# Patient Record
Sex: Female | Born: 1975
Health system: Southern US, Community
[De-identification: ages and names within clinical notes are randomized; demographics above are authoritative.]

## PROBLEM LIST (undated history)

## (undated) ENCOUNTER — Inpatient Hospital Stay (HOSPITAL_COMMUNITY): Payer: Self-pay

## (undated) DIAGNOSIS — B977 Papillomavirus as the cause of diseases classified elsewhere: Secondary | ICD-10-CM

## (undated) DIAGNOSIS — I499 Cardiac arrhythmia, unspecified: Secondary | ICD-10-CM

## (undated) DIAGNOSIS — E785 Hyperlipidemia, unspecified: Secondary | ICD-10-CM

## (undated) DIAGNOSIS — I10 Essential (primary) hypertension: Secondary | ICD-10-CM

## (undated) DIAGNOSIS — Z993 Dependence on wheelchair: Secondary | ICD-10-CM

## (undated) DIAGNOSIS — R2681 Unsteadiness on feet: Secondary | ICD-10-CM

## (undated) DIAGNOSIS — O039 Complete or unspecified spontaneous abortion without complication: Secondary | ICD-10-CM

## (undated) DIAGNOSIS — K219 Gastro-esophageal reflux disease without esophagitis: Secondary | ICD-10-CM

## (undated) DIAGNOSIS — I471 Supraventricular tachycardia, unspecified: Secondary | ICD-10-CM

## (undated) HISTORY — DX: Hyperlipidemia, unspecified: E78.5

## (undated) HISTORY — DX: Essential (primary) hypertension: I10

## (undated) HISTORY — PX: NM MYOCAR PERF WALL MOTION: HXRAD629

## (undated) HISTORY — DX: Gastro-esophageal reflux disease without esophagitis: K21.9

## (undated) HISTORY — PX: WISDOM TOOTH EXTRACTION: SHX21

## (undated) HISTORY — PX: SPINE SURGERY: SHX786

## (undated) HISTORY — DX: Papillomavirus as the cause of diseases classified elsewhere: B97.7

## (undated) HISTORY — DX: Supraventricular tachycardia, unspecified: I47.10

## (undated) HISTORY — DX: Unsteadiness on feet: R26.81

## (undated) HISTORY — DX: Supraventricular tachycardia: I47.1

---

## 1997-01-05 HISTORY — PX: BREAST SURGERY: SHX581

## 2000-03-07 ENCOUNTER — Emergency Department (HOSPITAL_COMMUNITY): Admission: EM | Admit: 2000-03-07 | Discharge: 2000-03-07 | Payer: Self-pay | Admitting: Emergency Medicine

## 2000-03-30 ENCOUNTER — Other Ambulatory Visit: Admission: RE | Admit: 2000-03-30 | Discharge: 2000-03-30 | Payer: Self-pay | Admitting: *Deleted

## 2002-10-01 ENCOUNTER — Emergency Department (HOSPITAL_COMMUNITY): Admission: EM | Admit: 2002-10-01 | Discharge: 2002-10-01 | Payer: Self-pay

## 2003-09-27 ENCOUNTER — Encounter: Admission: RE | Admit: 2003-09-27 | Discharge: 2003-12-26 | Payer: Self-pay | Admitting: Family Medicine

## 2004-02-12 ENCOUNTER — Emergency Department (HOSPITAL_COMMUNITY): Admission: EM | Admit: 2004-02-12 | Discharge: 2004-02-12 | Payer: Self-pay | Admitting: Family Medicine

## 2004-03-02 ENCOUNTER — Emergency Department (HOSPITAL_COMMUNITY): Admission: EM | Admit: 2004-03-02 | Discharge: 2004-03-02 | Payer: Self-pay | Admitting: Family Medicine

## 2004-06-18 ENCOUNTER — Other Ambulatory Visit: Admission: RE | Admit: 2004-06-18 | Discharge: 2004-06-18 | Payer: Self-pay | Admitting: Obstetrics and Gynecology

## 2004-09-15 ENCOUNTER — Emergency Department (HOSPITAL_COMMUNITY): Admission: EM | Admit: 2004-09-15 | Discharge: 2004-09-15 | Payer: Self-pay | Admitting: Family Medicine

## 2004-09-22 ENCOUNTER — Emergency Department (HOSPITAL_COMMUNITY): Admission: EM | Admit: 2004-09-22 | Discharge: 2004-09-22 | Payer: Self-pay | Admitting: Family Medicine

## 2004-12-22 ENCOUNTER — Emergency Department (HOSPITAL_COMMUNITY): Admission: EM | Admit: 2004-12-22 | Discharge: 2004-12-22 | Payer: Self-pay | Admitting: Emergency Medicine

## 2005-07-07 ENCOUNTER — Other Ambulatory Visit: Admission: RE | Admit: 2005-07-07 | Discharge: 2005-07-07 | Payer: Self-pay | Admitting: Obstetrics and Gynecology

## 2005-11-28 ENCOUNTER — Emergency Department (HOSPITAL_COMMUNITY): Admission: EM | Admit: 2005-11-28 | Discharge: 2005-11-28 | Payer: Self-pay | Admitting: Family Medicine

## 2007-01-07 ENCOUNTER — Emergency Department (HOSPITAL_COMMUNITY): Admission: EM | Admit: 2007-01-07 | Discharge: 2007-01-07 | Payer: Self-pay | Admitting: Family Medicine

## 2007-08-15 ENCOUNTER — Emergency Department (HOSPITAL_COMMUNITY): Admission: EM | Admit: 2007-08-15 | Discharge: 2007-08-16 | Payer: Self-pay | Admitting: Emergency Medicine

## 2007-12-03 ENCOUNTER — Emergency Department (HOSPITAL_COMMUNITY): Admission: EM | Admit: 2007-12-03 | Discharge: 2007-12-04 | Payer: Self-pay | Admitting: Emergency Medicine

## 2008-05-13 ENCOUNTER — Emergency Department (HOSPITAL_COMMUNITY): Admission: EM | Admit: 2008-05-13 | Discharge: 2008-05-13 | Payer: Self-pay | Admitting: Emergency Medicine

## 2008-05-23 ENCOUNTER — Emergency Department (HOSPITAL_COMMUNITY): Admission: EM | Admit: 2008-05-23 | Discharge: 2008-05-23 | Payer: Self-pay | Admitting: Family Medicine

## 2008-06-04 ENCOUNTER — Emergency Department (HOSPITAL_COMMUNITY): Admission: EM | Admit: 2008-06-04 | Discharge: 2008-06-04 | Payer: Self-pay | Admitting: Emergency Medicine

## 2008-06-12 ENCOUNTER — Emergency Department (HOSPITAL_COMMUNITY): Admission: EM | Admit: 2008-06-12 | Discharge: 2008-06-12 | Payer: Self-pay | Admitting: Emergency Medicine

## 2008-07-05 HISTORY — PX: DOPPLER ECHOCARDIOGRAPHY: SHX263

## 2008-07-10 ENCOUNTER — Emergency Department (HOSPITAL_COMMUNITY): Admission: EM | Admit: 2008-07-10 | Discharge: 2008-07-10 | Payer: Self-pay | Admitting: Family Medicine

## 2008-07-22 ENCOUNTER — Emergency Department (HOSPITAL_COMMUNITY): Admission: EM | Admit: 2008-07-22 | Discharge: 2008-07-22 | Payer: Self-pay | Admitting: Emergency Medicine

## 2008-08-19 ENCOUNTER — Emergency Department (HOSPITAL_COMMUNITY): Admission: EM | Admit: 2008-08-19 | Discharge: 2008-08-19 | Payer: Self-pay | Admitting: Emergency Medicine

## 2008-11-11 ENCOUNTER — Emergency Department (HOSPITAL_COMMUNITY): Admission: EM | Admit: 2008-11-11 | Discharge: 2008-11-11 | Payer: Self-pay | Admitting: Emergency Medicine

## 2009-01-05 ENCOUNTER — Emergency Department (HOSPITAL_COMMUNITY): Admission: EM | Admit: 2009-01-05 | Discharge: 2009-01-06 | Payer: Self-pay | Admitting: Emergency Medicine

## 2009-05-30 ENCOUNTER — Emergency Department (HOSPITAL_COMMUNITY)
Admission: EM | Admit: 2009-05-30 | Discharge: 2009-05-30 | Payer: Self-pay | Source: Home / Self Care | Admitting: Emergency Medicine

## 2009-06-04 ENCOUNTER — Encounter: Admission: RE | Admit: 2009-06-04 | Discharge: 2009-09-02 | Payer: Self-pay | Admitting: Internal Medicine

## 2009-07-12 DIAGNOSIS — E785 Hyperlipidemia, unspecified: Secondary | ICD-10-CM | POA: Insufficient documentation

## 2009-07-12 DIAGNOSIS — B977 Papillomavirus as the cause of diseases classified elsewhere: Secondary | ICD-10-CM | POA: Insufficient documentation

## 2009-07-22 ENCOUNTER — Encounter (INDEPENDENT_AMBULATORY_CARE_PROVIDER_SITE_OTHER): Payer: Self-pay | Admitting: *Deleted

## 2009-09-03 ENCOUNTER — Encounter: Admission: RE | Admit: 2009-09-03 | Discharge: 2009-10-04 | Payer: Self-pay | Admitting: Internal Medicine

## 2009-10-15 ENCOUNTER — Ambulatory Visit: Payer: Self-pay | Admitting: Internal Medicine

## 2009-10-15 DIAGNOSIS — G43909 Migraine, unspecified, not intractable, without status migrainosus: Secondary | ICD-10-CM | POA: Insufficient documentation

## 2009-10-15 DIAGNOSIS — E669 Obesity, unspecified: Secondary | ICD-10-CM | POA: Insufficient documentation

## 2009-10-15 DIAGNOSIS — K219 Gastro-esophageal reflux disease without esophagitis: Secondary | ICD-10-CM | POA: Insufficient documentation

## 2009-10-28 ENCOUNTER — Ambulatory Visit: Payer: Self-pay | Admitting: Internal Medicine

## 2009-10-28 LAB — CONVERTED CEMR LAB
BUN: 10 mg/dL (ref 6–23)
Basophils Relative: 0.4 % (ref 0.0–3.0)
Creatinine, Ser: 0.7 mg/dL (ref 0.4–1.2)
Eosinophils Relative: 1 % (ref 0.0–5.0)
Glucose, Bld: 83 mg/dL (ref 70–99)
HCT: 35.7 % — ABNORMAL LOW (ref 36.0–46.0)
HDL: 33.3 mg/dL — ABNORMAL LOW (ref >39.00)
Lymphocytes Relative: 27 % (ref 12.0–46.0)
MCHC: 34.4 g/dL (ref 30.0–36.0)
MCV: 97 fL (ref 78.0–100.0)
Monocytes Absolute: 0.4 10*3/uL (ref 0.1–1.0)
Platelets: 179 10*3/uL (ref 150.0–400.0)
RBC: 3.68 M/uL — ABNORMAL LOW (ref 3.87–5.11)
Sodium: 137 meq/L (ref 135–145)
Total CHOL/HDL Ratio: 4
Triglycerides: 43 mg/dL (ref 0.0–149.0)
VLDL: 8.6 mg/dL (ref 0.0–40.0)
WBC: 5.9 10*3/uL (ref 4.5–10.5)

## 2009-10-30 ENCOUNTER — Ambulatory Visit: Payer: Self-pay | Admitting: Internal Medicine

## 2009-10-30 LAB — CONVERTED CEMR LAB: Vit D, 25-Hydroxy: 22 ng/mL — ABNORMAL LOW (ref 30–89)

## 2009-11-04 ENCOUNTER — Encounter: Payer: Self-pay | Admitting: Internal Medicine

## 2009-11-26 IMAGING — CT CT HEAD W/O CM
1 series · 16 of 30 positions shown, 20 images · non-contrast
Comparison: None available.

CLINICAL DATA: Right facial numbness and tingling.

CT HEAD WITHOUT CONTRAST
TECHNIQUE: Contiguous axial images were obtained from the base of
the skull through the vertex without contrast.

[Series 2: head_seq 4.5 h37s st · axial · 0.43mm/px · z∈[+1134,+1278]mm · 16 of 36 slices shown, 20 images]
[im 2/36  brain]
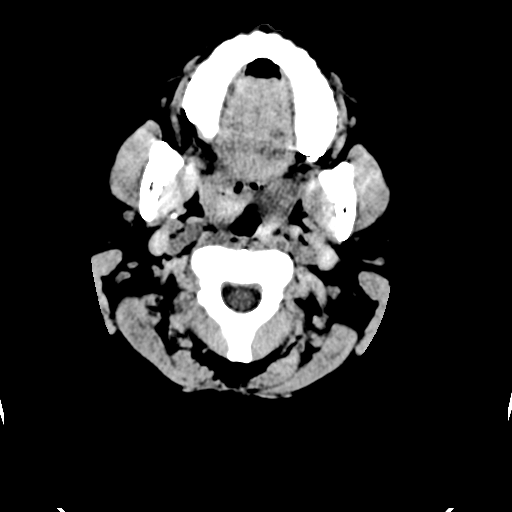
[im 2/36  bone]
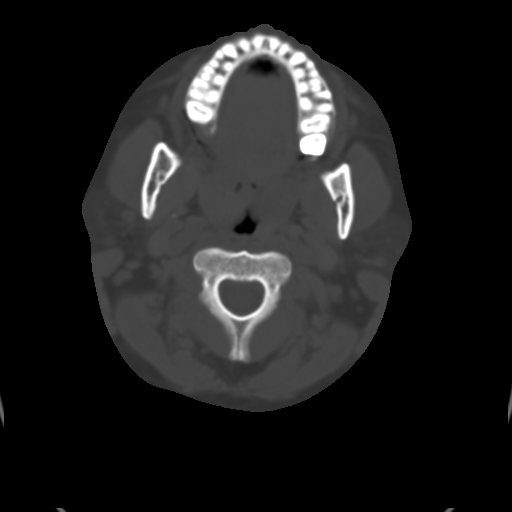
[im 4/36  brain]
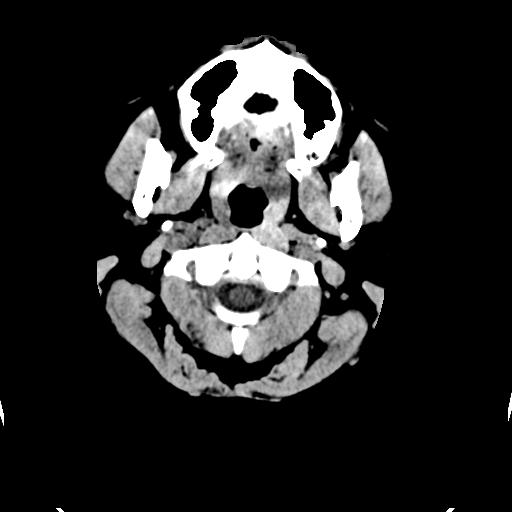
[im 7/36  brain]
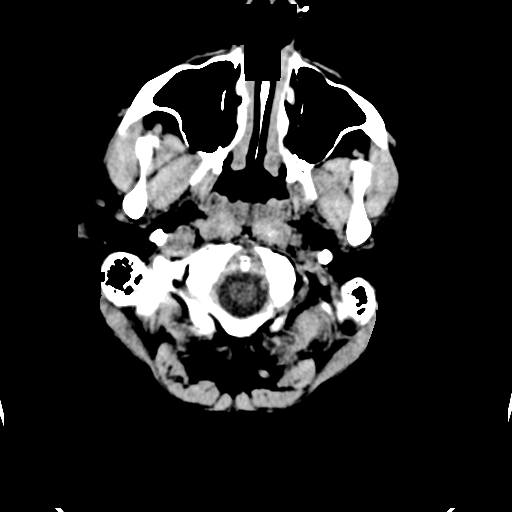
[im 9/36  brain]
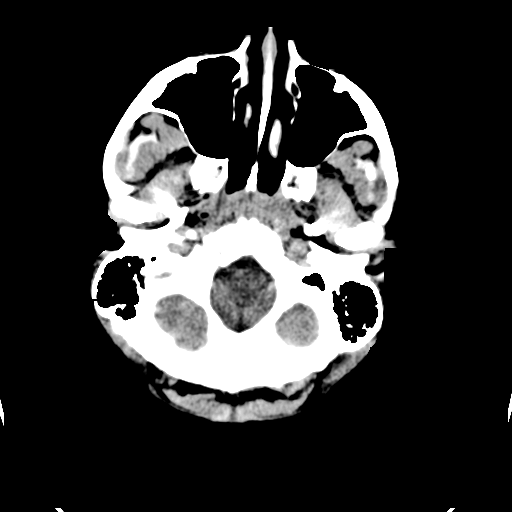
[im 10/36  brain]
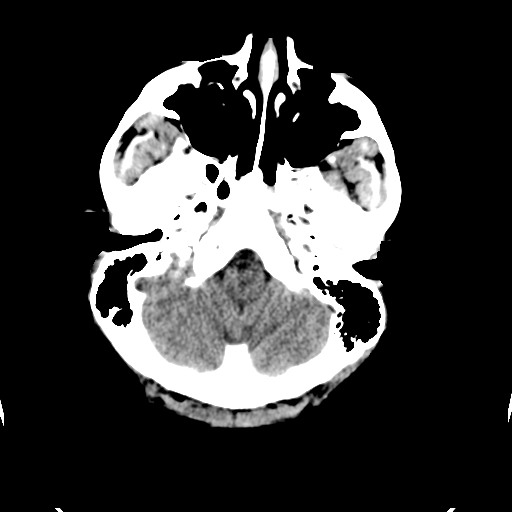
[im 10/36  bone]
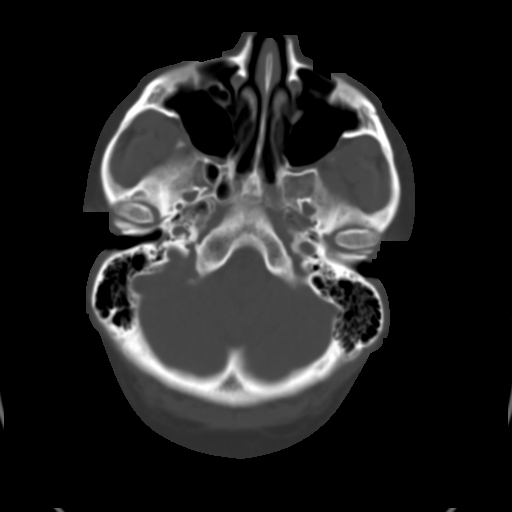
[im 13/36  brain]
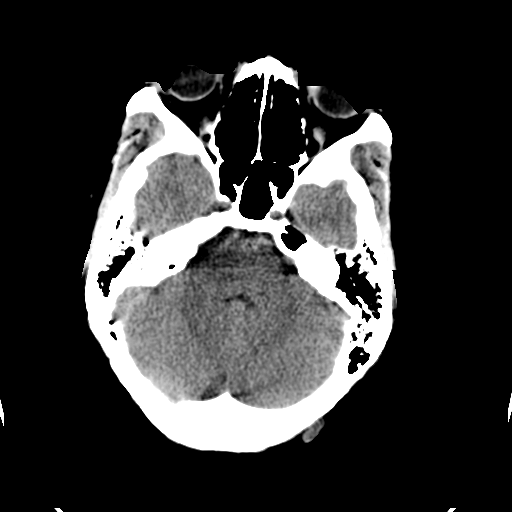
[im 15/36  brain]
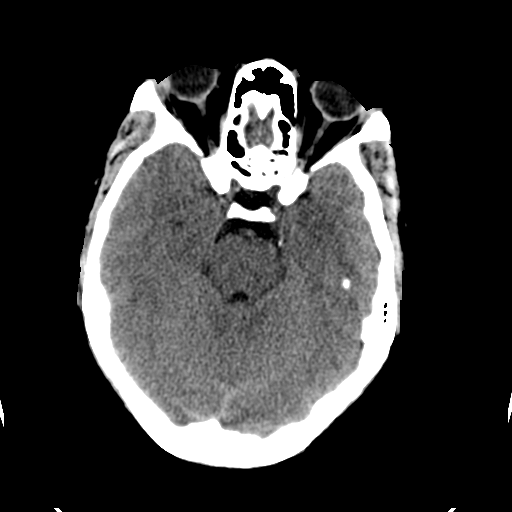
[im 17/36  brain]
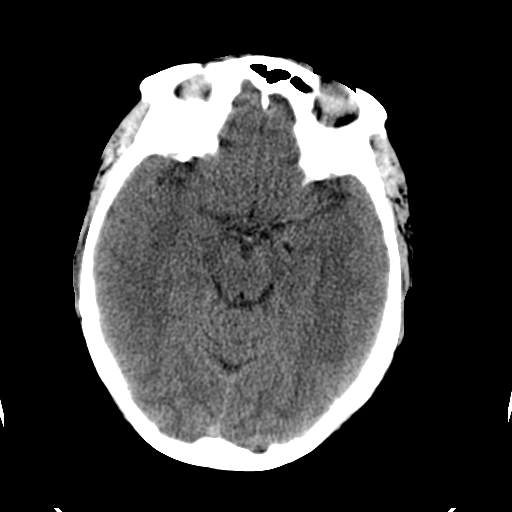
[im 19/36  brain]
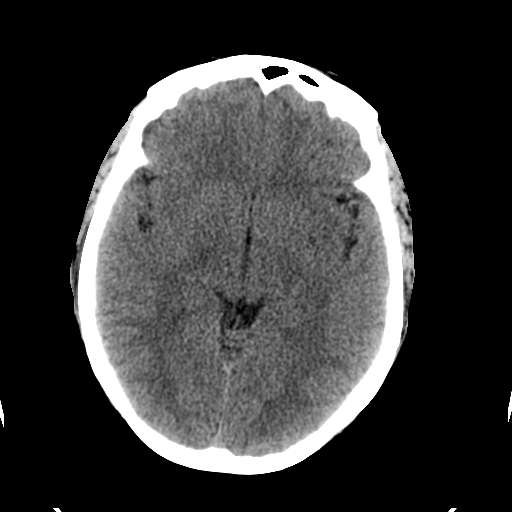
[im 19/36  bone]
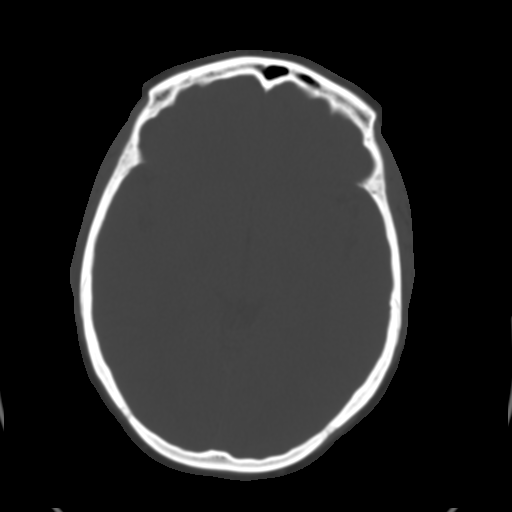
[im 21/36  brain]
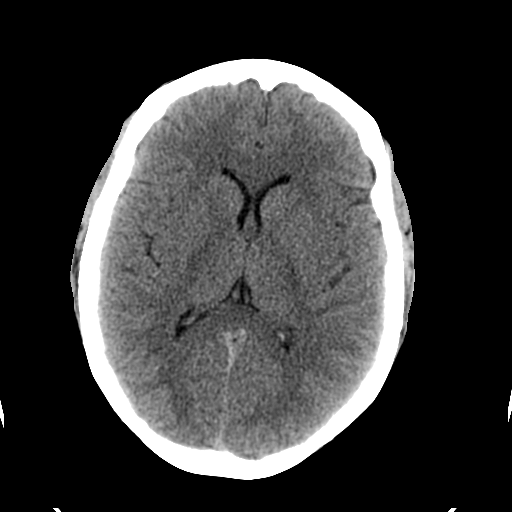
[im 23/36  brain]
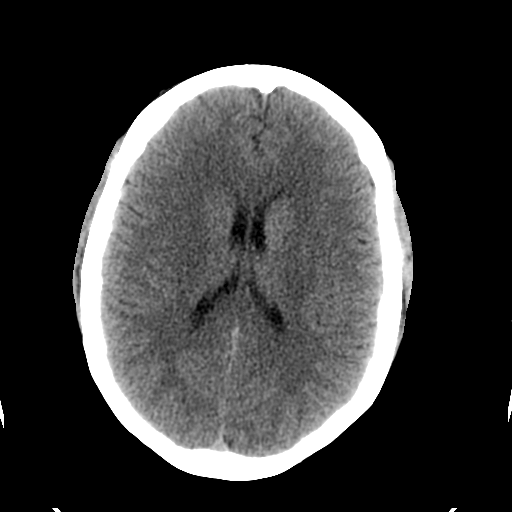
[im 26/36  brain]
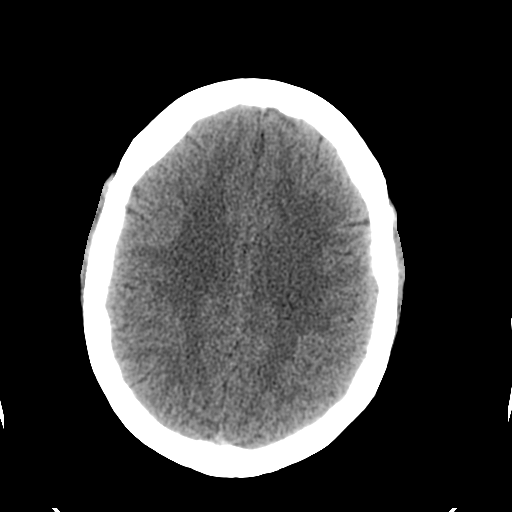
[im 27/36  brain]
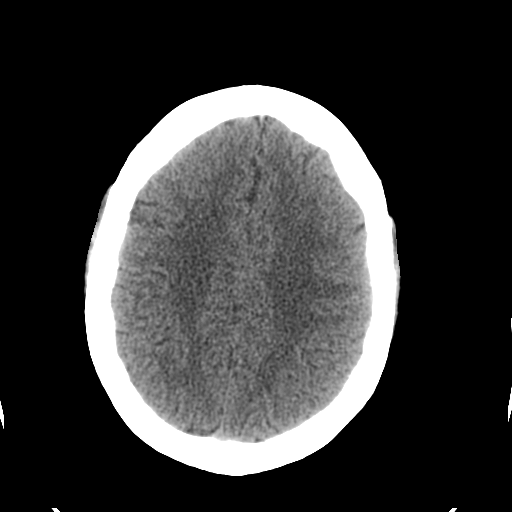
[im 27/36  bone]
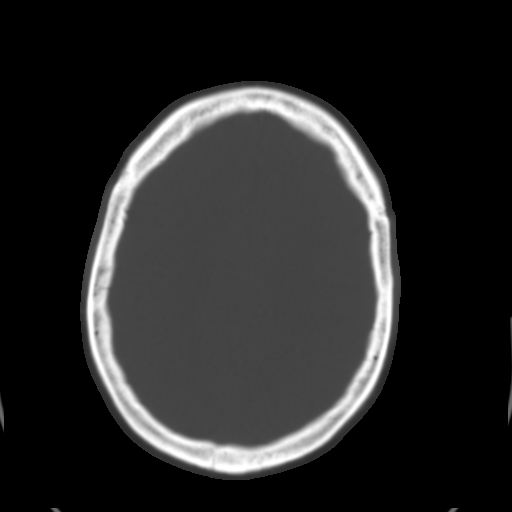
[im 29/36  brain]
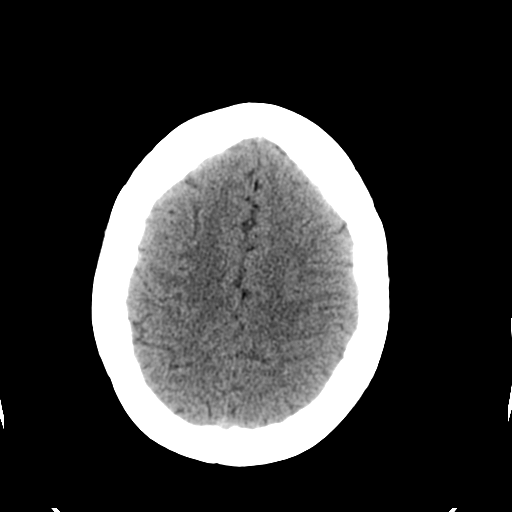
[im 32/36  brain]
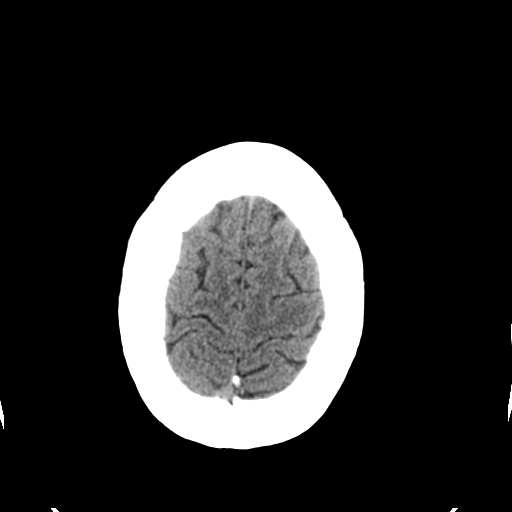
[im 34/36  brain]
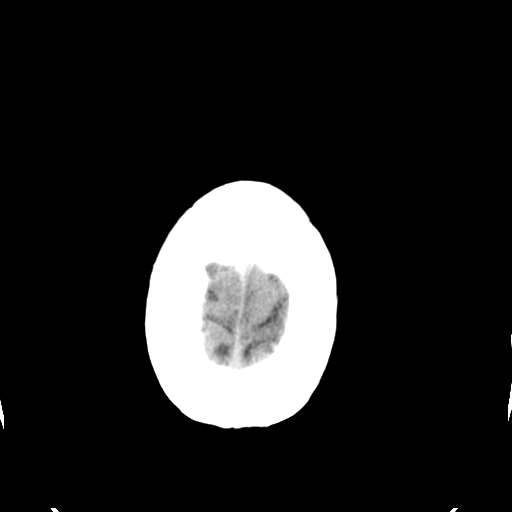

[16 of 30 positions shown; findings below may reference images not displayed]

FINDINGS: The brain appears normal without evidence of hemorrhage,
infarct, mass, mass effect, midline shift or abnormal extra-axial
fluid collection.  There is no hydrocephalus.  Imaged paranasal
sinuses and mastoid air cells are clear.  Calvarium is intact.
IMPRESSION: Negative head CT.

## 2010-01-13 ENCOUNTER — Telehealth: Payer: Self-pay | Admitting: Internal Medicine

## 2010-01-15 ENCOUNTER — Encounter: Payer: Self-pay | Admitting: Internal Medicine

## 2010-01-31 ENCOUNTER — Ambulatory Visit
Admission: RE | Admit: 2010-01-31 | Discharge: 2010-01-31 | Payer: Self-pay | Source: Home / Self Care | Attending: Internal Medicine | Admitting: Internal Medicine

## 2010-01-31 LAB — CONVERTED CEMR LAB
Bilirubin Urine: NEGATIVE
Protein, U semiquant: 30
Urobilinogen, UA: NEGATIVE

## 2010-02-06 NOTE — Progress Notes (Signed)
Summary: Note---SCAT  Phone Note Call from Patient   Caller: Patient Summary of Call: Pt is requesting a note stating she is in a wheelchair for SCAT --Pt ph#  [phone] Initial call taken by: Ivar Bury,  January 13, 2010 3:56 PM  Follow-up for Phone Call        done. letter a triage Follow-up by: Jacques Navy MD,  January 15, 2010 1:03 PM  Additional Follow-up for Phone Call Additional follow up Details #1::        Pt informed to pick up Additional Follow-up by: Lamar Sprinkles, CMA,  January 15, 2010 2:37 PM

## 2010-02-06 NOTE — Assessment & Plan Note (Signed)
Summary: urine leakage/?bladder inf/cd   Vital Signs:  Patient profile:   35 year old female Temp:     97.6 degrees F Pulse rate:   69 / minute BP sitting:   122 / 80  (left arm) Cuff size:   regular  Vitals Entered By: Lamar Sprinkles, CMA (January 31, 2010 3:06 PM) CC: Urinary frequency & leakage - has had eval by GYN   Primary Care Provider:  Illene Regulus  CC:  Urinary frequency & leakage - has had eval by GYN.  History of Present Illness: Patient presents due to increased urinary frequency  and some pressure discomfort at the flanks.She recently saw her Gyn Jan 2nd - negative u/a. Had pelvic U/S Jan 19th positive for small fibroid. She reports that after her pelvic exam she did have an increase in her symptoms. Denies F/S/C, hematuria, dysuria. Admits to frequency and nocturia.   Current Medications (verified): 1)  Crestor 10 Mg Tabs (Rosuvastatin Calcium) .Marland Kitchen.. 1 By Mouth Once Daily 2)  Metoprolol Tartrate 50 Mg Tabs (Metoprolol Tartrate) .Marland Kitchen.. 1 1/2 Tablets (75mg ) By Mouth Once Daily 3)  Vitamin D (Ergocalciferol) 50000 Unit Caps (Ergocalciferol) .Marland Kitchen.. 1 By Mouth Weekly X 8 Weeks  Allergies (verified): No Known Drug Allergies PMH-FH-SH reviewed-no changes except otherwise noted  Review of Systems       The patient complains of incontinence.  The patient denies anorexia, fever, abdominal pain, severe indigestion/heartburn, hematuria, muscle weakness, and enlarged lymph nodes.         slight urinary leakage  Physical Exam  General:  overweight AA female w/c bound Head:  normocephalic and atraumatic.   Eyes:  C&S clear Lungs:  normal respiratory effort.   Heart:  normal rate and regular rhythm.   Neurologic:  alert & oriented X3.   Skin:  turgor normal and color normal.   Psych:  Oriented X3 and memory intact for recent and remote.     Impression & Recommendations:  Problem # 1:  URGENCY OF URINATION (ZOX-096.04) U/A today is negative and she has no signs to  suggest UTI. suspect irritible bladder.  Plan - trail of Toviaz. If no help, trial of vesicare. If no help refer to GU  Complete Medication List: 1)  Crestor 10 Mg Tabs (Rosuvastatin calcium) .Marland Kitchen.. 1 by mouth once daily 2)  Metoprolol Tartrate 50 Mg Tabs (Metoprolol tartrate) .Marland Kitchen.. 1 1/2 tablets (75mg ) by mouth once daily 3)  Vitamin D (ergocalciferol) 50000 Unit Caps (Ergocalciferol) .Marland Kitchen.. 1 by mouth weekly x 8 weeks   Orders Added: 1)  Est. Patient Level III [99213]    Laboratory Results   Urine Tests   Date/Time Reported: Lamar Sprinkles, CMA  January 31, 2010 3:12 PM   Routine Urinalysis   Appearance: Clear Glucose: negative   (Normal Range: Negative) Bilirubin: negative   (Normal Range: Negative) Ketone: negative   (Normal Range: Negative) Spec. Gravity: <1.005   (Normal Range: 1.003-1.035) Blood: trace-lysed   (Normal Range: Negative) pH: 8.0   (Normal Range: 5.0-8.0) Protein: 30   (Normal Range: Negative) Urobilinogen: negative   (Normal Range: 0-1) Nitrite: negative   (Normal Range: Negative) Leukocyte Esterace: negative   (Normal Range: Negative)

## 2010-02-06 NOTE — Assessment & Plan Note (Signed)
Summary: PER MD 2 WK FU--STC   Vital Signs:  Patient profile:   35 year old female Height:      68 inches Weight:      225 pounds BMI:     34.33 O2 Sat:      98 % on Room air Temp:     98.0 degrees F oral Pulse rate:   66 / minute BP sitting:   106 / 70  (left arm) Cuff size:   large  Vitals Entered By: Bill Salinas CMA (October 30, 2009 11:05 AM)  O2 Flow:  Room air CC: follow-up visit/ab Comments Pt states that she takes Metoprolol Succ ER 50mg  1 1/2 tabs once a day   Primary Care Provider:  Illene Regulus  CC:  follow-up visit/ab.  History of Present Illness: Patient presents for follow-up. She did review her initial history with no additions or corrections. She is also her to review labs.   Current Medications (verified): 1)  Crestor 10 Mg Tabs (Rosuvastatin Calcium) .Marland Kitchen.. 1 By Mouth Once Daily 2)  Metoprolol Tartrate 50 Mg Tabs (Metoprolol Tartrate) .Marland Kitchen.. 1 1/2 Tablets (75mg ) By Mouth Once Daily  Allergies (verified): No Known Drug Allergies PMH-FH-SH reviewed-no changes except otherwise noted  Physical Exam  General:  Well-developed,well-nourished,in no acute distress; alert,appropriate and cooperative throughout examination Neck:  supple.   Lungs:  normal respiratory effort and normal breath sounds.   Heart:  normal rate and regular rhythm.   Abdomen:  soft and normal bowel sounds.     Impression & Recommendations:  Problem # 1:  OVERWEIGHT (ICD-278.02) strongly encouraged to sign up for water based exercise  Problem # 2:  HYPERLIPIDEMIA (ICD-272.4) Great control on crestor with LDL 101. HDL is low and this can be improved with low carb diet and exercise.   Plan - continue crestor.  Her updated medication list for this problem includes:    Crestor 10 Mg Tabs (Rosuvastatin calcium) .Marland Kitchen... 1 by mouth once daily  Problem # 3:  PSVT (ICD-427.0) Stable  Her updated medication list for this problem includes:    Metoprolol Tartrate 50 Mg Tabs (Metoprolol  tartrate) .Marland Kitchen... 1 1/2 tablets (75mg ) by mouth once daily  Complete Medication List: 1)  Crestor 10 Mg Tabs (Rosuvastatin calcium) .Marland Kitchen.. 1 by mouth once daily 2)  Metoprolol Tartrate 50 Mg Tabs (Metoprolol tartrate) .Marland Kitchen.. 1 1/2 tablets (75mg ) by mouth once daily  Patient: Sara Hunt Note: All result statuses are Final unless otherwise noted.  Tests: (1) Lipid Panel (LIPID)   Cholesterol               143 mg/dL                   1-610     ATP III Classification            Desirable:  < 200 mg/dL                    Borderline High:  200 - 239 mg/dL               High:  > = 240 mg/dL   Triglycerides             43.0 mg/dL                  9.6-045.4     Normal:  <150 mg/dL     Borderline High:  098 - 199 mg/dL   HDL                  [  L]  33.30 mg/dL                 >16.10   VLDL Cholesterol          8.6 mg/dL                   9.6-04.5   LDL Cholesterol      [H]  409 mg/dL                   8-11  CHO/HDL Ratio:  CHD Risk                             4                    Men          Women     1/2 Average Risk     3.4          3.3     Average Risk          5.0          4.4     2X Average Risk          9.6          7.1     3X Average Risk          15.0          11.0                           Tests: (2) TSH (TSH)   FastTSH                   0.99 uIU/mL                 0.35-5.50  Tests: (3) BMP (METABOL)   Sodium                    137 mEq/L                   135-145   Potassium                 3.9 mEq/L                   3.5-5.1   Chloride                  104 mEq/L                   96-112   Carbon Dioxide            30 mEq/L                    19-32   Glucose                   83 mg/dL                    91-47   BUN                       10 mg/dL                    8-29   Creatinine                0.7 mg/dL  0.4-1.2   Calcium                   8.9 mg/dL                   4.0-98.1   GFR                       129.89 mL/min               >60  Tests: (4) CBC Platelet  w/Diff (CBCD)   White Cell Count          5.9 K/uL                    4.5-10.5   Red Cell Count       [L]  3.68 Mil/uL                 3.87-5.11   Hemoglobin                12.3 g/dL                   19.1-47.8   Hematocrit           [L]  35.7 %                      36.0-46.0   MCV                       97.0 fl                     78.0-100.0   MCHC                      34.4 g/dL                   29.5-62.1   RDW                       11.9 %                      11.5-14.6   Platelet Count            179.0 K/uL                  150.0-400.0   Neutrophil %              64.7 %                      43.0-77.0   Lymphocyte %              27.0 %                      12.0-46.0   Monocyte %                6.9 %                       3.0-12.0   Eosinophils%              1.0 %                       0.0-5.0   Basophils %  0.4 %                       0.0-3.0   Neutrophill Absolute      3.8 K/uL                    1.4-7.7   Lymphocyte Absolute       1.6 K/uL                    0.7-4.0   Monocyte Absolute         0.4 K/uL                    0.1-1.0  Eosinophils, Absolute                             0.1 K/uL                    0.0-0.7   Basophils Absolute        0.0 K/uL                    0.0-0.1  Orders Added: 1)  Est. Patient Level III [16109]

## 2010-02-06 NOTE — Letter (Signed)
   [facility] [address] [place], [place]  [address] Phone: [phone]      November 04, 2009   [facility] [address] [place], [place] [address]  RE:  LAB RESULTS  Dear  Ms. Overbaugh,  The following is an interpretation of your most recent lab tests.  Please take note of any instructions provided or changes to medications that have resulted from your lab work.   Vitamin D level is 22 = D insufficient.   Plan is to tkae D3 50,000 units once a week for 8 weeks then 1000 international units daily for maintenance.  Please come see me if you have any questions about these lab results.   Medications Prescribed or Changed VITAMIN D (ERGOCALCIFEROL) 50000 UNIT CAPS (ERGOCALCIFEROL) 1 by mouth weekly x 8 weeks   Sincerely Yours,    Jacques Navy MD

## 2010-02-06 NOTE — Letter (Signed)
Summary: Generic Letter  [facility]  [address] [place], [place] [address]   Phone: [phone]  Fax: [phone]              01/15/2010 In reference to: [facility] [address] [place], [place]  [address]  To Whom It may concern,  Ms Kaman is wheelchair bound and qualifies for SCAT.  Sincerely,   Illene Regulus MD

## 2010-02-06 NOTE — Letter (Signed)
Summary: Appointment - Missed  [facility], Main Office  [address]   [place], [place] [address]   Phone: [phone]  Fax: [phone]     July 22, 2009 MRN: [id number]   [facility] [address] [place], [place]  [address]   Dear Ms. Travieso,  Our records indicate you missed your appointment on 07/15/09 with Dr Johney Frame. It is very important that we reach you to reschedule this appointment. We look forward to participating in your health care needs. Please contact us at the number listed above at your earliest convenience to reschedule this appointment.     Sincerely,   Ruel Favors Scheduling Team
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
## 2010-03-22 LAB — DIFFERENTIAL
Eosinophils Absolute: 0.1 10*3/uL (ref 0.0–0.7)
Monocytes Relative: 7 % (ref 3–12)
Neutro Abs: 4.3 10*3/uL (ref 1.7–7.7)
Neutrophils Relative %: 58 % (ref 43–77)

## 2010-03-22 LAB — COMPREHENSIVE METABOLIC PANEL
AST: 17 U/L (ref 0–37)
Albumin: 3.2 g/dL — ABNORMAL LOW (ref 3.5–5.2)
Calcium: 9.5 mg/dL (ref 8.4–10.5)
Creatinine, Ser: 0.73 mg/dL (ref 0.4–1.2)
GFR calc Af Amer: >60 mL/min (ref >60)
Potassium: 3.7 mEq/L (ref 3.5–5.1)

## 2010-03-22 LAB — POCT I-STAT, CHEM 8
BUN: 12 mg/dL (ref 6–23)
HCT: 37 % (ref 36.0–46.0)
Hemoglobin: 12.6 g/dL (ref 12.0–15.0)
Potassium: 3.6 mEq/L (ref 3.5–5.1)

## 2010-03-22 LAB — POCT CARDIAC MARKERS
CKMB, poc: 1.8 ng/mL (ref 1.0–8.0)
Myoglobin, poc: 99.7 ng/mL (ref 12–200)

## 2010-03-22 LAB — CBC
Hemoglobin: 12.6 g/dL (ref 12.0–15.0)
RDW: 11.8 % (ref 11.5–15.5)

## 2010-03-24 LAB — BASIC METABOLIC PANEL
CO2: 27 mEq/L (ref 19–32)
Glucose, Bld: 97 mg/dL (ref 70–99)
Potassium: 3.7 mEq/L (ref 3.5–5.1)

## 2010-03-24 LAB — DIFFERENTIAL
Basophils Relative: 0 % (ref 0–1)
Eosinophils Absolute: 0.1 10*3/uL (ref 0.0–0.7)
Eosinophils Relative: 1 % (ref 0–5)
Lymphocytes Relative: 22 % (ref 12–46)
Monocytes Absolute: 0.5 10*3/uL (ref 0.1–1.0)
Monocytes Relative: 6 % (ref 3–12)

## 2010-03-24 LAB — CBC
HCT: 39.8 % (ref 36.0–46.0)
Hemoglobin: 13.6 g/dL (ref 12.0–15.0)
MCHC: 34.1 g/dL (ref 30.0–36.0)
RDW: 11.9 % (ref 11.5–15.5)

## 2010-03-24 LAB — APTT: aPTT: 28 seconds (ref 24–37)

## 2010-03-24 LAB — TROPONIN I: Troponin I: 0.01 ng/mL (ref 0.00–0.06)

## 2010-03-24 LAB — PROTIME-INR
INR: 0.97 (ref 0.00–1.49)
Prothrombin Time: 12.8 seconds (ref 11.6–15.2)

## 2010-03-24 LAB — CK TOTAL AND CKMB (NOT AT ARMC): Relative Index: 0.9 (ref 0.0–2.5)

## 2010-04-09 LAB — URINALYSIS, ROUTINE W REFLEX MICROSCOPIC
Bilirubin Urine: NEGATIVE
Glucose, UA: NEGATIVE mg/dL
Ketones, ur: NEGATIVE mg/dL
Leukocytes, UA: NEGATIVE
Nitrite: NEGATIVE
Protein, ur: NEGATIVE mg/dL
Specific Gravity, Urine: 1.011 (ref 1.005–1.030)
Urobilinogen, UA: 0.2 mg/dL (ref 0.0–1.0)
pH: 5 (ref 5.0–8.0)

## 2010-04-09 LAB — RPR: RPR Ser Ql: NONREACTIVE

## 2010-04-09 LAB — URINE MICROSCOPIC-ADD ON

## 2010-04-09 LAB — WET PREP, GENITAL
Clue Cells Wet Prep HPF POC: NONE SEEN
Trich, Wet Prep: NONE SEEN
WBC, Wet Prep HPF POC: NONE SEEN
Yeast Wet Prep HPF POC: NONE SEEN

## 2010-04-09 LAB — GC/CHLAMYDIA PROBE AMP, GENITAL
Chlamydia, DNA Probe: NEGATIVE
GC Probe Amp, Genital: NEGATIVE

## 2010-04-09 LAB — PREGNANCY, URINE: Preg Test, Ur: NEGATIVE

## 2010-04-12 LAB — WET PREP, GENITAL
Trich, Wet Prep: NONE SEEN
WBC, Wet Prep HPF POC: NONE SEEN
Yeast Wet Prep HPF POC: NONE SEEN

## 2010-04-12 LAB — POCT URINALYSIS DIP (DEVICE)
Glucose, UA: NEGATIVE mg/dL
Ketones, ur: NEGATIVE mg/dL
Protein, ur: NEGATIVE mg/dL
Urobilinogen, UA: 1 mg/dL (ref 0.0–1.0)

## 2010-04-12 LAB — POCT PREGNANCY, URINE: Preg Test, Ur: NEGATIVE

## 2010-04-12 LAB — HERPES SIMPLEX VIRUS CULTURE

## 2010-04-13 LAB — POCT I-STAT, CHEM 8
BUN: 7 mg/dL (ref 6–23)
Calcium, Ion: 1.23 mmol/L (ref 1.12–1.32)
Creatinine, Ser: 0.8 mg/dL (ref 0.4–1.2)
TCO2: 27 mmol/L (ref 0–100)

## 2010-04-13 LAB — COMPREHENSIVE METABOLIC PANEL
ALT: 16 U/L (ref 0–35)
AST: 20 U/L (ref 0–37)
Albumin: 3.2 g/dL — ABNORMAL LOW (ref 3.5–5.2)
Alkaline Phosphatase: 52 U/L (ref 39–117)
Chloride: 106 mEq/L (ref 96–112)
Creatinine, Ser: 0.83 mg/dL (ref 0.4–1.2)
GFR calc Af Amer: >60 mL/min (ref >60)
Potassium: 3.5 mEq/L (ref 3.5–5.1)
Sodium: 137 mEq/L (ref 135–145)
Total Bilirubin: 0.2 mg/dL — ABNORMAL LOW (ref 0.3–1.2)

## 2010-04-13 LAB — DIFFERENTIAL
Basophils Absolute: 0 10*3/uL (ref 0.0–0.1)
Eosinophils Relative: 1 % (ref 0–5)
Lymphocytes Relative: 25 % (ref 12–46)
Monocytes Absolute: 0.4 10*3/uL (ref 0.1–1.0)

## 2010-04-13 LAB — POCT URINALYSIS DIP (DEVICE)
Glucose, UA: NEGATIVE mg/dL
Nitrite: NEGATIVE
Specific Gravity, Urine: 1.01 (ref 1.005–1.030)
Urobilinogen, UA: 0.2 mg/dL (ref 0.0–1.0)

## 2010-04-13 LAB — CBC
Platelets: 187 10*3/uL (ref 150–400)
WBC: 7.2 10*3/uL (ref 4.0–10.5)

## 2010-04-13 LAB — POCT CARDIAC MARKERS: Myoglobin, poc: 119 ng/mL (ref 12–200)

## 2010-04-13 LAB — URINE CULTURE

## 2010-04-13 LAB — POCT PREGNANCY, URINE: Preg Test, Ur: NEGATIVE

## 2010-04-14 LAB — POCT I-STAT, CHEM 8
Hemoglobin: 14.6 g/dL (ref 12.0–15.0)
Sodium: 139 mEq/L (ref 135–145)
TCO2: 24 mmol/L (ref 0–100)

## 2010-04-14 LAB — CBC
MCHC: 34.1 g/dL (ref 30.0–36.0)
MCV: 96.5 fL (ref 78.0–100.0)
Platelets: 185 10*3/uL (ref 150–400)
RDW: 12.1 % (ref 11.5–15.5)

## 2010-04-14 LAB — DIFFERENTIAL
Basophils Relative: 1 % (ref 0–1)
Eosinophils Absolute: 0.1 10*3/uL (ref 0.0–0.7)
Neutrophils Relative %: 70 % (ref 43–77)

## 2010-04-14 LAB — D-DIMER, QUANTITATIVE: D-Dimer, Quant: 0.43 ug/mL-FEU (ref 0.00–0.48)

## 2010-04-15 LAB — WET PREP, GENITAL
Trich, Wet Prep: NONE SEEN
Yeast Wet Prep HPF POC: NONE SEEN
Yeast Wet Prep HPF POC: NONE SEEN

## 2010-04-15 LAB — URINE MICROSCOPIC-ADD ON

## 2010-04-15 LAB — URINALYSIS, ROUTINE W REFLEX MICROSCOPIC
Bilirubin Urine: NEGATIVE
Glucose, UA: NEGATIVE mg/dL
Ketones, ur: NEGATIVE mg/dL
Leukocytes, UA: NEGATIVE
pH: 5.5 (ref 5.0–8.0)

## 2010-04-15 LAB — GC/CHLAMYDIA PROBE AMP, GENITAL
Chlamydia, DNA Probe: NEGATIVE
Chlamydia, DNA Probe: NEGATIVE

## 2010-04-29 ENCOUNTER — Telehealth: Payer: Self-pay | Admitting: *Deleted

## 2010-04-29 NOTE — Telephone Encounter (Signed)
Patient requesting RX for wheelchair cushion.

## 2010-04-30 NOTE — Telephone Encounter (Signed)
Ready,Patient informed

## 2010-04-30 NOTE — Telephone Encounter (Signed)
done

## 2010-05-16 ENCOUNTER — Telehealth: Payer: Self-pay | Admitting: *Deleted

## 2010-05-16 MED ORDER — ROSUVASTATIN CALCIUM 10 MG PO TABS: 10.0000 mg | ORAL_TABLET | Freq: Every day | ORAL | Status: DC

## 2010-05-16 NOTE — Telephone Encounter (Signed)
Rx Done . 

## 2010-06-09 ENCOUNTER — Other Ambulatory Visit: Payer: Self-pay | Admitting: Internal Medicine

## 2010-06-16 ENCOUNTER — Encounter: Payer: Self-pay | Admitting: Internal Medicine

## 2010-06-17 ENCOUNTER — Ambulatory Visit (INDEPENDENT_AMBULATORY_CARE_PROVIDER_SITE_OTHER): Payer: Medicare Other | Admitting: Internal Medicine

## 2010-06-17 VITALS — BP 102/72 | HR 63 | Temp 97.9°F

## 2010-06-17 DIAGNOSIS — M79606 Pain in leg, unspecified: Secondary | ICD-10-CM

## 2010-06-17 DIAGNOSIS — M79609 Pain in unspecified limb: Secondary | ICD-10-CM

## 2010-06-17 NOTE — Progress Notes (Signed)
  Subjective:    Patient ID: Sara Hunt, female    DOB: 09-22-1975, 35 y.o.   MRN: [id number]  HPI Ms. Riehle presents due to intermittent tingling in the distal left LE that will last for seconds. She is worried about possible blood clot. She also c/o DOE, a little more than usual but no chest pain. She has had no pleuritic pain, no sense of impending doom, no leg pain.  PMH, FamHx and SocHx reviewed for any changes and relevance.    Review of Systems Review of Systems  Constitutional:  Negative for fever, chills, activity change and unexpected weight change.  HEENT:  Negative for hearing loss, ear pain, congestion, neck stiffness and postnasal drip. Negative for sore throat or swallowing problems. Negative for dental complaints.   Eyes: Negative for vision loss or change in visual acuity.  Respiratory: Negative for chest tightness and wheezing.   Cardiovascular: Negative for chest pain and palpitationNo decreased exercise tolerance Gastrointestinal: No change in bowel habit. No bloating or gas. No reflux or indigestion Genitourinary: Negative for urgency, frequency, flank pain and difficulty urinating.  Musculoskeletal: Negative for myalgias, back pain, arthralgias and gait problem.  Neurological: Negative for dizziness, tremors, weakness and headaches.  Hematological: Negative for adenopathy.  Psychiatric/Behavioral: Negative for behavioral problems and dysphoric mood.       Objective:   Physical Exam Vitals noted including O2 sat of 97% Chest - normal breath sounds, no wheeze. Cor - RRR Ext - legs are equal in size, no left calve tenderness to palpation, negative Homan's sign.        Assessment & Plan:  1. Leg pain - minor intermittent paresthesia. Nothing in history or on exam to suggest DVT. Pt reassured.  2. DOE - normal exam, normal oxygen saturation.   No further w/u at this time.

## 2010-09-04 ENCOUNTER — Other Ambulatory Visit (INDEPENDENT_AMBULATORY_CARE_PROVIDER_SITE_OTHER): Payer: Medicare Other

## 2010-09-04 ENCOUNTER — Ambulatory Visit (INDEPENDENT_AMBULATORY_CARE_PROVIDER_SITE_OTHER): Payer: Medicare Other | Admitting: Internal Medicine

## 2010-09-04 VITALS — BP 118/80 | HR 64 | Temp 97.5°F

## 2010-09-04 DIAGNOSIS — R5383 Other fatigue: Secondary | ICD-10-CM

## 2010-09-04 DIAGNOSIS — E559 Vitamin D deficiency, unspecified: Secondary | ICD-10-CM

## 2010-09-04 DIAGNOSIS — R5381 Other malaise: Secondary | ICD-10-CM

## 2010-09-04 DIAGNOSIS — Z79899 Other long term (current) drug therapy: Secondary | ICD-10-CM

## 2010-09-04 LAB — HEMOGLOBIN A1C: Hgb A1c MFr Bld: 5 % (ref 4.6–6.5)

## 2010-09-07 DIAGNOSIS — E559 Vitamin D deficiency, unspecified: Secondary | ICD-10-CM | POA: Insufficient documentation

## 2010-09-07 LAB — VITAMIN D 1,25 DIHYDROXY: Vitamin D2 1, 25 (OH)2: 9 pg/mL

## 2010-09-07 NOTE — Assessment & Plan Note (Signed)
Patient with recurrent symptoms of Vit D deficiency.   Plan - lab: Vit D level; ionized serum calcium and iPTH level.  If low will need further endocrine w/u and possibly referral.

## 2010-09-07 NOTE — Progress Notes (Signed)
  Subjective:    Patient ID: Sara Hunt, female    DOB: Sep 17, 1975, 35 y.o.   MRN: [id number]  HPI Sara Hunt presents with recurrent malaise and atigue. She reports that these are symptoms c/w previous episodes of low Vit D. She had been on long term high dose replacement. It seems taht when replacement is stopped she will develop symptoms. She has not had further endocrine evaluation.  I have reviewed the patient's medical history in detail and updated the computerized patient record.    Review of Systems System review is negative for any constitutional, cardiac, pulmonary, GI or neuro symptoms or complaints     Objective:   Physical Exam Vitals - stable Gen'l - very pleasant w/c bound AA woman in no distress HEENT - C&S clear Resp - normal Cor - RRR       Assessment & Plan:

## 2010-09-08 ENCOUNTER — Encounter: Payer: Self-pay | Admitting: Internal Medicine

## 2010-09-24 LAB — URINE CULTURE

## 2010-09-24 LAB — POCT URINALYSIS DIP (DEVICE)
Ketones, ur: NEGATIVE
Nitrite: NEGATIVE
Operator id: 200941
Protein, ur: NEGATIVE
Urobilinogen, UA: 1

## 2010-09-24 LAB — POCT PREGNANCY, URINE: Preg Test, Ur: NEGATIVE

## 2010-10-15 ENCOUNTER — Other Ambulatory Visit: Payer: Self-pay | Admitting: Internal Medicine

## 2010-10-15 ENCOUNTER — Telehealth: Payer: Self-pay | Admitting: *Deleted

## 2010-10-15 ENCOUNTER — Other Ambulatory Visit: Payer: Self-pay | Admitting: *Deleted

## 2010-10-15 MED ORDER — METOPROLOL SUCCINATE ER 50 MG PO TB24: 50.0000 mg | ORAL_TABLET | Freq: Every day | ORAL | Status: DC

## 2010-10-15 MED ORDER — METOPROLOL SUCCINATE ER 50 MG PO TB24: 75.0000 mg | ORAL_TABLET | Freq: Every day | ORAL | Status: DC

## 2010-10-15 NOTE — Telephone Encounter (Signed)
Yep. 45 would have been consistent with 1 .5 tabs daily

## 2010-10-15 NOTE — Telephone Encounter (Signed)
Pharm called, we sent in RX for Metoprolol 50 mg 1 once daily #45. Pt has been getting RX for 1 and 1/2 tablets once daily. OK for updated sig?

## 2010-10-15 NOTE — Telephone Encounter (Signed)
Sent in new rx 

## 2010-10-16 ENCOUNTER — Other Ambulatory Visit: Payer: Self-pay | Admitting: *Deleted

## 2010-10-16 MED ORDER — METOPROLOL SUCCINATE ER 50 MG PO TB24: 50.0000 mg | ORAL_TABLET | Freq: Every day | ORAL | Status: DC

## 2010-11-17 ENCOUNTER — Telehealth: Payer: Self-pay | Admitting: *Deleted

## 2010-11-17 NOTE — Telephone Encounter (Signed)
Pt called and wanted lab work done to check for STDs. Pt states she has not had unprotected sex since April or may '12. She just wants "to double check" to make sure she doesn't have any std. Pt had gyn MD in the past but provider no longer takes her insurance. I informed pt that she would need an office visit to discuss this with Dr Debby Bud so he could do appropriate testing. Pt agree and I sent to Harri[facility] Sine to set up appt.

## 2010-11-26 ENCOUNTER — Ambulatory Visit: Payer: Medicare Other | Admitting: Internal Medicine

## 2011-01-12 ENCOUNTER — Other Ambulatory Visit: Payer: Self-pay | Admitting: *Deleted

## 2011-01-12 MED ORDER — ROSUVASTATIN CALCIUM 10 MG PO TABS: 10.0000 mg | ORAL_TABLET | Freq: Every day | ORAL | Status: DC

## 2011-01-12 NOTE — Telephone Encounter (Signed)
Done

## 2011-02-05 ENCOUNTER — Telehealth: Payer: Self-pay | Admitting: *Deleted

## 2011-02-05 DIAGNOSIS — Z0000 Encounter for general adult medical examination without abnormal findings: Secondary | ICD-10-CM

## 2011-02-05 DIAGNOSIS — N926 Irregular menstruation, unspecified: Secondary | ICD-10-CM

## 2011-02-05 NOTE — Telephone Encounter (Signed)
Ok for [facility] labs, ok for serum, quantitative pregnancy test - diagnosis is missed period. Need an indication (reason) for Vit D assay in a young person.

## 2011-02-05 NOTE — Telephone Encounter (Signed)
Vitamin D level in August was 44 - robust. If she has continued to take vit d daily there is no need to repeat a vitamin D level.

## 2011-02-05 NOTE — Telephone Encounter (Signed)
Pt states that she is two weeks late with her menstrual cycle and has been experiencing fatigue and some bloating . Pt wants lab work done to check  Vitamin D levels because of fatigueand also a blood pregnancy test as well as CPX labs since she has an upcoming CPX appointment in February.

## 2011-02-05 NOTE — Telephone Encounter (Signed)
Orders placed for CPX labs and serum pregnancy test. Pt had Vitamin D labs done at last OV (08/2010) and has dx of Vitamin D defiency in problem list-okay to add lab for Vitamin D

## 2011-02-11 ENCOUNTER — Other Ambulatory Visit (INDEPENDENT_AMBULATORY_CARE_PROVIDER_SITE_OTHER): Payer: Medicare Other

## 2011-02-11 DIAGNOSIS — N926 Irregular menstruation, unspecified: Secondary | ICD-10-CM

## 2011-02-11 DIAGNOSIS — E785 Hyperlipidemia, unspecified: Secondary | ICD-10-CM

## 2011-02-11 DIAGNOSIS — Z0000 Encounter for general adult medical examination without abnormal findings: Secondary | ICD-10-CM

## 2011-02-11 LAB — URINALYSIS, ROUTINE W REFLEX MICROSCOPIC
Bilirubin Urine: NEGATIVE
Leukocytes, UA: NEGATIVE
Nitrite: NEGATIVE
Specific Gravity, Urine: 1.02 (ref 1.000–1.030)
Total Protein, Urine: NEGATIVE
pH: 6 (ref 5.0–8.0)

## 2011-02-11 LAB — BASIC METABOLIC PANEL
CO2: 28 mEq/L (ref 19–32)
Calcium: 9.1 mg/dL (ref 8.4–10.5)
Chloride: 105 mEq/L (ref 96–112)
Creatinine, Ser: 0.7 mg/dL (ref 0.4–1.2)
Glucose, Bld: 87 mg/dL (ref 70–99)

## 2011-02-11 LAB — HEPATIC FUNCTION PANEL
ALT: 11 U/L (ref 0–35)
Albumin: 3.3 g/dL — ABNORMAL LOW (ref 3.5–5.2)
Alkaline Phosphatase: 50 U/L (ref 39–117)
Total Protein: 7.5 g/dL (ref 6.0–8.3)

## 2011-02-11 LAB — TSH: TSH: 1.2 u[IU]/mL (ref 0.35–5.50)

## 2011-02-11 LAB — CBC WITH DIFFERENTIAL/PLATELET
Eosinophils Relative: 1.1 % (ref 0.0–5.0)
HCT: 36.2 % (ref 36.0–46.0)
Hemoglobin: 12.4 g/dL (ref 12.0–15.0)
Lymphs Abs: 2.2 10*3/uL (ref 0.7–4.0)
Monocytes Relative: 5.1 % (ref 3.0–12.0)
Neutro Abs: 3.4 10*3/uL (ref 1.4–7.7)
RBC: 3.79 Mil/uL — ABNORMAL LOW (ref 3.87–5.11)
WBC: 6.1 10*3/uL (ref 4.5–10.5)

## 2011-02-11 LAB — LIPID PANEL
HDL: 37.1 mg/dL — ABNORMAL LOW (ref >39.00)
Triglycerides: 45 mg/dL (ref 0.0–149.0)

## 2011-02-11 LAB — HCG, QUANTITATIVE, PREGNANCY: hCG, Beta Chain, Quant, S: 0.47 m[IU]/mL

## 2011-02-12 ENCOUNTER — Telehealth: Payer: Self-pay | Admitting: *Deleted

## 2011-02-12 NOTE — Telephone Encounter (Signed)
Left msg on vm had labs done yesterday & pregnancy test pt is requesting results... 02/12/11@3 :57pm/LMB

## 2011-02-12 NOTE — Telephone Encounter (Signed)
Called patient - negative pregnancy test, all labs normal

## 2011-02-15 ENCOUNTER — Encounter: Payer: Self-pay | Admitting: Internal Medicine

## 2011-02-25 ENCOUNTER — Ambulatory Visit (INDEPENDENT_AMBULATORY_CARE_PROVIDER_SITE_OTHER): Payer: Medicare Other | Admitting: Internal Medicine

## 2011-02-25 ENCOUNTER — Encounter: Payer: Self-pay | Admitting: Internal Medicine

## 2011-02-25 DIAGNOSIS — K219 Gastro-esophageal reflux disease without esophagitis: Secondary | ICD-10-CM

## 2011-02-25 DIAGNOSIS — E785 Hyperlipidemia, unspecified: Secondary | ICD-10-CM

## 2011-02-25 DIAGNOSIS — E663 Overweight: Secondary | ICD-10-CM

## 2011-02-25 DIAGNOSIS — Z0000 Encounter for general adult medical examination without abnormal findings: Secondary | ICD-10-CM

## 2011-02-25 DIAGNOSIS — I471 Supraventricular tachycardia: Secondary | ICD-10-CM

## 2011-02-25 MED ORDER — METOPROLOL SUCCINATE ER 50 MG PO TB24: 50.0000 mg | ORAL_TABLET | Freq: Every day | ORAL | Status: DC
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
## 2011-02-27 DIAGNOSIS — Z0000 Encounter for general adult medical examination without abnormal findings: Secondary | ICD-10-CM | POA: Insufficient documentation

## 2011-02-27 NOTE — Assessment & Plan Note (Signed)
Weight is a major health issue for her and she is aware of this. She does wish to loose weight. Discussed weight management for long term results: smart food choices, PORTION SIZE CONTROL with the meal of 1,000 chews, regular aerobic exercise.   Plan Target weight - 180 lbs         Goal - to loose 1 -2 lbs per month ( a 3-5 year project!!!)

## 2011-02-27 NOTE — Assessment & Plan Note (Signed)
Lab reveals excellent control with LDL better than goal of 130 or less. She is tolerating Crestor w/o adverse effects.  Plan - continue present medication.

## 2011-02-27 NOTE — Assessment & Plan Note (Signed)
Generally stable on present medical regimen. No changes proposed.

## 2011-02-27 NOTE — Assessment & Plan Note (Signed)
Stable at this time 

## 2011-02-27 NOTE — Assessment & Plan Note (Signed)
Interval medical history is stable and unremarkable. Limited physical exam is normal. Lab results are excellent with good control of lipids and normal liver function. She is current with gyn care. She is current with tetanus immunization.  In summary - a very nice woman who is continuing to better herself. She is encouraged in her exercise program and in her dedication to weight management. She will return in 1 year or sooner as needed.

## 2011-04-07 ENCOUNTER — Encounter: Payer: Self-pay | Admitting: *Deleted

## 2011-04-07 ENCOUNTER — Encounter: Payer: Medicare Other | Attending: Internal Medicine | Admitting: *Deleted

## 2011-04-07 VITALS — Ht 68.0 in | Wt 241.0 lb

## 2011-04-07 DIAGNOSIS — E785 Hyperlipidemia, unspecified: Secondary | ICD-10-CM

## 2011-04-07 DIAGNOSIS — Z713 Dietary counseling and surveillance: Secondary | ICD-10-CM | POA: Insufficient documentation

## 2011-04-07 DIAGNOSIS — E663 Overweight: Secondary | ICD-10-CM | POA: Insufficient documentation
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
## 2011-04-07 NOTE — Patient Instructions (Addendum)
Plan: Aim for 3 Carb Choices (45 grams) per meal, +/- 1 either way Aim for 0-2 Carb Choices per snack if hungry Limit Fat to 10 grams or less per meal Read Food Labels for Total Carb and Fat grams of foods Resume membership at [facility] and increase activity 30 minutes per day as tolerated Consider APPs for phone; Calorie Brooke Dare and / or Lose It as additional resources to increase awareness of nutrient intake

## 2011-04-16 ENCOUNTER — Encounter: Payer: Self-pay | Admitting: Internal Medicine

## 2011-04-16 ENCOUNTER — Ambulatory Visit (INDEPENDENT_AMBULATORY_CARE_PROVIDER_SITE_OTHER): Payer: Medicare Other | Admitting: Internal Medicine

## 2011-04-16 VITALS — BP 118/80 | HR 65 | Temp 97.1°F

## 2011-04-16 DIAGNOSIS — M25511 Pain in right shoulder: Secondary | ICD-10-CM

## 2011-04-16 DIAGNOSIS — M25519 Pain in unspecified shoulder: Secondary | ICD-10-CM

## 2011-04-16 MED ORDER — HYDROCODONE-ACETAMINOPHEN 2.5-325 MG PO TABS: 1.0000 | ORAL_TABLET | Freq: Four times a day (QID) | ORAL | Status: DC | PRN

## 2011-04-16 MED ORDER — HYDROCODONE-ACETAMINOPHEN 5-325 MG PO TABS: 1.0000 | ORAL_TABLET | Freq: Four times a day (QID) | ORAL | Status: AC | PRN

## 2011-04-16 NOTE — Patient Instructions (Signed)
Take all new medications as prescribed Continue all other medications as before You will be contacted regarding the referral for: [facility] orthopedic
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
## 2011-04-17 ENCOUNTER — Telehealth: Payer: Self-pay

## 2011-04-17 NOTE — Telephone Encounter (Signed)
Sorry, I decline to repeat the prescription  If the sister has taken her actual medication, this is against the law, and pt should report this to police  We can refill the medication only if provided with police report that medication has been stolen

## 2011-04-17 NOTE — Telephone Encounter (Signed)
Called the patient to pickup letter at front desk. The medication that was given at the OV (04/16/11) was hydrocodone. The patient states her sister now has the prescription. Informed the patient to have her sister fill for her. She stated she could not and the patient would like a new prescription faxed to her pharmacy. Please advise

## 2011-04-17 NOTE — Telephone Encounter (Signed)
Patient would like a work note for yesterday's office visit. Also she was unable to take prescription given at OV to pharmacy and can the prescription be sent in. Please advise

## 2011-04-17 NOTE — Telephone Encounter (Signed)
Ok for note, ok for med per robin to pharmacy

## 2011-04-18 ENCOUNTER — Encounter: Payer: Self-pay | Admitting: Internal Medicine

## 2011-04-18 NOTE — Assessment & Plan Note (Signed)
C/w mod to severe bursitis, possible biecp tendonitis, cant r/o rot cuff dz - for ortho referral, pain control,  to f/u any worsening symptoms or concerns

## 2011-04-20 NOTE — Telephone Encounter (Signed)
Called informed the patient of MD's instructions. The patient stated the medication has been filled.

## 2011-06-17 ENCOUNTER — Encounter: Payer: Self-pay | Admitting: Endocrinology

## 2011-06-17 ENCOUNTER — Ambulatory Visit (INDEPENDENT_AMBULATORY_CARE_PROVIDER_SITE_OTHER): Payer: Medicare Other | Admitting: Endocrinology

## 2011-06-17 VITALS — BP 124/70 | HR 64 | Temp 98.0°F

## 2011-06-17 DIAGNOSIS — M25519 Pain in unspecified shoulder: Secondary | ICD-10-CM

## 2011-06-17 DIAGNOSIS — M25512 Pain in left shoulder: Secondary | ICD-10-CM

## 2011-06-17 NOTE — Patient Instructions (Addendum)
Refer to an orthopedic specialist.  you will receive a phone call, about a day and time for an appointment 
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
## 2011-06-22 ENCOUNTER — Telehealth: Payer: Self-pay | Admitting: *Deleted

## 2011-06-22 NOTE — Telephone Encounter (Signed)
Left message on home # and work # that called and will call back at 2pm when comes to work #

## 2011-06-22 NOTE — Telephone Encounter (Signed)
CALLED PATIENT STATES SHE IS CURRENTLY ON HER WAY TO ORHOPEDIC DOCTOR. DR. Loralee Pacas.

## 2011-06-22 NOTE — Telephone Encounter (Signed)
Message copied by Elnora Morrison on Mon Jun 22, 2011 10:39 AM ------      Message from: Jacques Navy      Created: Sun Jun 21, 2011 10:25 PM       Saw Dr. Everardo All Friday. If arm/shoulder is still painful we can add to schedule - reexamine and consider steroid injection

## 2011-06-22 NOTE — Telephone Encounter (Signed)
Message copied by Elnora Morrison on Mon Jun 22, 2011  8:18 AM ------      Message from: Jacques Navy      Created: Sun Jun 21, 2011 10:25 PM       Saw Dr. Everardo All Friday. If arm/shoulder is still painful we can add to schedule - reexamine and consider steroid injection

## 2011-10-08 ENCOUNTER — Other Ambulatory Visit: Payer: Self-pay | Admitting: Internal Medicine

## 2011-10-21 ENCOUNTER — Telehealth: Payer: Self-pay | Admitting: Internal Medicine

## 2011-10-21 ENCOUNTER — Ambulatory Visit: Payer: Medicare Other | Admitting: Internal Medicine

## 2011-10-21 NOTE — Telephone Encounter (Signed)
Caller: [name]/Patient; Patient Name: Sara Hunt; PCP: Illene Regulus (Adults only); Best Callback Phone Number: [phone].  Pt calling today 10/21/11 regarding has been having headaches which started around 10/18/11, went to her eye doctor today and they told her that her BP was 159/81. Pt does not have a diagnosis of HTN at this time.  Has been having some blurred vision at night.  Emergent symptoms r/o by Hypertension Diagnosed or Suspected guidelines with exception of single elevated blood pressure reading and has questions.  (See Provider Within 24 Hours, nursing judgment used).  Appt scheduled for today at 4:30 PM with Dr. Debby Bud.

## 2011-10-22 ENCOUNTER — Ambulatory Visit (INDEPENDENT_AMBULATORY_CARE_PROVIDER_SITE_OTHER): Payer: Medicare Other | Admitting: Internal Medicine

## 2011-10-22 ENCOUNTER — Encounter: Payer: Self-pay | Admitting: Internal Medicine

## 2011-10-22 VITALS — BP 128/76 | HR 77 | Temp 98.2°F | Resp 16 | Wt 239.0 lb

## 2011-10-22 DIAGNOSIS — E663 Overweight: Secondary | ICD-10-CM

## 2011-10-22 DIAGNOSIS — I10 Essential (primary) hypertension: Secondary | ICD-10-CM

## 2011-10-22 NOTE — Patient Instructions (Addendum)
Blood pressure - not a problem. BP good today and has been good. BP Readings from Last 3 Encounters:  10/22/11 128/76  06/17/11 124/70  04/16/11 118/80   Sinus congestion - no signs of infection. Suspect allergy as the cause. Plan - behind the counter pseudoephedrine 30 mg twice a day  Nasal saline wash/spray  Call for fever, nasty snot.
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
## 2011-10-25 DIAGNOSIS — I10 Essential (primary) hypertension: Secondary | ICD-10-CM | POA: Insufficient documentation

## 2011-10-25 NOTE — Assessment & Plan Note (Signed)
Wt Readings from Last 3 Encounters:  10/22/11 239 lb (108.41 kg)  04/07/11 241 lb (109.317 kg)  02/25/11 241 lb (109.317 kg)   She has lost a few pounds which was the plan - slow steady weight loss  Plan  Continue weight management.

## 2011-10-25 NOTE — Assessment & Plan Note (Signed)
Patient had excursion of BP at dentist office. She has not previously been diagnosed with hypertension but she does take a Beta Blocker, presumably to help regulate PSVT. Thus, it is difficult to know if there is underlying hypertension.He BP in the office has been well controlled.  Plan She is reassured about her BP  Continue BB

## 2011-10-26 ENCOUNTER — Encounter: Payer: Self-pay | Admitting: Internal Medicine

## 2011-10-29 ENCOUNTER — Emergency Department (HOSPITAL_COMMUNITY)
Admission: EM | Admit: 2011-10-29 | Discharge: 2011-10-30 | Disposition: A | Discharge status: Home / Self Care | Payer: Medicare Other | Attending: Emergency Medicine | Admitting: Emergency Medicine

## 2011-10-29 ENCOUNTER — Telehealth: Payer: Self-pay | Admitting: Internal Medicine

## 2011-10-29 ENCOUNTER — Encounter (HOSPITAL_COMMUNITY): Payer: Self-pay | Admitting: *Deleted

## 2011-10-29 DIAGNOSIS — E785 Hyperlipidemia, unspecified: Secondary | ICD-10-CM | POA: Insufficient documentation

## 2011-10-29 DIAGNOSIS — I471 Supraventricular tachycardia, unspecified: Secondary | ICD-10-CM | POA: Insufficient documentation

## 2011-10-29 DIAGNOSIS — Z87891 Personal history of nicotine dependence: Secondary | ICD-10-CM | POA: Insufficient documentation

## 2011-10-29 DIAGNOSIS — G43909 Migraine, unspecified, not intractable, without status migrainosus: Secondary | ICD-10-CM | POA: Insufficient documentation

## 2011-10-29 DIAGNOSIS — H538 Other visual disturbances: Secondary | ICD-10-CM | POA: Insufficient documentation

## 2011-10-29 DIAGNOSIS — B977 Papillomavirus as the cause of diseases classified elsewhere: Secondary | ICD-10-CM | POA: Insufficient documentation

## 2011-10-29 DIAGNOSIS — K219 Gastro-esophageal reflux disease without esophagitis: Secondary | ICD-10-CM | POA: Insufficient documentation

## 2011-10-29 DIAGNOSIS — R51 Headache: Secondary | ICD-10-CM | POA: Insufficient documentation

## 2011-10-29 NOTE — Telephone Encounter (Signed)
Caller: [name]/Patient; Patient Name: Sara Hunt; PCP: Illene Regulus (Adults only); Best Callback Phone Number: [phone].  Pt. is calling to report vision changes and headache.  Pt. has been awakening with blurred vision, which resolves after 1-2 minutes of awakeness. Onset 03/2011.   Pt. now reports new onset of headaches upon awakening, that resolves within 20 minutes of being awake. Onset 3 weeks ago (10/06/11).   She will have occasional intermittent headaches during the daytime.  She is concerned about the increased frequency of her headaches.  Triaged with Both Eye:  Vision changes, and Headache and the Disposition for both Guidelines are:  See Provider within 2 weeks.  Appointment scheduled for Monday 11/02/11 with Dr. Debby Bud for evalutaion.  Home care instructions given.  CAN/db.

## 2011-10-29 NOTE — ED Notes (Signed)
Pt reports developing a migraine headache x 7 days. States headache has gotten progressively worse and came to the ER for further evaluation. Pt reports not taking any meds for the headache. Pt reports blurry vision with the headache. Pt reports headache pain 10/10. Pt also reports high blood with no BP meds. Pt reports seeing spots in both eyes when she wakes up. Went to opthamologist who did not find anything wrong with her eyes. Concerned about blurry vision and severe occipital head pain.

## 2011-10-30 ENCOUNTER — Emergency Department (HOSPITAL_COMMUNITY): Payer: Medicare Other

## 2011-10-30 LAB — GLUCOSE, CAPILLARY: Glucose-Capillary: 88 mg/dL (ref 70–99)

## 2011-10-30 LAB — URINALYSIS, ROUTINE W REFLEX MICROSCOPIC
Glucose, UA: NEGATIVE mg/dL
Leukocytes, UA: NEGATIVE
Nitrite: NEGATIVE
Specific Gravity, Urine: 1.023 (ref 1.005–1.030)
pH: 5.5 (ref 5.0–8.0)

## 2011-10-30 LAB — URINE MICROSCOPIC-ADD ON

## 2011-10-30 MED ORDER — ACETAMINOPHEN 325 MG PO TABS
975.0000 mg | ORAL_TABLET | Freq: Once | ORAL | Status: AC
  Administered 2011-10-30: 975 mg via ORAL
  Filled 2011-10-30: qty 3
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
## 2011-10-30 NOTE — ED Notes (Signed)
[id number]<AV> Expected date:<BR> Expected time:<BR> Means of arrival:<BR> Comments:<BR> Hold for bed 7

## 2011-10-30 NOTE — ED Notes (Signed)
Urine sample sent to lab for holding.  ?

## 2011-10-30 NOTE — ED Notes (Signed)
Pt states "she started having floaters in Feb and f/u with her PCP and eye doctor. BP was slightly elevated and they stated she may have been having migraines. Symptoms resolved with Lopressor. Now over past 2 weeks they have returned along with headaches and some neck discomfort at times." States she is scared that something maybe going on since the symptoms are not gradual but sudden. Denies dizziness, and blurred vision at this time.

## 2011-11-02 ENCOUNTER — Ambulatory Visit (INDEPENDENT_AMBULATORY_CARE_PROVIDER_SITE_OTHER): Payer: Medicare Other | Admitting: Internal Medicine

## 2011-11-02 DIAGNOSIS — H538 Other visual disturbances: Secondary | ICD-10-CM

## 2011-11-02 DIAGNOSIS — R51 Headache: Secondary | ICD-10-CM
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
## 2011-11-02 NOTE — Patient Instructions (Addendum)
Blurred vision with Headache on awakening: normal CT exam. Physical exam is normal and the Blood pressure today is great. The eye exam was normal. Although bothersome this does not seem dangerous or severe.  Plan  Take 1,000 mg tylenol at bedtime  Check you BP first thing in the AM  For continued symptoms and normal BP in the AM will need to reassess.

## 2011-11-03 ENCOUNTER — Encounter: Payer: Self-pay | Admitting: Internal Medicine

## 2011-12-15 ENCOUNTER — Telehealth: Payer: Self-pay | Admitting: Internal Medicine

## 2011-12-15 NOTE — Telephone Encounter (Signed)
Patient Information:  Caller Name: Mayra  Phone: [phone]  Patient: Sara Hunt, Sara Hunt  Gender: Female  DOB: 01/17/1975  Age: 36 Years  PCP: Illene Regulus (Adults only)  Pregnant: No   Symptoms  Reason For Call & Symptoms: blood pressures running high.  BP 140/93 for past 10 days.  Reviewed Health History In EMR: Yes  Reviewed Medications In EMR: Yes  Reviewed Allergies In EMR: Yes  Reviewed Surgeries / Procedures: Yes  Date of Onset of Symptoms: 12/05/2011 OB:  LMP: Unknown  Guideline(s) Used:  High Blood Pressure  Disposition Per Guideline:   See Within 2 Weeks in Office  Reason For Disposition Reached:   BP > 140/90 and is not taking BP medications  Advice Given:  N/A  Office Follow Up:  Does the office need to follow up with this patient?: No  Instructions For The Office: N/A  Appointment Scheduled:  12/17/2011 00:01:00 Appointment Scheduled Provider:  Illene Regulus (Adults only)  RN Note:  Patient c/o headache on arising AM for past 10 days.  Has been checking her BP on a regular basis and it is running 140/93 or so, consistently.  Was told by Dr. Debby Bud to call back if continued to run elevated BP.  States occasionally she wakes up and sees a black spot for a couple of minutes, but this resolves quickly.  Per protocol, emergent symptoms denied; advised appt.  Patient agrees to appt; caller transferred to office to schedule appt.  krs/can

## 2011-12-16 ENCOUNTER — Ambulatory Visit (INDEPENDENT_AMBULATORY_CARE_PROVIDER_SITE_OTHER): Payer: Medicare Other | Admitting: Internal Medicine

## 2011-12-16 ENCOUNTER — Encounter: Payer: Self-pay | Admitting: Internal Medicine

## 2011-12-16 VITALS — BP 126/84 | HR 61 | Temp 97.0°F

## 2011-12-16 DIAGNOSIS — G479 Sleep disorder, unspecified: Secondary | ICD-10-CM

## 2011-12-16 DIAGNOSIS — I10 Essential (primary) hypertension: Secondary | ICD-10-CM

## 2011-12-16 MED ORDER — METOPROLOL-HYDROCHLOROTHIAZIDE 50-25 MG PO TABS: 1.0000 | ORAL_TABLET | Freq: Every day | ORAL | Status: DC

## 2011-12-16 NOTE — Patient Instructions (Signed)

## 2011-12-16 NOTE — Progress Notes (Signed)
Subjective:    Patient ID: Sara Hunt, female    DOB: 02/23/1975, 36 y.o.   MRN: [id number]  HPI  Pt presents to the clinic today with c/o high blood pressure. She says that her blood pressure is ranging from 130-140/90. In the morning, she is waking up with headaches and blurred vision. She has had this evaluated by Dr. Debby Bud and her opthomologist and both exams were normal.  She is on Metoprolol. She has been taking it daily. Additionally, she does c/o of trouble sleeping. She says that she wakes up frequently during the night feeling like she is choking and with a fast HR. She is usually sleepy all day long. She is requesting a referral for a sleep study.  Review of Systems      Past Medical History  Diagnosis Date  . Migraine   . GERD (gastroesophageal reflux disease)   . Hyperlipidemia   . HPV in female   . PSVT (paroxysmal supraventricular tachycardia)     Current Outpatient Prescriptions  Medication Sig Dispense Refill  . metoprolol succinate (TOPROL-XL) 50 MG 24 hr tablet Take 1 tablet (50 mg total) by mouth daily.  90 tablet  3  . rosuvastatin (CRESTOR) 10 MG tablet Take 10 mg by mouth daily.        No Known Allergies  Family History  Problem Relation Age of Onset  . Breast cancer Mother   . Hypertension Mother   . Diabetes Mother   . Hyperlipidemia Mother   . Hypertension Father   . Colon cancer Neg Hx   . Stroke Neg Hx   . Cancer Neg Hx     colon  . Hypertension Other   . Hyperlipidemia Other     History   Social History  . Marital Status: Single    Spouse Name: N/A    Number of Children: 0  . Years of Education: 16   Occupational History  . 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 Smoking status: Former Smoker    Quit date: 02/24/1994  . Smokeless tobacco: Never Used  . Alcohol Use: No  . Drug Use: No  . Sexually Active: Not Currently   Other Topics Concern  . 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 Lives alone.     Constitutional: Pt reports headaches in the morning. Denies fever, malaise, fatigue or abrupt weight changes.  HEENT: Pt reports blurred vision in the morning that last 1-2 minutes. Denies eye pain, eye redness, ear pain, ringing in the ears. Respiratory: Denies difficulty breathing, shortness of breath, cough or sputum production.   Cardiovascular: Pt reports swelling in the legs bilaterally. Denies chest pain, chest tightness, palpitations.  Neurological: Denies dizziness, difficulty with memory, difficulty with speech or problems with balance and coordination.   No other specific complaints in a complete review of systems (except as listed in HPI above).  Objective:   Physical Exam   BP 126/84  Pulse 61  Temp 97 F (36.1 C) (Oral)  SpO2 99%  LMP 12/02/2011 Wt Readings from Last 3 Encounters:  10/29/11 237 lb (107.502 kg)  10/22/11 239 lb (108.41 kg)  04/07/11 241 lb (109.317 kg)    General: Appears her stated age, obese but well developed, well nourished in NAD. Cardiovascular: Normal rate and rhythm.  S1,S2 noted.  No murmur, rubs or gallops noted. No JVD or BLE edema. No carotid bruits noted. 2+ pretibial edema noted. Pulmonary/Chest: Normal effort and positive vesicular breath sounds. No respiratory distress. No wheezes, rales or ronchi noted.  Neurological: Alert and oriented. Cranial nerves II-XII intact. Coordination normal. +DTRs bilaterally.   BMET    Component Value Date/Time   NA 138 02/11/2011 1042   K 3.9 02/11/2011 1042   CL 105 02/11/2011 1042   CO2 28 02/11/2011 1042   GLUCOSE 87 02/11/2011 1042   BUN 13 02/11/2011 1042   CREATININE 0.7 02/11/2011 1042   CALCIUM 9.1 02/11/2011 1042   CALCIUM 9.1 09/04/2010 1533   GFRNONAA 129.89 10/28/2009 1148   GFRAA  Value: >60        The eGFR has been calculated using the MDRD equation.  This calculation has not been validated in all clinical situations. eGFR's persistently <60 mL/min signify possible Chronic Kidney Disease. 05/30/2009 1105    Lipid Panel     Component Value Date/Time   CHOL 148 02/11/2011 1042   TRIG 45.0 02/11/2011 1042   HDL 37.10* 02/11/2011 1042   CHOLHDL 4 02/11/2011 1042   VLDL 9.0 02/11/2011 1042   LDLCALC 102* 02/11/2011 1042    CBC    Component Value Date/Time   WBC 6.1 02/11/2011 1042   RBC 3.79* 02/11/2011 1042   HGB 12.4 02/11/2011 1042   HCT 36.2 02/11/2011 1042   PLT 214.0 02/11/2011 1042   MCV 95.5 02/11/2011 1042   MCHC 34.1 02/11/2011 1042   RDW 12.2 02/11/2011 1042   LYMPHSABS 2.2 02/11/2011 1042   MONOABS 0.3 02/11/2011 1042   EOSABS 0.1 02/11/2011 1042   BASOSABS 0.0 02/11/2011 1042    Hgb A1C Lab Results  Component Value Date   HGBA1C 5.0 09/04/2010        Assessment & Plan:   Hypertension:  Reassurance given. Stop Toprol XL and start Lopressor HCT daily Pt instructed to monitor blood pressures daily and bring in to next visit.  Sleep disturbances, new onset with additional workup required:  Will order sleep study  RTC in 3 weeks for bp check

## 2011-12-31 ENCOUNTER — Telehealth: Payer: Self-pay | Admitting: Internal Medicine

## 2011-12-31 NOTE — Telephone Encounter (Signed)
Patient is calling because the Lopressor is causing her heart to flutter at night and she is wondering if she can go back to taking her normal metroplol and then taking a water pill at night to help with the heart flutters

## 2012-01-01 ENCOUNTER — Other Ambulatory Visit: Payer: Self-pay | Admitting: Internal Medicine

## 2012-01-01 DIAGNOSIS — I10 Essential (primary) hypertension: Secondary | ICD-10-CM

## 2012-01-01 MED ORDER — METOPROLOL SUCCINATE ER 50 MG PO TB24: 50.0000 mg | ORAL_TABLET | Freq: Every day | ORAL | Status: DC

## 2012-01-01 MED ORDER — HYDROCHLOROTHIAZIDE 25 MG PO TABS: 25.0000 mg | ORAL_TABLET | Freq: Every day | ORAL | Status: DC

## 2012-01-01 NOTE — Telephone Encounter (Signed)
Patient informed, voiced understanding.  °

## 2012-01-01 NOTE — Telephone Encounter (Signed)
[place], I have discontinued her Lopressr HCL and put her back on her Toprol XL daily. I have prescribed a separate HCTZ 25 mg to be taken daily but I would not advise her to take it at night because it will cause her to get up multiple times during the night to urinate. Rene Kocher

## 2012-01-14 ENCOUNTER — Ambulatory Visit (HOSPITAL_BASED_OUTPATIENT_CLINIC_OR_DEPARTMENT_OTHER): Payer: Medicare Other | Attending: Internal Medicine | Admitting: Radiology

## 2012-01-14 VITALS — Ht 68.0 in | Wt 238.0 lb

## 2012-01-14 DIAGNOSIS — G4733 Obstructive sleep apnea (adult) (pediatric): Secondary | ICD-10-CM | POA: Insufficient documentation

## 2012-01-14 DIAGNOSIS — I10 Essential (primary) hypertension: Secondary | ICD-10-CM

## 2012-01-18 ENCOUNTER — Telehealth: Payer: Self-pay | Admitting: Internal Medicine

## 2012-01-18 NOTE — Telephone Encounter (Signed)
Pt is calling regarding the fluid pill she has been taking (HCTZ).  Pt states on 12/11 she was changed to Lopressor HCL for blood pressure/regulate fluid.  Pt states the medication made her heart feel like it was fluttering so she called the office back and on 12/26 she was changed back to original blood pressure medication (Toprol XL) and then was to take the HCTZ separate.  Pt states since she has been on the medication she continues to feel as if her heart is fluttering and she has noticed a decrease in her urine output. Pt feels it is related to the HCTZ; she actually did not take the medication on 01/17/12 and felt fine (no fluttering and urine output was normal for her).  Pt also states the change in medication was supposed to be until her sleep study which she had done on 01/14/12.  Pt wants to know if it is ok that she stops the medication or if a new medication needs to be prescribed. OFFICE PLEASE FOLLOW UP WITH PATIENT

## 2012-01-19 NOTE — Telephone Encounter (Signed)
1. Sleep study results not yet available 2. HCTZ very, very unusual to cause palpitations or urinary retention (usually the opposite)  For persistent symptoms and for BP follow up - OV

## 2012-01-20 ENCOUNTER — Telehealth: Payer: Self-pay | Admitting: *Deleted

## 2012-01-20 NOTE — Telephone Encounter (Signed)
Called pt and left a detailed voice message that her sleep study results not yet available and HCTZ very, very unusual to cause palpitations or urinary retention it  (usually the opposite). For persistent symptoms and for BP follow up she needs an OV.

## 2012-01-27 DIAGNOSIS — G4710 Hypersomnia, unspecified: Secondary | ICD-10-CM

## 2012-01-27 DIAGNOSIS — G4730 Sleep apnea, unspecified: Secondary | ICD-10-CM

## 2012-01-27 NOTE — Procedures (Cosign Needed)
Sara Hunt, Sara Hunt NO.:  [id number]  MEDICAL RECORD NO.:  [id number]          PATIENT TYPE:  OUT  LOCATION:  SLEEP CENTER                 FACILITY:  [facility]  PHYSICIAN:  Barbaraann Share, MD,FCCPDATE OF BIRTH:  1976-03-26  DATE OF STUDY:  01/14/2012                           NOCTURNAL POLYSOMNOGRAM  REFERRING PHYSICIAN:  REGINA BAITY  INDICATION FOR STUDY:  Hypersomnia with sleep apnea.  EPWORTH SLEEPINESS SCORE:  3.  MEDICATIONS:  SLEEP ARCHITECTURE:  The patient had a total sleep time of 256 minutes with no slow wave sleep at 127 minutes of REM.  Sleep onset latency was prolonged at 48 minutes and REM onset was prolonged at 120 minutes. Sleep efficiency was poor at 70%.  RESPIRATORY DATA:  The patient was found to have 2 apneas and 5 obstructive hypopneas, giving her an apnea-hypopnea index of only 2 events per hour.  The events occurred in all body positions, and there was no significant snoring noted.  OXYGEN DATA:  There was O2 desaturation transiently as low as 91% with her rare obstructive events.  CARDIAC DATA:  Rare PVC noted, but no clinically significant arrhythmias were seen.  MOVEMENT-PARASOMNIA:  The patient had no significant leg jerks or other abnormal behaviors noted.  IMPRESSIONS-RECOMMENDATIONS: 1. Small numbers of obstructive events, which do not meet the AHI     criteria for the obstructive sleep apnea syndrome.  The patient     should be encouraged to work aggressively on weight loss. 2. Rare PVC, but no clinically significant arrhythmias were noted.     Barbaraann Share, MD,FCCP Diplomate, American Board of Sleep Medicine    [facility]/MEDQ  D:  01/27/2012 08:19:53  T:  01/27/2012 08:31:08  Job:  [id number]

## 2012-02-22 ENCOUNTER — Telehealth: Payer: Self-pay

## 2012-02-22 NOTE — Telephone Encounter (Signed)
Patient called [name] requesting sleep study results

## 2012-02-23 ENCOUNTER — Telehealth: Payer: Self-pay | Admitting: *Deleted

## 2012-02-23 NOTE — Telephone Encounter (Signed)
Called pt and apnea-hypopnea index (AHI) 1.6 - no desaturation of oxygen: a negative study. No evidence of sleep apnea. Pt understood.

## 2012-02-23 NOTE — Telephone Encounter (Signed)
Apnea-hypopnea index (AHI) 1.6 - no desaturation of oxygen: a negative study. No evidence of sleep apnea.

## 2012-02-23 NOTE — Telephone Encounter (Signed)
Pt called and lvm requesting sleep study results. Please advise.

## 2012-03-22 ENCOUNTER — Telehealth: Payer: Self-pay | Admitting: *Deleted

## 2012-03-22 NOTE — Telephone Encounter (Signed)
Pt requesting refill of Hydrocodone that was prescribed last year for her tendonitis. It is bothering her again.

## 2012-03-22 NOTE — Telephone Encounter (Signed)
Called patient: recurrent bursitis shoulder. She has been taking ibuprofen 400 mg bid.  Plan Ibuprofen 600 mg three times a day. GI precautions  For lack of improvement: OV for steroid injection.  She voiced understanding and will give this a try.

## 2012-05-05 ENCOUNTER — Other Ambulatory Visit: Payer: Self-pay | Admitting: Internal Medicine

## 2012-05-14 ENCOUNTER — Emergency Department (HOSPITAL_COMMUNITY)
Admission: EM | Admit: 2012-05-14 | Discharge: 2012-05-14 | Disposition: A | Discharge status: Home / Self Care | Payer: Medicare Other | Attending: Emergency Medicine | Admitting: Emergency Medicine

## 2012-05-14 ENCOUNTER — Encounter (HOSPITAL_COMMUNITY): Payer: Self-pay | Admitting: Family Medicine

## 2012-05-14 DIAGNOSIS — Z8719 Personal history of other diseases of the digestive system: Secondary | ICD-10-CM | POA: Insufficient documentation

## 2012-05-14 DIAGNOSIS — E785 Hyperlipidemia, unspecified: Secondary | ICD-10-CM | POA: Insufficient documentation

## 2012-05-14 DIAGNOSIS — I471 Supraventricular tachycardia, unspecified: Secondary | ICD-10-CM | POA: Insufficient documentation

## 2012-05-14 DIAGNOSIS — R002 Palpitations: Secondary | ICD-10-CM | POA: Insufficient documentation

## 2012-05-14 DIAGNOSIS — R0789 Other chest pain: Secondary | ICD-10-CM | POA: Insufficient documentation

## 2012-05-14 DIAGNOSIS — Z79899 Other long term (current) drug therapy: Secondary | ICD-10-CM | POA: Insufficient documentation

## 2012-05-14 DIAGNOSIS — Z91040 Latex allergy status: Secondary | ICD-10-CM | POA: Insufficient documentation

## 2012-05-14 DIAGNOSIS — Z8619 Personal history of other infectious and parasitic diseases: Secondary | ICD-10-CM | POA: Insufficient documentation

## 2012-05-14 DIAGNOSIS — Z87891 Personal history of nicotine dependence: Secondary | ICD-10-CM | POA: Insufficient documentation

## 2012-05-14 DIAGNOSIS — Z8679 Personal history of other diseases of the circulatory system: Secondary | ICD-10-CM | POA: Insufficient documentation

## 2012-05-14 MED ORDER — ADENOSINE 6 MG/2ML IV SOLN
6.0000 mg | Freq: Once | INTRAVENOUS | Status: AC
  Administered 2012-05-14: 6 mg via INTRAVENOUS
  Filled 2012-05-14: qty 2

## 2012-05-14 MED ORDER — ADENOSINE 6 MG/2ML IV SOLN
12.0000 mg | Freq: Once | INTRAVENOUS | Status: AC
  Administered 2012-05-14: 12 mg via INTRAVENOUS
  Filled 2012-05-14: qty 4

## 2012-05-14 NOTE — ED Notes (Signed)
Pt tolerated 6mg  Adenosine without issue, HR remains in SVT. Dr. Ignacia Palma remains at bedside, pt alert and interactive

## 2012-05-14 NOTE — ED Notes (Signed)
Pt tolerated Adenosine 12mg  well, HR slowed to 103bpm. Pt alert and oriented x4.

## 2012-05-14 NOTE — ED Notes (Signed)
Per EMS: Pt from home reports that when bending over she had sudden onset of SVT. Hx of the same. HR 160 on EMS arrival. Pt reporting mild chest discomfort. On arrival 130 ST, 143/94. 100% RA. PT AO x 4.

## 2012-05-14 NOTE — ED Notes (Signed)
Dr. Davidson at bedside.
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
## 2012-05-14 NOTE — ED Notes (Signed)
Dr. Ignacia Palma aware of BP 126/51, states ok for d/c. Pt comfortable with d/c and f/u instructions.

## 2012-05-14 NOTE — ED Notes (Signed)
Dr Ignacia Palma, PA student, Merril Abbe, RN and this RN at bedside. Pt alert and oriented, HR SVT

## 2012-06-03 ENCOUNTER — Ambulatory Visit: Payer: Medicare Other | Admitting: Cardiology

## 2012-06-06 ENCOUNTER — Encounter: Payer: Self-pay | Admitting: *Deleted

## 2012-06-06 ENCOUNTER — Ambulatory Visit: Payer: Medicare Other | Admitting: Cardiology

## 2012-06-06 ENCOUNTER — Telehealth: Payer: Self-pay

## 2012-06-06 NOTE — Telephone Encounter (Signed)
Phone call from patient stating she needs a letter from you regarding her being disabled so she can take it to the dmv to get her disabled license plate. Also she states she needs a letter for a new wheelchair.

## 2012-06-09 ENCOUNTER — Telehealth: Payer: Self-pay

## 2012-06-09 NOTE — Telephone Encounter (Signed)
Phone call from patient stating she is needing a letter so she can get a new wheelchair and also she is needing a letter so she can obtain her handicapped license plates.

## 2012-06-10 ENCOUNTER — Ambulatory Visit (INDEPENDENT_AMBULATORY_CARE_PROVIDER_SITE_OTHER): Payer: Medicare Other | Admitting: Cardiology

## 2012-06-10 ENCOUNTER — Encounter: Payer: Self-pay | Admitting: Cardiology

## 2012-06-10 VITALS — BP 130/80 | HR 57 | Ht 68.0 in | Wt 233.1 lb

## 2012-06-10 DIAGNOSIS — E663 Overweight: Secondary | ICD-10-CM

## 2012-06-10 DIAGNOSIS — I471 Supraventricular tachycardia: Secondary | ICD-10-CM | POA: Insufficient documentation

## 2012-06-10 DIAGNOSIS — I10 Essential (primary) hypertension: Secondary | ICD-10-CM

## 2012-06-10 DIAGNOSIS — E785 Hyperlipidemia, unspecified: Secondary | ICD-10-CM

## 2012-06-10 NOTE — Assessment & Plan Note (Signed)
Well-controlled today on beta blocker dose. She presented on HCTZ which she may need to go back on it for edema. But when she takes it it tends to go off her rhythms and increased or palpitations.
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
## 2012-06-10 NOTE — Assessment & Plan Note (Signed)
She had not had an episode of SVT since 2011. At this Mother's Day was lower more dramatic than her last time because her heart rates have been in the 190s and did not break with the first dose of adenosine. She's been a little anxious and nervous since then. She was questioning the possibility of ablation is and I offered to refer to Dr. Ladona Ridgel or Dr. Johney Frame from [facility]. I'll go ahead and refer her to them, but will insure that give enough time for her paper chart to be abstracted into the computer record.  My inflation is that they would probably want to hold back from doing any ablation procedures, she has frequent recurrences. She does want to know what it is all about, how much it would entail as far as risks and benefits.  We'll continue the metoprolol dose as is. We went back over maneuvers to try to break these episodes.

## 2012-06-10 NOTE — Telephone Encounter (Signed)
2 letters done

## 2012-06-10 NOTE — Assessment & Plan Note (Signed)
She is doing really well now with weight loss. She complains of lost about 10 pounds since starting using her recumbent bike.

## 2012-06-10 NOTE — Patient Instructions (Addendum)
Referral with Dr Ladona Ridgel   Your physician recommends that you schedule a follow-up appointment in 12 months  Your physician recommends that you continue on your current medications as directed. Please refer to the Current Medication list given to you today.

## 2012-06-10 NOTE — Assessment & Plan Note (Signed)
On Crestor. Due to have labs checked by her primary physician in a month.

## 2012-06-10 NOTE — Telephone Encounter (Signed)
Pt notified letter are done and are at the front desk. She says she will pick them up on Monday.

## 2012-06-29 ENCOUNTER — Encounter: Payer: Self-pay | Admitting: Cardiology

## 2012-07-18 ENCOUNTER — Encounter: Payer: Self-pay | Admitting: Internal Medicine

## 2012-07-18 ENCOUNTER — Ambulatory Visit (INDEPENDENT_AMBULATORY_CARE_PROVIDER_SITE_OTHER): Payer: Medicare Other | Admitting: Internal Medicine

## 2012-07-18 VITALS — BP 110/80 | HR 68 | Temp 97.0°F | Resp 20 | Ht 68.0 in | Wt 233.0 lb

## 2012-07-18 DIAGNOSIS — Z0000 Encounter for general adult medical examination without abnormal findings: Secondary | ICD-10-CM

## 2012-07-18 DIAGNOSIS — E785 Hyperlipidemia, unspecified: Secondary | ICD-10-CM

## 2012-07-18 DIAGNOSIS — E663 Overweight: Secondary | ICD-10-CM

## 2012-07-18 DIAGNOSIS — E559 Vitamin D deficiency, unspecified: Secondary | ICD-10-CM

## 2012-07-18 DIAGNOSIS — K219 Gastro-esophageal reflux disease without esophagitis: Secondary | ICD-10-CM

## 2012-07-18 DIAGNOSIS — I10 Essential (primary) hypertension: Secondary | ICD-10-CM

## 2012-07-18 DIAGNOSIS — I471 Supraventricular tachycardia: Secondary | ICD-10-CM

## 2012-07-18 DIAGNOSIS — G43909 Migraine, unspecified, not intractable, without status migrainosus: Secondary | ICD-10-CM

## 2012-07-18 NOTE — Assessment & Plan Note (Signed)
BP normal range. Will continue BB for rate and BP control.
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
## 2012-07-18 NOTE — Assessment & Plan Note (Signed)
No having migraines for some time.

## 2012-07-18 NOTE — Assessment & Plan Note (Signed)
Body mass index is 35.44 kg/(m^2). Diet management: smart food choices, PORTION SIZE CONTROL, regular exercise. Goal - to loose 1-2 lbs.month. Target weight - 180 ( 3-5 year project)

## 2012-07-18 NOTE — Patient Instructions (Addendum)
Thanks for coming in.  You seem to be doing well. We will stop the crestor for 3-4 weeks and then get lab and see where we are at. All labs will be deferred to then.  For PSVT - in addition to the valsalva you can try carotid massage or "diver's maneuver."   You are up to date for for tetanus. Talk with Gyn about when to start mammogram and screening for breast cancer. Screening for colon cancer starts at age 36 unless you have a 1st or 2nd degree kin with colon cancer.  Diet management: smart food choices, PORTION SIZE CONTROL, regular exercise. Goal - to loose 1-2 lbs.month. Target weight - less than 200 lbs. A slow and steady process.

## 2012-07-18 NOTE — Assessment & Plan Note (Signed)
Interval history is normal. Limited physical exam, while in w/c, unremarkable sans breast and pelvic deferred to gyn. Immunization is current. Discussed weight. Discussed drug holiday - statins.  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 She will return for lab in 3-4 weeks.

## 2012-07-18 NOTE — Assessment & Plan Note (Addendum)
Still has bouts of rapid heart-rate. She has this infrequently associated with eating and bending over. HR up to 190. She has tried valsalva which will slow the rate. She is instructed in carotid massage  And "divers maneuver."   Dr. Herbie Baltimore, cardiologist, has set her up to see Dr. Ladona Ridgel for evaluation as an ablation candidate.

## 2012-07-18 NOTE — Assessment & Plan Note (Signed)
Controlled.  

## 2012-07-18 NOTE — Assessment & Plan Note (Signed)
Patient reports change in diet and loss of weight and would like a drug holiday from "statins."  Plan  Hold crestor  F/u lab 3-4 weeks with recommendations to follow.

## 2012-08-17 ENCOUNTER — Institutional Professional Consult (permissible substitution): Payer: Medicare Other | Admitting: Internal Medicine

## 2012-08-27 ENCOUNTER — Other Ambulatory Visit: Payer: Self-pay | Admitting: Cardiology

## 2012-08-31 ENCOUNTER — Ambulatory Visit: Payer: Medicare Other | Admitting: Internal Medicine

## 2012-08-31 ENCOUNTER — Encounter: Payer: Self-pay | Admitting: Internal Medicine

## 2012-08-31 ENCOUNTER — Ambulatory Visit (INDEPENDENT_AMBULATORY_CARE_PROVIDER_SITE_OTHER): Payer: Medicare Other | Admitting: Internal Medicine

## 2012-08-31 ENCOUNTER — Ambulatory Visit (INDEPENDENT_AMBULATORY_CARE_PROVIDER_SITE_OTHER): Payer: Medicare Other

## 2012-08-31 VITALS — BP 126/84 | HR 70 | Ht 68.0 in | Wt 230.0 lb

## 2012-08-31 DIAGNOSIS — K219 Gastro-esophageal reflux disease without esophagitis: Secondary | ICD-10-CM

## 2012-08-31 DIAGNOSIS — E785 Hyperlipidemia, unspecified: Secondary | ICD-10-CM

## 2012-08-31 DIAGNOSIS — E559 Vitamin D deficiency, unspecified: Secondary | ICD-10-CM

## 2012-08-31 DIAGNOSIS — I10 Essential (primary) hypertension: Secondary | ICD-10-CM

## 2012-08-31 DIAGNOSIS — I471 Supraventricular tachycardia: Secondary | ICD-10-CM

## 2012-08-31 LAB — HEMOGLOBIN AND HEMATOCRIT, BLOOD
HCT: 37.3 % (ref 36.0–46.0)
Hemoglobin: 12.6 g/dL (ref 12.0–15.0)

## 2012-08-31 NOTE — Assessment & Plan Note (Signed)
I have discussed the risks/benefits/goals/expectations of EP study and catheter ablation of SVT. She will call us to schedule catheter ablation.

## 2012-08-31 NOTE — Assessment & Plan Note (Signed)
Her blood pressure is well controlled. I have asked her to reduce her sodium intake.
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
## 2012-08-31 NOTE — Patient Instructions (Addendum)
  Your physician has recommended that you have an ablation. Catheter ablation is a medical procedure used to treat some cardiac arrhythmias (irregular heartbeats). During catheter ablation, a long, thin, flexible tube is put into a blood vessel in your groin (upper thigh), or neck. This tube is called an ablation catheter. It is then guided to your heart through the blood vessel. Radio frequency waves destroy small areas of heart tissue where abnormal heartbeats may cause an arrhythmia to start. Please see the instruction sheet given to you today.  Dates 9/11,9/15,9/17,9/19,10/01,10/3,10/13,10/15,10/23,10/24,10/27,10/30

## 2012-09-01 LAB — COMPREHENSIVE METABOLIC PANEL
ALT: 16 U/L (ref 0–35)
AST: 20 U/L (ref 0–37)
Albumin: 3.4 g/dL — ABNORMAL LOW (ref 3.5–5.2)
Alkaline Phosphatase: 48 U/L (ref 39–117)
Calcium: 9 mg/dL (ref 8.4–10.5)
Chloride: 104 mEq/L (ref 96–112)
Potassium: 4.3 mEq/L (ref 3.5–5.1)

## 2012-09-01 LAB — LIPID PANEL
HDL: 36.3 mg/dL — ABNORMAL LOW (ref >39.00)
Total CHOL/HDL Ratio: 6
Triglycerides: 60 mg/dL (ref 0.0–149.0)

## 2012-09-01 LAB — HEPATIC FUNCTION PANEL
ALT: 16 U/L (ref 0–35)
Albumin: 3.4 g/dL — ABNORMAL LOW (ref 3.5–5.2)
Total Protein: 7.8 g/dL (ref 6.0–8.3)

## 2012-09-03 LAB — VITAMIN D 1,25 DIHYDROXY
Vitamin D2 1, 25 (OH)2: <8 pg/mL
Vitamin D3 1, 25 (OH)2: 48 pg/mL

## 2012-09-06 ENCOUNTER — Encounter: Payer: Self-pay | Admitting: Internal Medicine

## 2012-09-12 ENCOUNTER — Telehealth: Payer: Self-pay | Admitting: *Deleted

## 2012-09-12 DIAGNOSIS — E785 Hyperlipidemia, unspecified: Secondary | ICD-10-CM

## 2012-09-12 NOTE — Telephone Encounter (Signed)
Pt called states she received the lab report this weekend.  Requests a Rx be set in to the pharmacy for Crestor based on lab report vs. Returning to clinic for OV.  Please advise.

## 2012-09-13 MED ORDER — ROSUVASTATIN CALCIUM 10 MG PO TABS: ORAL_TABLET | ORAL | Status: DC

## 2012-09-13 NOTE — Telephone Encounter (Signed)
Left message on VM for pt to return call.

## 2012-09-13 NOTE — Telephone Encounter (Signed)
Ok, I have done well enough that I can forego an office visit. Rx sent to pharmacy. Lab in 4 weeks - order entered

## 2012-09-21 ENCOUNTER — Telehealth: Payer: Self-pay | Admitting: *Deleted

## 2012-09-21 NOTE — Telephone Encounter (Signed)
Pt called states Sara Hunt from [facility] is requesting medical notes stating pt is wheelchair bound.  Pt is in need of a new wheelchair due to broken wheel on current wheelchair. Sara Hunt contact number is [phone] ext. [phone] for any questions or concerns.  Please advise

## 2012-09-22 NOTE — Telephone Encounter (Signed)
Ok to provide office notes - last 3

## 2012-09-23 NOTE — Telephone Encounter (Signed)
Spoke with Shantell at [facility], faxed last 3 OV notes to  [phone].   She is also requesting an order for a new wheel chair for the patient, this can also be faxed to above number

## 2012-09-27 ENCOUNTER — Telehealth: Payer: Self-pay

## 2012-09-27 DIAGNOSIS — R2689 Other abnormalities of gait and mobility: Secondary | ICD-10-CM

## 2012-09-27 NOTE — Telephone Encounter (Signed)
Phone call from [facility] with [facility] [phone]. She requests you put an order into epic for patients wheelchair. The order needs to have the diagnosis and NPI #. Thanks

## 2012-09-27 NOTE — Telephone Encounter (Signed)
Will patient need to come in? We have faxed office notes. Please advise. Thanks Dr Debby Bud!

## 2012-09-27 NOTE — Telephone Encounter (Signed)
Patient called and left vc msg on triage phone stating that she has been trying to get a manuel wheel chair from [facility] since May. She states they have requested that notes be faxed to them that indicate she is a patient here and she has a medical need for a wheel chair and cannot use a walker or cane. Apparently notes were sent over before but they did not provide the information needed. Please fax the necessary notes to [facility] as soon as you can. Thanks.

## 2012-09-28 DIAGNOSIS — R2689 Other abnormalities of gait and mobility: Secondary | ICD-10-CM | POA: Insufficient documentation

## 2012-09-28 NOTE — Telephone Encounter (Signed)
Order done and on side A counter

## 2012-09-28 NOTE — Telephone Encounter (Signed)
Order has been faxed to [facility] [phone]. Melissa is aware.

## 2012-10-04 ENCOUNTER — Ambulatory Visit (INDEPENDENT_AMBULATORY_CARE_PROVIDER_SITE_OTHER): Payer: Medicare Other | Admitting: Family Medicine

## 2012-10-04 ENCOUNTER — Encounter: Payer: Self-pay | Admitting: Family Medicine

## 2012-10-04 VITALS — BP 132/84 | HR 63

## 2012-10-04 DIAGNOSIS — S46909A Unspecified injury of unspecified muscle, fascia and tendon at shoulder and upper arm level, unspecified arm, initial encounter: Secondary | ICD-10-CM

## 2012-10-04 DIAGNOSIS — S4980XA Other specified injuries of shoulder and upper arm, unspecified arm, initial encounter: Secondary | ICD-10-CM

## 2012-10-04 DIAGNOSIS — S46001A Unspecified injury of muscle(s) and tendon(s) of the rotator cuff of right shoulder, initial encounter: Secondary | ICD-10-CM | POA: Insufficient documentation

## 2012-10-04 MED ORDER — MELOXICAM 15 MG PO TABS: 15.0000 mg | ORAL_TABLET | Freq: Every day | ORAL | Status: DC

## 2012-10-04 NOTE — Assessment & Plan Note (Signed)
Discussed with patient at great length. Patient may have a partial tear of the rotator cuff was in significant amount of pain he wanted to try a steroid injection. Patient did have significant improvement immediately. Patient was given home exercises to start in 48 hours. Meloxicam daily for the next 10 days been as needed. Icing protocol Come back again in 2 weeks for further evaluation. Hopefully she'll not be in as much pain we can do a better scan of the rotator cuff to make sure that there is no true tear.
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
## 2012-10-04 NOTE — Patient Instructions (Signed)
Very nice to meet you You have a rotator cuff injury and subacromial bursitis.  Meloxicam daily x 10 days then as needed.  Ice 20 minutes 2 times a day Rest for next 2 days then start exercises daily and come back and see me in 2 weeks if not better.

## 2012-11-10 ENCOUNTER — Telehealth: Payer: Self-pay | Admitting: Cardiology

## 2012-11-10 NOTE — Telephone Encounter (Signed)
That should be fine.  Sara Lex, MD

## 2012-11-10 NOTE — Telephone Encounter (Signed)
Message forwarded to Dr. Harding.  

## 2012-11-10 NOTE — Telephone Encounter (Signed)
Returned call and pt verified x 2.  Pt informed message received and was sent to MD.  Informed pt Dr. Herbie Baltimore stated it should be fine.  Pt verbalized understanding and agreed w/ plan.

## 2012-11-10 NOTE — Telephone Encounter (Signed)
Having to take some ibuprofen per obgyn dr that she wants to know if ok to take with her current meds.  Please call.

## 2013-01-09 ENCOUNTER — Telehealth: Payer: Self-pay | Admitting: Cardiology

## 2013-01-09 NOTE — Telephone Encounter (Signed)
Please call-question about some medicine she is taking-did not know the name of the medicine-her bottle was at home.

## 2013-01-10 NOTE — Telephone Encounter (Signed)
Returned call.  Left message to call back before 4pm.  

## 2013-01-23 ENCOUNTER — Encounter: Payer: Self-pay | Admitting: Internal Medicine

## 2013-01-23 ENCOUNTER — Ambulatory Visit (INDEPENDENT_AMBULATORY_CARE_PROVIDER_SITE_OTHER): Payer: Medicare Other | Admitting: Internal Medicine

## 2013-01-23 VITALS — BP 130/86 | HR 70 | Temp 97.5°F | Wt 239.0 lb

## 2013-01-23 DIAGNOSIS — N3090 Cystitis, unspecified without hematuria: Secondary | ICD-10-CM

## 2013-01-23 MED ORDER — SULFAMETHOXAZOLE-TMP DS 800-160 MG PO TABS
1.0000 | ORAL_TABLET | Freq: Two times a day (BID) | ORAL | Status: DC
Start: 2013-01-23 — End: 2013-02-01

## 2013-01-23 NOTE — Patient Instructions (Signed)
You do not have any history of elevated blood sugar going back to 2010: see graph.  You do have symptoms that suggest a mild bladder infection. Plan Septra DS twice a day for five days.     Urinary Tract Infection Urinary tract infections (UTIs) can develop anywhere along your urinary tract. Your urinary tract is your body's drainage system for removing wastes and extra water. Your urinary tract includes two kidneys, two ureters, a bladder, and a urethra. Your kidneys are a pair of bean-shaped organs. Each kidney is about the size of your fist. They are located below your ribs, one on each side of your spine. CAUSES Infections are caused by microbes, which are microscopic organisms, including fungi, viruses, and bacteria. These organisms are so small that they can only be seen through a microscope. Bacteria are the microbes that most commonly cause UTIs. SYMPTOMS  Symptoms of UTIs may vary by age and gender of the patient and by the location of the infection. Symptoms in young women typically include a frequent and intense urge to urinate and a painful, burning feeling in the bladder or urethra during urination. Older women and men are more likely to be tired, shaky, and weak and have muscle aches and abdominal pain. A fever may mean the infection is in your kidneys. Other symptoms of a kidney infection include pain in your back or sides below the ribs, nausea, and vomiting. DIAGNOSIS To diagnose a UTI, your caregiver will ask you about your symptoms. Your caregiver also will ask to provide a urine sample. The urine sample will be tested for bacteria and white blood cells. White blood cells are made by your body to help fight infection. TREATMENT  Typically, UTIs can be treated with medication. Because most UTIs are caused by a bacterial infection, they usually can be treated with the use of antibiotics. The choice of antibiotic and length of treatment depend on your symptoms and the type of bacteria  causing your infection. HOME CARE INSTRUCTIONS  If you were prescribed antibiotics, take them exactly as your caregiver instructs you. Finish the medication even if you feel better after you have only taken some of the medication.  Drink enough water and fluids to keep your urine clear or pale yellow.  Avoid caffeine, tea, and carbonated beverages. They tend to irritate your bladder.  Empty your bladder often. Avoid holding urine for long periods of time.  Empty your bladder before and after sexual intercourse.  After a bowel movement, women should cleanse from front to back. Use each tissue only once. SEEK MEDICAL CARE IF:   You have back pain.  You develop a fever.  Your symptoms do not begin to resolve within 3 days. SEEK IMMEDIATE MEDICAL CARE IF:   You have severe back pain or lower abdominal pain.  You develop chills.  You have nausea or vomiting.  You have continued burning or discomfort with urination. MAKE SURE YOU:   Understand these instructions.  Will watch your condition.  Will get help right away if you are not doing well or get worse. Document Released: 10/01/2004 Document Revised: 06/23/2011 Document Reviewed: 01/30/2011 [facility] Patient Information 2014 [place].

## 2013-01-23 NOTE — Progress Notes (Signed)
   Subjective:    Patient ID: MARSHIA TROPEA, female    DOB: [birth year], 38 y.o.   MRN: [id number]  HPI Ms. Yore went to her gyn- she is having some leakage. There is a concern of a problem with diabetes. She is also having some bilateral groin pain and more urgency. Reviewed labs to 2010 - no serum glucose greater than 100.   PMH, FamHx and SocHx reviewed for any changes and relevance. Current Outpatient Prescriptions on File Prior to Visit  Medication Sig Dispense Refill  . metoprolol succinate (TOPROL-XL) 50 MG 24 hr tablet Take 25 mg by mouth at bedtime. Take with or immediately following a meal.      . Multiple Vitamin (MULTIVITAMIN WITH MINERALS) TABS Take 1 tablet by mouth daily.      . rosuvastatin (CRESTOR) 10 MG tablet TAKE ONE TABLET BY MOUTH EVERY DAY  30 tablet  10   No current facility-administered medications on file prior to visit.      Review of Systems System review is negative for any constitutional, cardiac, pulmonary, GI or neuro symptoms or complaints other than as described in the HPI.     Objective:   Physical Exam Filed Vitals:   01/23/13 1113  BP: 130/86  Pulse: 70  Temp: 97.5 F (36.4 C)   gen'l - obese woman in no distress Cor- RRR Pulm - Normal       Assessment & Plan:  1. Urinary frequency - blood glucose levels never greater than 100 going back to 2010. Now with frequency, bilateral groin pain and pain with "holding" it very suggestive of cystitis.  Plan - Septra DS bid x 5

## 2013-01-23 NOTE — Progress Notes (Signed)
Pre visit review using our clinic review tool, if applicable. No additional management support is needed unless otherwise documented below in the visit note. 

## 2013-02-01 ENCOUNTER — Emergency Department (HOSPITAL_COMMUNITY)
Admission: EM | Admit: 2013-02-01 | Discharge: 2013-02-01 | Disposition: A | Discharge status: Home / Self Care | Payer: Medicare Other | Attending: Emergency Medicine | Admitting: Emergency Medicine

## 2013-02-01 ENCOUNTER — Emergency Department (HOSPITAL_COMMUNITY): Payer: Medicare Other

## 2013-02-01 ENCOUNTER — Encounter (HOSPITAL_COMMUNITY): Payer: Self-pay | Admitting: Emergency Medicine

## 2013-02-01 DIAGNOSIS — J45909 Unspecified asthma, uncomplicated: Secondary | ICD-10-CM | POA: Insufficient documentation

## 2013-02-01 DIAGNOSIS — D259 Leiomyoma of uterus, unspecified: Secondary | ICD-10-CM

## 2013-02-01 DIAGNOSIS — Z3202 Encounter for pregnancy test, result negative: Secondary | ICD-10-CM | POA: Insufficient documentation

## 2013-02-01 DIAGNOSIS — G43909 Migraine, unspecified, not intractable, without status migrainosus: Secondary | ICD-10-CM | POA: Insufficient documentation

## 2013-02-01 DIAGNOSIS — R300 Dysuria: Secondary | ICD-10-CM | POA: Insufficient documentation

## 2013-02-01 DIAGNOSIS — Z8619 Personal history of other infectious and parasitic diseases: Secondary | ICD-10-CM | POA: Insufficient documentation

## 2013-02-01 DIAGNOSIS — Z8719 Personal history of other diseases of the digestive system: Secondary | ICD-10-CM | POA: Insufficient documentation

## 2013-02-01 DIAGNOSIS — R350 Frequency of micturition: Secondary | ICD-10-CM | POA: Insufficient documentation

## 2013-02-01 DIAGNOSIS — Z91040 Latex allergy status: Secondary | ICD-10-CM | POA: Insufficient documentation

## 2013-02-01 DIAGNOSIS — E785 Hyperlipidemia, unspecified: Secondary | ICD-10-CM | POA: Insufficient documentation

## 2013-02-01 DIAGNOSIS — N83209 Unspecified ovarian cyst, unspecified side: Secondary | ICD-10-CM | POA: Insufficient documentation

## 2013-02-01 DIAGNOSIS — Z79899 Other long term (current) drug therapy: Secondary | ICD-10-CM | POA: Insufficient documentation

## 2013-02-01 DIAGNOSIS — Z87891 Personal history of nicotine dependence: Secondary | ICD-10-CM | POA: Insufficient documentation

## 2013-02-01 DIAGNOSIS — R3915 Urgency of urination: Secondary | ICD-10-CM | POA: Insufficient documentation

## 2013-02-01 DIAGNOSIS — R109 Unspecified abdominal pain: Secondary | ICD-10-CM

## 2013-02-01 LAB — URINE MICROSCOPIC-ADD ON

## 2013-02-01 LAB — URINALYSIS, ROUTINE W REFLEX MICROSCOPIC
Bilirubin Urine: NEGATIVE
Glucose, UA: NEGATIVE mg/dL
KETONES UR: NEGATIVE mg/dL
NITRITE: NEGATIVE
PROTEIN: NEGATIVE mg/dL
Specific Gravity, Urine: 1.023 (ref 1.005–1.030)
Urobilinogen, UA: 1 mg/dL (ref 0.0–1.0)
pH: 6 (ref 5.0–8.0)

## 2013-02-01 LAB — CBC WITH DIFFERENTIAL/PLATELET
BASOS ABS: 0 10*3/uL (ref 0.0–0.1)
BASOS PCT: 0 % (ref 0–1)
EOS ABS: 0 10*3/uL (ref 0.0–0.7)
EOS PCT: 0 % (ref 0–5)
HEMATOCRIT: 39 % (ref 36.0–46.0)
HEMOGLOBIN: 12.9 g/dL (ref 12.0–15.0)
Lymphocytes Relative: 26 % (ref 12–46)
Lymphs Abs: 2.1 10*3/uL (ref 0.7–4.0)
MCH: 31.6 pg (ref 26.0–34.0)
MCHC: 33.1 g/dL (ref 30.0–36.0)
MCV: 95.6 fL (ref 78.0–100.0)
MONO ABS: 0.4 10*3/uL (ref 0.1–1.0)
MONOS PCT: 5 % (ref 3–12)
Neutro Abs: 5.6 10*3/uL (ref 1.7–7.7)
Neutrophils Relative %: 68 % (ref 43–77)
Platelets: 241 10*3/uL (ref 150–400)
RBC: 4.08 MIL/uL (ref 3.87–5.11)
RDW: 11.9 % (ref 11.5–15.5)
WBC: 8.1 10*3/uL (ref 4.0–10.5)

## 2013-02-01 LAB — POCT PREGNANCY, URINE: Preg Test, Ur: NEGATIVE

## 2013-02-01 LAB — WET PREP, GENITAL
Clue Cells Wet Prep HPF POC: NONE SEEN
TRICH WET PREP: NONE SEEN
Yeast Wet Prep HPF POC: NONE SEEN

## 2013-02-01 LAB — BASIC METABOLIC PANEL
BUN: 8 mg/dL (ref 6–23)
CALCIUM: 9.2 mg/dL (ref 8.4–10.5)
CO2: 25 mEq/L (ref 19–32)
CREATININE: 0.77 mg/dL (ref 0.50–1.10)
Chloride: 103 mEq/L (ref 96–112)
Glucose, Bld: 98 mg/dL (ref 70–99)
Potassium: 3.8 mEq/L (ref 3.7–5.3)
Sodium: 139 mEq/L (ref 137–147)

## 2013-02-01 MED ORDER — MELOXICAM 15 MG PO TABS: 15.0000 mg | ORAL_TABLET | Freq: Every day | ORAL | Status: DC

## 2013-02-01 MED ORDER — IBUPROFEN 800 MG PO TABS
800.0000 mg | ORAL_TABLET | Freq: Once | ORAL | Status: AC
  Administered 2013-02-01: 800 mg via ORAL
  Filled 2013-02-01: qty 1

## 2013-02-01 NOTE — ED Notes (Signed)
US at bedside

## 2013-02-01 NOTE — ED Provider Notes (Signed)
Sara Hunt S 8:00 PM patient discussed in sign out. Patient having some flank pains and discomfort. Her lab testing and CAT scan did not show any kidney stones or other concerning causes of pain. There was some fullness in the right adnexa on CAT scan and this is being evaluated further with pelvic ultrasound. Patient had a very benign pelvic examination without mass or tenderness. No signs of infection.   9:00 PM ultrasound results demonstrate a right ovarian cyst without any other complications and the adnexa area. There also several uterine fibroids primarily to the left side. No other abnormalities on ultrasound. Findings discussed with patient. She will be discharged and instructed to followup with PCP and OB/GYN for further evaluation and treatment.   Martie Lee, PA-C 02/01/13 2120

## 2013-02-01 NOTE — Discharge Instructions (Signed)
Your lab testing, CAT scan of your abdomen and pelvis as well as your ultrasounds have not shown any signs for an emergent condition. There were no signs of kidney stones. No signs of a significant urinary tract infection today. Your ultrasound study did show that you have a right ovarian cyst as well as several uterine fibroids. You may followup with these findings for further evaluation and treatment with your OB/GYN specialist or primary care provider. Return any time for changing or worsening symptoms.     Flank Pain Flank pain is pain in your side. The flank is the area of your side between your upper belly (abdomen) and your back. Pain in this area can be caused by many different things. Ashley care and treatment will depend on the cause of your pain.  Rest as told by your doctor.  Drink enough fluids to keep your pee (urine) clear or pale yellow.  Only take medicine as told by your doctor.  Tell your doctor about any changes in your pain.  Follow up with your doctor. GET HELP RIGHT AWAY IF:   Your pain does not get better with medicine.   You have new symptoms or your symptoms get worse.  Your pain gets worse.   You have belly (abdominal) pain.   You are short of breath.   You always feel sick to your stomach (nauseous).   You keep throwing up (vomiting).   You have puffiness (swelling) in your belly.   You feel lightheaded or you pass out (faint).   You have blood in your pee.  You have a fever or lasting symptoms for more than 2 3 days.  You have a fever and your symptoms suddenly get worse. MAKE SURE YOU:   Understand these instructions.  Will watch your condition.  Will get help right away if you are not doing well or get worse. Document Released: 10/01/2007 Document Revised: 09/16/2011 Document Reviewed: 08/06/2011 [facility] Patient Information 2014 [place].    Fibroids Fibroids are lumps (tumors) that can occur any place  in a woman's body. These lumps are not cancerous. Fibroids vary in size, weight, and where they grow. HOME CARE  Do not take aspirin.  Write down the number of pads or tampons you use during your period. Tell your doctor. This can help determine the best treatment for you. GET HELP RIGHT AWAY IF:  You have pain in your lower belly (abdomen) that is not helped with medicine.  You have cramps that are not helped with medicine.  You have more bleeding between or during your period.  You feel lightheaded or pass out (faint).  Your lower belly pain gets worse. MAKE SURE YOU:  Understand these instructions.  Will watch your condition.  Will get help right away if you are not doing well or get worse. Document Released: 01/24/2010 Document Revised: 03/16/2011 Document Reviewed: 01/24/2010 [facility] Patient Information 2014 [place], [place].    Ovarian Cyst An ovarian cyst is a sac filled with fluid or blood. This sac is attached to the ovary. Some cysts go away on their own. Other cysts need treatment.  HOME CARE   Only take medicine as told by your doctor.  Follow up with your doctor as told.  Get regular pelvic exams and Pap tests. GET HELP IF:  Your periods are late, not regular, or painful.  You stop having periods.  Your belly (abdominal) or pelvic pain does not go away.  Your belly becomes large or  puffy (swollen).  You have a hard time peeing (totally emptying your bladder).  You have pressure on your bladder.  You have pain during sex.  You feel fullness, pressure, or discomfort in your belly.  You lose weight for no reason.  You feel sick most of the time.  You have a hard time pooping (constipation).  You do not feel like eating.  You develop pimples (acne).  You have an increase in hair on your body and face.  You are gaining weight for no reason.  You think you are pregnant. GET HELP RIGHT AWAY IF:   Your belly pain gets worse.  You feel  sick to your stomach (nauseous), and you throw up (vomit).  You have a fever that comes on fast.  You have belly pain while pooping (bowel movement).  Your periods are heavier than usual. MAKE SURE YOU:   Understand these instructions.  Will watch your condition.  Will get help right away if you are not doing well or get worse. Document Released: 06/10/2007 Document Revised: 10/12/2012 Document Reviewed: 08/29/2012 [facility] Patient Information 2014 [place].

## 2013-02-01 NOTE — ED Notes (Signed)
PA at bedside.

## 2013-02-01 NOTE — ED Notes (Signed)
Patient transported to CT 
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
## 2013-02-01 NOTE — ED Notes (Signed)
Pelvic cart at bedside. 

## 2013-02-01 NOTE — ED Notes (Addendum)
Back pain flank pain both side and lower abd pain  Pain moves around saw ucc yesterday, had pap smear 2-3 weeks ago thinks she leaks urine worse ,  She finished meds for uti on Sunday that her dr gave her denies dysuria except when she holds it

## 2013-02-02 LAB — GC/CHLAMYDIA PROBE AMP
CT PROBE, AMP APTIMA: NEGATIVE
GC PROBE AMP APTIMA: NEGATIVE

## 2013-02-02 NOTE — ED Provider Notes (Signed)
Medical screening examination/treatment/procedure(s) were performed by non-physician practitioner and as supervising physician I was immediately available for consultation/collaboration.  EKG Interpretation   None        Orpah Greek, MD 02/02/13 1815

## 2013-02-03 NOTE — ED Provider Notes (Signed)
Medical screening examination/treatment/procedure(s) were performed by non-physician practitioner and as supervising physician I was immediately available for consultation/collaboration.  Leota Jacobsen, MD 02/03/13 [phone]

## 2013-02-08 ENCOUNTER — Ambulatory Visit: Payer: Medicare Other | Admitting: Internal Medicine

## 2013-02-14 ENCOUNTER — Ambulatory Visit: Payer: Medicare Other | Admitting: Internal Medicine

## 2013-02-17 ENCOUNTER — Ambulatory Visit: Payer: Medicare Other | Admitting: Cardiology

## 2013-02-28 ENCOUNTER — Ambulatory Visit: Payer: Medicare Other | Admitting: Internal Medicine

## 2013-03-09 ENCOUNTER — Encounter: Payer: Self-pay | Admitting: Internal Medicine

## 2013-03-09 ENCOUNTER — Ambulatory Visit (INDEPENDENT_AMBULATORY_CARE_PROVIDER_SITE_OTHER): Payer: Medicare Other | Admitting: Internal Medicine

## 2013-03-09 VITALS — BP 126/88 | HR 68 | Ht 68.0 in | Wt 239.0 lb

## 2013-03-09 DIAGNOSIS — I471 Supraventricular tachycardia: Secondary | ICD-10-CM

## 2013-03-09 NOTE — Assessment & Plan Note (Signed)
The patient remains symptomatic. She would like to proceed with ablation after her school term has ended. She will come back in early June and her procedure will tentatively be scheduled for late June. She will continue her beta blocker for now.
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
## 2013-03-09 NOTE — Patient Instructions (Signed)
Your physician recommends that you schedule a follow-up appointment in: mid June with Ileene Hutchinson, PA for Dr. Lovena Le.  We will schedule you to have an SVT ablation after that in late June.

## 2013-03-23 ENCOUNTER — Ambulatory Visit: Payer: Medicare Other | Admitting: Family Medicine

## 2013-06-20 ENCOUNTER — Ambulatory Visit: Payer: Medicare Other | Admitting: Cardiology

## 2013-06-29 ENCOUNTER — Telehealth: Payer: Self-pay

## 2013-06-29 MED ORDER — ROSUVASTATIN CALCIUM 10 MG PO TABS: 10.0000 mg | ORAL_TABLET | Freq: Every day | ORAL | Status: DC

## 2013-06-29 NOTE — Telephone Encounter (Signed)
refill 

## 2013-07-04 ENCOUNTER — Ambulatory Visit: Payer: Medicare Other | Admitting: Internal Medicine

## 2013-07-10 ENCOUNTER — Other Ambulatory Visit: Payer: Self-pay

## 2013-07-11 ENCOUNTER — Encounter: Payer: Self-pay | Admitting: Internal Medicine

## 2013-07-11 ENCOUNTER — Ambulatory Visit (INDEPENDENT_AMBULATORY_CARE_PROVIDER_SITE_OTHER): Payer: Medicare Other | Admitting: Internal Medicine

## 2013-07-11 VITALS — BP 142/88 | HR 73 | Ht 68.0 in

## 2013-07-11 DIAGNOSIS — I471 Supraventricular tachycardia, unspecified: Secondary | ICD-10-CM

## 2013-07-11 NOTE — Assessment & Plan Note (Signed)
Her SVT is now controlled. I have recommended a period of watchful waiting. Will hold off on ablation.

## 2013-07-11 NOTE — Patient Instructions (Signed)
Your physician wants you to follow-up in: 12 months.  You will receive a reminder letter in the mail two months in advance. If you don't receive a letter, please call our office to schedule the follow-up appointment.  Your physician recommends that you continue on your current medications as directed. Please refer to the Current Medication list given to you today.   
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
## 2013-07-12 ENCOUNTER — Ambulatory Visit: Payer: Medicare Other | Admitting: Internal Medicine

## 2013-08-01 ENCOUNTER — Telehealth: Payer: Self-pay | Admitting: Cardiology

## 2013-08-01 NOTE — Telephone Encounter (Signed)
Please call,question about taking a vitamin.

## 2013-08-02 NOTE — Telephone Encounter (Signed)
Left message for pt to call.

## 2013-08-03 NOTE — Telephone Encounter (Signed)
LEFT MESSAGE TO CALL ,IF WE CAN ASSIST HER ANY FURTHER

## 2013-09-09 ENCOUNTER — Emergency Department (HOSPITAL_COMMUNITY)
Admission: EM | Admit: 2013-09-09 | Discharge: 2013-09-09 | Disposition: A | Discharge status: Home / Self Care | Payer: Medicare Other | Attending: Emergency Medicine | Admitting: Emergency Medicine

## 2013-09-09 ENCOUNTER — Encounter (HOSPITAL_COMMUNITY): Payer: Self-pay | Admitting: Emergency Medicine

## 2013-09-09 DIAGNOSIS — R002 Palpitations: Secondary | ICD-10-CM | POA: Diagnosis present

## 2013-09-09 DIAGNOSIS — Z87891 Personal history of nicotine dependence: Secondary | ICD-10-CM | POA: Insufficient documentation

## 2013-09-09 DIAGNOSIS — Z8619 Personal history of other infectious and parasitic diseases: Secondary | ICD-10-CM | POA: Insufficient documentation

## 2013-09-09 DIAGNOSIS — Z8719 Personal history of other diseases of the digestive system: Secondary | ICD-10-CM | POA: Diagnosis not present

## 2013-09-09 DIAGNOSIS — Z79899 Other long term (current) drug therapy: Secondary | ICD-10-CM | POA: Insufficient documentation

## 2013-09-09 DIAGNOSIS — Z91040 Latex allergy status: Secondary | ICD-10-CM | POA: Diagnosis not present

## 2013-09-09 DIAGNOSIS — I471 Supraventricular tachycardia, unspecified: Secondary | ICD-10-CM | POA: Insufficient documentation

## 2013-09-09 DIAGNOSIS — J45909 Unspecified asthma, uncomplicated: Secondary | ICD-10-CM | POA: Diagnosis not present

## 2013-09-09 DIAGNOSIS — E785 Hyperlipidemia, unspecified: Secondary | ICD-10-CM | POA: Insufficient documentation

## 2013-09-09 DIAGNOSIS — I10 Essential (primary) hypertension: Secondary | ICD-10-CM | POA: Diagnosis not present

## 2013-09-09 DIAGNOSIS — R012 Other cardiac sounds: Secondary | ICD-10-CM | POA: Insufficient documentation

## 2013-09-09 DIAGNOSIS — G43909 Migraine, unspecified, not intractable, without status migrainosus: Secondary | ICD-10-CM | POA: Diagnosis not present

## 2013-09-09 LAB — CBC WITH DIFFERENTIAL/PLATELET
Basophils Absolute: 0 10*3/uL (ref 0.0–0.1)
Basophils Relative: 0 % (ref 0–1)
EOS ABS: 0 10*3/uL (ref 0.0–0.7)
Eosinophils Relative: 1 % (ref 0–5)
HCT: 38.3 % (ref 36.0–46.0)
HEMOGLOBIN: 12.9 g/dL (ref 12.0–15.0)
LYMPHS ABS: 1.9 10*3/uL (ref 0.7–4.0)
Lymphocytes Relative: 28 % (ref 12–46)
MCH: 31.5 pg (ref 26.0–34.0)
MCHC: 33.7 g/dL (ref 30.0–36.0)
MCV: 93.4 fL (ref 78.0–100.0)
MONO ABS: 0.4 10*3/uL (ref 0.1–1.0)
MONOS PCT: 6 % (ref 3–12)
Neutro Abs: 4.4 10*3/uL (ref 1.7–7.7)
Neutrophils Relative %: 65 % (ref 43–77)
Platelets: 226 10*3/uL (ref 150–400)
RBC: 4.1 MIL/uL (ref 3.87–5.11)
RDW: 12.1 % (ref 11.5–15.5)
WBC: 6.8 10*3/uL (ref 4.0–10.5)

## 2013-09-09 LAB — APTT: aPTT: 29 seconds (ref 24–37)

## 2013-09-09 LAB — BASIC METABOLIC PANEL
Anion gap: 12 (ref 5–15)
BUN: 11 mg/dL (ref 6–23)
CHLORIDE: 104 meq/L (ref 96–112)
CO2: 23 mEq/L (ref 19–32)
CREATININE: 0.81 mg/dL (ref 0.50–1.10)
Calcium: 9.1 mg/dL (ref 8.4–10.5)
GFR calc Af Amer: >90 mL/min (ref >90)
GFR calc non Af Amer: >90 mL/min (ref >90)
Glucose, Bld: 93 mg/dL (ref 70–99)
Potassium: 4.1 mEq/L (ref 3.7–5.3)
Sodium: 139 mEq/L (ref 137–147)

## 2013-09-09 LAB — I-STAT TROPONIN, ED: Troponin i, poc: 0.02 ng/mL (ref 0.00–0.08)

## 2013-09-09 LAB — PROTIME-INR
INR: 1 (ref 0.00–1.49)
Prothrombin Time: 13.2 seconds (ref 11.6–15.2)

## 2013-09-09 MED ORDER — ADENOSINE 6 MG/2ML IV SOLN
INTRAVENOUS | Status: AC
  Filled 2013-09-09: qty 2

## 2013-09-09 MED ORDER — ADENOSINE 6 MG/2ML IV SOLN
6.0000 mg | Freq: Once | INTRAVENOUS | Status: AC
  Administered 2013-09-09: 6 mg via INTRAVENOUS

## 2013-09-09 MED ORDER — SODIUM CHLORIDE 0.9 % IV SOLN
INTRAVENOUS | Status: DC
  Administered 2013-09-09: 15:00:00 via INTRAVENOUS

## 2013-09-09 MED ORDER — METOPROLOL TARTRATE 1 MG/ML IV SOLN
2.5000 mg | Freq: Once | INTRAVENOUS | Status: AC
  Administered 2013-09-09: 2.5 mg via INTRAVENOUS
  Filled 2013-09-09: qty 5

## 2013-09-09 MED ORDER — ADENOSINE 6 MG/2ML IV SOLN
12.0000 mg | Freq: Once | INTRAVENOUS | Status: AC
  Administered 2013-09-09: 12 mg via INTRAVENOUS
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
## 2013-09-09 NOTE — Discharge Instructions (Signed)
Supraventricular Tachycardia °Supraventricular tachycardia (SVT) is an abnormal heart rhythm (arrhythmia) that causes the heart to beat very fast (tachycardia). This kind of fast heartbeat originates in the upper chambers of the heart (atria). SVT can cause the heart to beat greater than 100 beats per minute. SVT can have a rapid burst of heartbeats. This can start and stop suddenly without warning and is called nonsustained. SVT can also be sustained, in which the heart beats at a continuous fast rate.  °CAUSES  °There can be different causes of SVT. Some of these include: °· Heart valve problems such as mitral valve prolapse. °· An enlarged heart (hypertrophic cardiomyopathy). °· Congenital heart problems. °· Heart inflammation (pericarditis). °· Hyperthyroidism. °· Low potassium or magnesium levels. °· Caffeine. °· Drug use such as cocaine, methamphetamines, or stimulants. °· Some over-the-counter medicines such as: °¨ Decongestants. °¨ Diet medicines. °¨ Herbal medicines. °SYMPTOMS  °Symptoms of SVT can vary. Symptoms depend on whether the SVT is sustained or nonsustained. You may experience: °· No symptoms (asymptomatic). °· An awareness of your heart beating rapidly (palpitations). °· Shortness of breath. °· Chest pain or pressure. °If your blood pressure drops because of the SVT, you may experience: °· Fainting or near fainting. °· Weakness. °· Dizziness. °DIAGNOSIS  °Different tests can be performed to diagnose SVT, such as: °· An electrocardiogram (EKG). This is a painless test that records the electrical activity of your heart. °· Holter monitor. This is a 24 hour recording of your heart rhythm. You will be given a diary. Write down all symptoms that you have and what you were doing at the time you experienced symptoms. °· Arrhythmia monitor. This is a small device that your wear for several weeks. It records the heart rhythm when you have symptoms. °· Echocardiogram. This is an imaging test to help detect  abnormal heart structure such as congenital abnormalities, heart valve problems, or heart enlargement. °· Stress test. This test can help determine if the SVT is related to exercise. °· Electrophysiology study (EPS). This is a procedure that evaluates your heart's electrical system and can help your caregiver find the cause of your SVT. °TREATMENT  °Treatment of SVT depends on the symptoms, how often it recurs, and whether there are any underlying heart problems.  °· If symptoms are rare and no other cardiac disease is present, no treatment may be needed. °· Blood work may be done to check potassium, magnesium, and thyroid hormone levels to see if they are abnormal. If these levels are abnormal, treatment to correct the problems will occur. °Medicines °Your caregiver may use oral medicines to treat SVT. These medicines are given for long-term control of SVT. Medicines may be used alone or in combination with other treatments. These medicines work to slow nerve impulses in the heart muscle. These medicines can also be used to treat high blood pressure. Some of these medicines may include: °· Calcium channel blockers. °· Beta blockers. °· Digoxin. °Nonsurgical procedures °Nonsurgical techniques may be used if oral medicines do not work. Some examples include: °· Cardioversion. This technique uses either drugs or an electrical shock to restore a normal heart rhythm. °¨ Cardioversion drugs may be given through an intravenous (IV) line to help "reset" the heart rhythm. °¨ In electrical cardioversion, the caregiver shocks your heart to stop its beat for a split second. This helps to reset the heart to a normal rhythm. °· Ablation. This procedure is done under mild sedation. High frequency radio wave energy is used to   destroy the area of heart tissue responsible for the SVT. °HOME CARE INSTRUCTIONS  °· Do not smoke. °· Only take medicines prescribed by your caregiver. Check with your caregiver before using over-the-counter  medicines. °· Check with your caregiver about how much alcohol and caffeine (coffee, tea, colas, or chocolate) you may have. °· It is very important to keep all follow-up referrals and appointments in order to properly manage this problem. °SEEK IMMEDIATE MEDICAL CARE IF: °· You have dizziness. °· You faint or nearly faint. °· You have shortness of breath. °· You have chest pain or pressure. °· You have sudden nausea or vomiting. °· You have profuse sweating. °· You are concerned about how long your symptoms last. °· You are concerned about the frequency of your SVT episodes. °If you have the above symptoms, call your local emergency services (911 in [place] immediately. Do not drive yourself to the hospital. °MAKE SURE YOU:  °· Understand these instructions. °· Will watch your condition. °· Will get help right away if you are not doing well or get worse. °Document Released: 12/22/2004 Document Revised: 03/16/2011 Document Reviewed: 04/05/2008 °Exit[employer]® Patient Information ©2015 [employer]. This information is not intended to replace advice given to you by your health care provider. Make sure you discuss any questions you have with your health care provider. ° °
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
## 2013-09-15 ENCOUNTER — Other Ambulatory Visit: Payer: Self-pay | Admitting: *Deleted

## 2013-09-15 MED ORDER — ROSUVASTATIN CALCIUM 10 MG PO TABS: 10.0000 mg | ORAL_TABLET | Freq: Every day | ORAL | Status: DC

## 2013-09-25 ENCOUNTER — Encounter: Payer: Self-pay | Admitting: Physician Assistant

## 2013-09-25 ENCOUNTER — Ambulatory Visit (INDEPENDENT_AMBULATORY_CARE_PROVIDER_SITE_OTHER): Payer: Medicare Other | Admitting: Physician Assistant

## 2013-09-25 VITALS — BP 120/70 | HR 62 | Ht 68.0 in | Wt 243.0 lb

## 2013-09-25 DIAGNOSIS — I471 Supraventricular tachycardia: Secondary | ICD-10-CM

## 2013-09-25 NOTE — Patient Instructions (Signed)
Your physician recommends that you continue on your current medications as directed. Please refer to the Current Medication list given to you today.   Your physician recommends that you schedule a follow-up appointment in: WITH DR Lovena Le AS SOON AS POSSIBLE FOR ABLATION

## 2013-09-25 NOTE — Assessment & Plan Note (Signed)
Recurrent SVT requiring emergency room visit at converting with adenosine 12 mg IV. I discussed this patient with Dr. Lovena Le who recommends seeing him in followup to discuss ablation. Patient does not drink any caffeine and is agreeable to the above. Continue metoprolol.
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
## 2013-09-26 ENCOUNTER — Ambulatory Visit: Payer: Medicare Other | Admitting: Internal Medicine

## 2013-10-03 ENCOUNTER — Encounter: Payer: Self-pay | Admitting: Family Medicine

## 2013-10-03 ENCOUNTER — Ambulatory Visit (INDEPENDENT_AMBULATORY_CARE_PROVIDER_SITE_OTHER): Payer: Medicare Other | Admitting: Family Medicine

## 2013-10-03 ENCOUNTER — Ambulatory Visit (INDEPENDENT_AMBULATORY_CARE_PROVIDER_SITE_OTHER)
Admission: RE | Admit: 2013-10-03 | Discharge: 2013-10-03 | Disposition: A | Discharge status: Home / Self Care | Payer: Medicare Other | Source: Ambulatory Visit | Attending: Family Medicine | Admitting: Family Medicine

## 2013-10-03 VITALS — BP 122/84 | HR 60 | Ht 68.0 in | Wt 243.0 lb

## 2013-10-03 DIAGNOSIS — M25529 Pain in unspecified elbow: Secondary | ICD-10-CM

## 2013-10-03 DIAGNOSIS — M25521 Pain in right elbow: Secondary | ICD-10-CM

## 2013-10-03 NOTE — Patient Instructions (Signed)
Good to see you Ice 20 minutes 2 times a week.  Try pennsaid twice daily.  Exercises starting in 1 week Come back again in 2-3 weeks and we will see how you are doing.

## 2013-10-03 NOTE — Progress Notes (Signed)
CC: Right double pain  HPI: Patient is a very pleasant 38 year old female who is in wheelchair coming in with right shoulder pain. Patient was seen greater than one year ago and did have a subacromial bursitis as well as hypoechoic changes of the bicep tendon itself. Patient did have a corticosteroid injection into the right shoulder. She states overall she is doing relatively well with the shoulder. It states though that she started to have more of a right elbow pain. States it hurts more when she does certain activities. Patient states that pushing a significant amount of time can get some discomfort. Patient is returned #1 episode when she hit it against a wall. Patient denies any swelling. Patient states now she extends all the way or flexes it back and give her a sharp pain. Patient denies any constant numbness from time to time can have an increasing numbness. Patient describes it as a throbbing sensation at times as well. Has been going on for approximately 2 weeks. Has not wake her up at night. Has not tried many home modalities at this time.    Past medical, surgical, family and social history reviewed. Medications reviewed all in the electronic medical record.   Review of Systems: No headache, visual changes, nausea, vomiting, diarrhea, constipation, dizziness, abdominal pain, skin rash, fevers, chills, night sweats, weight loss, swollen lymph nodes, body aches, joint swelling, muscle aches, chest pain, shortness of breath, mood changes.   Objective:    Blood pressure 122/84, pulse 60, height 5\' 8"  (1.727 m), weight 243 lb (110.224 kg), last menstrual period 09/11/2013, SpO2 98.00%.   General: No apparent distress alert and oriented x3 mood and affect normal, dressed appropriately. Patient is obese and in wheelchair. HEENT: Pupils equal, extraocular movements intact Respiratory: Patient's speak in full sentences and does not appear short of breath Cardiovascular: No lower extremity  edema, non tender, no erythema Skin: Warm dry intact with no signs of infection or rash on extremities or on axial skeleton. Abdomen: Soft nontender Neuro: Cranial nerves II through XII are intact, neurovascularly intact in all extremities with 2+ DTRs and 2+ pulses. Lymph: No lymphadenopathy of posterior or anterior cervical chain or axillae bilaterally.  Gait normal with good balance and coordination.  MSK: Non tender with full range of motion and good stability and symmetric strength and tone of shoulders,  wrist, hip, knee and ankles bilaterally.  Elbow: Right Unremarkable to inspection. Range of motion full pronation, supination, flexion, extension patient does have pain mostly over the radial head with any of these range of motion.  Strength is full to all of the above directions Stable to varus, valgus stress. Negative moving valgus stress test.  mild tenderness to palpation over the radial head  Ulnar nerve does not sublux. Negative cubital tunnel Tinel's. Contralateral elbow unremarkable   Musculoskeletal ultrasound was performed and interpreted by Charlann Boxer D.O.   Elbow:  Lateral epicondyle and common extensor tendon origin visualized.  No edema, effusions, or avulsions seen.  Radial head unremarkable and located in annular ligament, mild hypoechoic overlying changes.  Medial epicondyle and common flexor tendon origin visualized.  No edema, effusions, or avulsions seen. Ulnar nerve in cubital tunnel unremarkable. Olecranon and triceps insertion visualized and unremarkable without edema, effusion, or avulsion.  No signs olecranon bursitis. Power doppler signal normal. Patient's joint itself does have a mild effusion as well it states.  IMPRESSION:  Mild nondescript swelling of the right elbow joint. No fracture noted.  Impression and Recommendations:     This case required medical decision making of moderate complexity.

## 2013-10-03 NOTE — Assessment & Plan Note (Signed)
Patient's right elbow pain seems to be consistent with hopefully just a sprain. We are getting an x-ray to rule out any bony abnormalities such as possibly a radial head fracture. Patient does have signs and symptoms that is concerning for a possible ulnar neuropathy. Patient was given suggestions of compression, icing, and home exercises for range of motion. We discussed what activities to avoid. Patient and will come back and see me again in 2-3 weeks for further evaluation and treatment.  Spent greater than 25 minutes with patient face-to-face and had greater than 50% of counseling including as described above in assessment and plan.

## 2013-10-04 ENCOUNTER — Telehealth: Payer: Self-pay | Admitting: Family Medicine

## 2013-10-04 NOTE — Telephone Encounter (Signed)
Pt request result for x-ray that was done 10/03/13. Please call pt

## 2013-10-05 ENCOUNTER — Telehealth: Payer: Self-pay | Admitting: Internal Medicine

## 2013-10-05 NOTE — Telephone Encounter (Signed)
Pt made aware

## 2013-10-05 NOTE — Telephone Encounter (Signed)
Patient would like xray results.

## 2013-10-05 NOTE — Telephone Encounter (Signed)
xrays normal.

## 2013-10-09 ENCOUNTER — Encounter: Payer: Self-pay | Admitting: Internal Medicine

## 2013-10-09 ENCOUNTER — Other Ambulatory Visit (INDEPENDENT_AMBULATORY_CARE_PROVIDER_SITE_OTHER): Payer: Medicare Other

## 2013-10-09 ENCOUNTER — Ambulatory Visit (INDEPENDENT_AMBULATORY_CARE_PROVIDER_SITE_OTHER): Payer: Medicare Other | Admitting: Internal Medicine

## 2013-10-09 VITALS — BP 108/64 | HR 65 | Temp 98.2°F | Resp 18 | Ht 68.0 in

## 2013-10-09 DIAGNOSIS — I471 Supraventricular tachycardia: Secondary | ICD-10-CM

## 2013-10-09 DIAGNOSIS — I10 Essential (primary) hypertension: Secondary | ICD-10-CM

## 2013-10-09 DIAGNOSIS — K219 Gastro-esophageal reflux disease without esophagitis: Secondary | ICD-10-CM

## 2013-10-09 DIAGNOSIS — G43809 Other migraine, not intractable, without status migrainosus: Secondary | ICD-10-CM

## 2013-10-09 DIAGNOSIS — E669 Obesity, unspecified: Secondary | ICD-10-CM

## 2013-10-09 DIAGNOSIS — R0602 Shortness of breath: Secondary | ICD-10-CM

## 2013-10-09 DIAGNOSIS — E785 Hyperlipidemia, unspecified: Secondary | ICD-10-CM

## 2013-10-09 LAB — BASIC METABOLIC PANEL
BUN: 13 mg/dL (ref 6–23)
CO2: 28 mEq/L (ref 19–32)
CREATININE: 0.8 mg/dL (ref 0.4–1.2)
Calcium: 8.8 mg/dL (ref 8.4–10.5)
Chloride: 103 mEq/L (ref 96–112)
GFR: 109.8 mL/min (ref >60.00)
GLUCOSE: 81 mg/dL (ref 70–99)
Potassium: 3.6 mEq/L (ref 3.5–5.1)
SODIUM: 135 meq/L (ref 135–145)

## 2013-10-09 LAB — LIPID PANEL
CHOL/HDL RATIO: 5
CHOLESTEROL: 145 mg/dL (ref 0–200)
HDL: 28.9 mg/dL — ABNORMAL LOW (ref >39.00)
LDL CALC: 100 mg/dL — AB (ref 0–99)
NONHDL: 116.1
Triglycerides: 83 mg/dL (ref 0.0–149.0)
VLDL: 16.6 mg/dL (ref 0.0–40.0)

## 2013-10-09 LAB — HEMOGLOBIN A1C: Hgb A1c MFr Bld: 4.9 % (ref 4.6–6.5)

## 2013-10-09 NOTE — Assessment & Plan Note (Signed)
Will order PFTs, possibly exercise induced asthma. Lungs clear on exam today.
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
## 2013-10-09 NOTE — Assessment & Plan Note (Signed)
Does not have history of hypertension and is on beta blocker for PSVT and will remove this from problem list.

## 2013-10-09 NOTE — Patient Instructions (Signed)
We will arrange to have a lung test to check for asthma. You should hear about this in the last week.   We will also check some blood work on you and let you know about the results.   Work on [employer] again and increasing your level of activity to keep yourself healthy.

## 2013-10-09 NOTE — Assessment & Plan Note (Signed)
Doing well with metoprolol but she wants to pursue ablation as she is afraid to travel given her problems in the past.

## 2013-10-09 NOTE — Progress Notes (Signed)
Pre visit review using our clinic review tool, if applicable. No additional management support is needed unless otherwise documented below in the visit note. 

## 2013-10-09 NOTE — Assessment & Plan Note (Signed)
On crestor, will check lipid panel today.

## 2013-10-09 NOTE — Assessment & Plan Note (Signed)
She is doing well with OTC medications at this time.

## 2013-10-09 NOTE — Assessment & Plan Note (Signed)
Doing well off meds at this time with diet modification.

## 2013-10-10 ENCOUNTER — Encounter: Payer: Self-pay | Admitting: Internal Medicine

## 2013-10-10 ENCOUNTER — Ambulatory Visit (INDEPENDENT_AMBULATORY_CARE_PROVIDER_SITE_OTHER): Payer: Medicare Other | Admitting: Internal Medicine

## 2013-10-10 ENCOUNTER — Encounter: Payer: Self-pay | Admitting: *Deleted

## 2013-10-10 VITALS — BP 128/82 | HR 69 | Ht 68.0 in | Wt 243.0 lb

## 2013-10-10 DIAGNOSIS — E785 Hyperlipidemia, unspecified: Secondary | ICD-10-CM

## 2013-10-10 DIAGNOSIS — I471 Supraventricular tachycardia: Secondary | ICD-10-CM

## 2013-10-10 NOTE — Assessment & Plan Note (Signed)
I have discussed the treatment options with the patient and the risks/benefits goals and expectations of the procedure have been discussed and she wishes to proceed with catheter ablation. This will be scheduled in the next several weeks.

## 2013-10-10 NOTE — Patient Instructions (Signed)
Your physician has recommended that you have an ablation. Catheter ablation is a medical procedure used to treat some cardiac arrhythmias (irregular heartbeats). During catheter ablation, a long, thin, flexible tube is put into a blood vessel in your groin (upper thigh), or neck. This tube is called an ablation catheter. It is then guided to your heart through the blood vessel. Radio frequency waves destroy small areas of heart tissue where abnormal heartbeats may cause an arrhythmia to start. Please see the instruction sheet given to you today.  Your physician recommends that you return for lab work in: 10/23/13. (INR/PT, BMET, CBC) Cardiac Ablation Cardiac ablation is a procedure to disable a small amount of heart tissue in very specific places. The heart has many electrical connections. Sometimes these connections are abnormal and can cause the heart to beat very fast or irregularly. By disabling some of the problem areas, heart rhythm can be improved or made normal. Ablation is done for people who:   Have Wolff-Parkinson-White syndrome.   Have other fast heart rhythms (tachycardia).   Have taken medicines for an abnormal heart rhythm (arrhythmia) that resulted in:   No success.   Side effects.   May have a high-risk heartbeat that could result in death.  LET [facility] CARE PROVIDER KNOW ABOUT:   Any allergies you have or any previous reactions you have had to X-ray dye, food (such as seafood), medicine, or tape.   All medicines you are taking, including vitamins, herbs, eye drops, creams, and over-the-counter medicines.   Previous problems you or members of your family have had with the use of anesthetics.   Any blood disorders you have.   Previous surgeries or procedures (such as a kidney transplant) you have had.   Medical conditions you have (such as kidney failure).  RISKS AND COMPLICATIONS Generally, cardiac ablation is a safe procedure. However, problems can  occur and include:   Increased risk of cancer. Depending on how long it takes to do the ablation, the dose of radiation can be high.  Bruising and bleeding where a thin, flexible tube (catheter) was inserted during the procedure.   Bleeding into the chest, especially into the sac that surrounds the heart (serious).  Need for a permanent pacemaker if the normal electrical system is damaged.   The procedure may not be fully effective, and this may not be recognized for months. Repeat ablation procedures are sometimes required. BEFORE THE PROCEDURE   Follow any instructions from your health care provider regarding eating and drinking before the procedure.   Take your medicines as directed at regular times with water, unless instructed otherwise by your health care provider. If you are taking diabetes medicine, including insulin, ask how you are to take it and if there are any special instructions you should follow. It is common to adjust insulin dosing the day of the ablation.  PROCEDURE  An ablation is usually performed in a catheterization laboratory with the guidance of fluoroscopy. Fluoroscopy is a type of X-ray that helps your health care provider see images of your heart during the procedure.   An ablation is a minimally invasive procedure. This means a small cut (incision) is made in either your neck or groin. Your health care provider will decide where to make the incision based on your medical history and physical exam.  An IV tube will be started before the procedure begins. You will be given an anesthetic or medicine to help you relax (sedative).  The skin on your  neck or groin will be numbed. A needle will be inserted into a large vein in your neck or groin and catheters will be threaded to your heart.  A special dye that shows up on fluoroscopy pictures may be injected through the catheter. The dye helps your health care provider see the area of the heart that needs  treatment.  The catheter has electrodes on the tip. When the area of heart tissue that is causing the arrhythmia is found, the catheter tip will send an electrical current to the area and "scar" the tissue. Three types of energy can be used to ablate the heart tissue:   Heat (radiofrequency energy).   Laser energy.   Extreme cold (cryoablation).   When the area of the heart has been ablated, the catheter will be taken out. Pressure will be held on the insertion site. This will help the insertion site clot and keep it from bleeding. A bandage will be placed on the insertion site.  AFTER THE PROCEDURE   After the procedure, you will be taken to a recovery area where your vital signs (blood pressure, heart rate, and breathing) will be monitored. The insertion site will also be monitored for bleeding.   You will need to lie still for 4-6 hours. This is to ensure you do not bleed from the catheter insertion site.  Document Released: 05/10/2008 Document Revised: 05/08/2013 Document Reviewed: 05/16/2012 [facility] Patient Information 2015 [place], [place]. This information is not intended to replace advice given to you by your health care provider. Make sure you discuss any questions you have with your health care provider.
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
## 2013-10-11 ENCOUNTER — Telehealth: Payer: Self-pay | Admitting: Internal Medicine

## 2013-10-11 NOTE — Telephone Encounter (Signed)
New Message  Pt called to reschedule Cath// sr

## 2013-10-12 ENCOUNTER — Other Ambulatory Visit: Payer: Self-pay | Admitting: *Deleted

## 2013-10-12 NOTE — Addendum Note (Signed)
Addended by: Janan Halter F on: 10/12/2013 10:15 AM   Modules accepted: Orders

## 2013-10-12 NOTE — Telephone Encounter (Signed)
Rescheduled to 11/09/13 and patient aware

## 2013-10-14 ENCOUNTER — Other Ambulatory Visit: Payer: Self-pay | Admitting: Cardiovascular Disease

## 2013-10-16 ENCOUNTER — Other Ambulatory Visit: Payer: Self-pay

## 2013-10-18 ENCOUNTER — Ambulatory Visit: Payer: Medicare Other | Admitting: Family Medicine

## 2013-10-22 ENCOUNTER — Other Ambulatory Visit: Payer: Self-pay | Admitting: Internal Medicine

## 2013-10-23 ENCOUNTER — Other Ambulatory Visit: Payer: Medicare Other

## 2013-10-25 ENCOUNTER — Ambulatory Visit: Payer: Medicare Other | Admitting: Family Medicine

## 2013-10-27 ENCOUNTER — Encounter (HOSPITAL_COMMUNITY): Payer: Self-pay | Admitting: Pharmacy Technician

## 2013-10-27 ENCOUNTER — Ambulatory Visit (HOSPITAL_COMMUNITY): Admit: 2013-10-27 | Payer: Self-pay | Admitting: Internal Medicine

## 2013-10-27 ENCOUNTER — Encounter (HOSPITAL_COMMUNITY): Payer: Self-pay

## 2013-10-27 SURGERY — SUPRAVENTRICULAR TACHYCARDIA ABLATION
Anesthesia: LOCAL

## 2013-10-30 ENCOUNTER — Telehealth: Payer: Self-pay | Admitting: Internal Medicine

## 2013-10-30 NOTE — Telephone Encounter (Signed)
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

## 2013-11-02 ENCOUNTER — Other Ambulatory Visit (INDEPENDENT_AMBULATORY_CARE_PROVIDER_SITE_OTHER): Payer: Medicare Other | Admitting: *Deleted

## 2013-11-02 DIAGNOSIS — E785 Hyperlipidemia, unspecified: Secondary | ICD-10-CM

## 2013-11-02 DIAGNOSIS — I471 Supraventricular tachycardia: Secondary | ICD-10-CM

## 2013-11-06 ENCOUNTER — Other Ambulatory Visit: Payer: Medicare Other | Admitting: *Deleted

## 2013-11-06 LAB — CBC WITH DIFFERENTIAL/PLATELET
BASOS PCT: 0.2 % (ref 0.0–3.0)
Basophils Absolute: 0 10*3/uL (ref 0.0–0.1)
EOS ABS: 0.1 10*3/uL (ref 0.0–0.7)
EOS PCT: 0.8 % (ref 0.0–5.0)
HCT: 37.4 % (ref 36.0–46.0)
HEMOGLOBIN: 12.2 g/dL (ref 12.0–15.0)
LYMPHS PCT: 30.2 % (ref 12.0–46.0)
Lymphs Abs: 2.5 10*3/uL (ref 0.7–4.0)
MCHC: 32.7 g/dL (ref 30.0–36.0)
MCV: 96 fl (ref 78.0–100.0)
Monocytes Absolute: 0.5 10*3/uL (ref 0.1–1.0)
Monocytes Relative: 6.2 % (ref 3.0–12.0)
NEUTROS ABS: 5.2 10*3/uL (ref 1.4–7.7)
Neutrophils Relative %: 62.6 % (ref 43.0–77.0)
Platelets: 238 10*3/uL (ref 150.0–400.0)
RBC: 3.9 Mil/uL (ref 3.87–5.11)
RDW: 12.5 % (ref 11.5–15.5)
WBC: 8.3 10*3/uL (ref 4.0–10.5)

## 2013-11-06 LAB — BASIC METABOLIC PANEL
BUN: 9 mg/dL (ref 6–23)
CALCIUM: 8.9 mg/dL (ref 8.4–10.5)
CO2: 25 meq/L (ref 19–32)
Chloride: 101 mEq/L (ref 96–112)
Creatinine, Ser: 0.8 mg/dL (ref 0.4–1.2)
GFR: 101.98 mL/min (ref >60.00)
Glucose, Bld: 75 mg/dL (ref 70–99)
Potassium: 3.4 mEq/L — ABNORMAL LOW (ref 3.5–5.1)
Sodium: 134 mEq/L — ABNORMAL LOW (ref 135–145)

## 2013-11-07 ENCOUNTER — Encounter: Payer: Self-pay | Admitting: *Deleted

## 2013-11-07 NOTE — Telephone Encounter (Signed)
Should only be out one week post procedure.  Letter faxed

## 2013-11-08 ENCOUNTER — Telehealth: Payer: Self-pay | Admitting: Internal Medicine

## 2013-11-08 NOTE — Telephone Encounter (Signed)
Pt is aware to come to the [facility] main entrance at 7:30 AM the day of the procedure. NPO after midnight. Pt can take her medication with sips of water. I went over all pre- SVT ablation the instructions with pt. Pt  verbalized understanding.

## 2013-11-08 NOTE — Telephone Encounter (Signed)
New problem   Pt is having a procedure tomorrow and need to know what time she need to be at hospital, b/c she lost the instructions/ please call pt.

## 2013-11-09 ENCOUNTER — Ambulatory Visit (HOSPITAL_COMMUNITY)
Admission: RE | Admit: 2013-11-09 | Discharge: 2013-11-09 | Disposition: A | Discharge status: Home / Self Care | Payer: Medicare Other | Source: Ambulatory Visit | Attending: Internal Medicine | Admitting: Internal Medicine

## 2013-11-09 ENCOUNTER — Encounter (HOSPITAL_COMMUNITY)
Admission: RE | Disposition: A | Discharge status: Home / Self Care | Payer: Self-pay | Source: Ambulatory Visit | Attending: Internal Medicine

## 2013-11-09 DIAGNOSIS — I471 Supraventricular tachycardia: Secondary | ICD-10-CM | POA: Diagnosis not present

## 2013-11-09 DIAGNOSIS — Z539 Procedure and treatment not carried out, unspecified reason: Secondary | ICD-10-CM | POA: Insufficient documentation

## 2013-11-09 LAB — HCG, QUANTITATIVE, PREGNANCY: hCG, Beta Chain, Quant, S: 53 m[IU]/mL — ABNORMAL HIGH (ref <5)

## 2013-11-09 LAB — HCG, SERUM, QUALITATIVE: Preg, Serum: POSITIVE — AB

## 2013-11-09 SURGERY — SUPRAVENTRICULAR TACHYCARDIA ABLATION
Anesthesia: LOCAL

## 2013-11-10 ENCOUNTER — Other Ambulatory Visit: Payer: Self-pay | Admitting: Obstetrics and Gynecology

## 2013-11-18 ENCOUNTER — Encounter (HOSPITAL_COMMUNITY): Payer: Self-pay | Admitting: *Deleted

## 2013-11-18 ENCOUNTER — Inpatient Hospital Stay (HOSPITAL_COMMUNITY): Payer: Medicare Other

## 2013-11-18 ENCOUNTER — Emergency Department (INDEPENDENT_AMBULATORY_CARE_PROVIDER_SITE_OTHER)
Admission: EM | Admit: 2013-11-18 | Discharge: 2013-11-18 | Disposition: A | Discharge status: Other Acute Inpatient Hospital | Payer: Medicare Other | Source: Home / Self Care | Attending: Emergency Medicine | Admitting: Emergency Medicine

## 2013-11-18 ENCOUNTER — Inpatient Hospital Stay (HOSPITAL_COMMUNITY)
Admission: AD | Admit: 2013-11-18 | Discharge: 2013-11-18 | Disposition: A | Discharge status: Home / Self Care | Payer: Medicare Other | Source: Ambulatory Visit | Attending: Obstetrics & Gynecology | Admitting: Obstetrics & Gynecology

## 2013-11-18 DIAGNOSIS — Z87891 Personal history of nicotine dependence: Secondary | ICD-10-CM | POA: Insufficient documentation

## 2013-11-18 DIAGNOSIS — O039 Complete or unspecified spontaneous abortion without complication: Secondary | ICD-10-CM | POA: Diagnosis present

## 2013-11-18 DIAGNOSIS — R1031 Right lower quadrant pain: Secondary | ICD-10-CM

## 2013-11-18 HISTORY — DX: Complete or unspecified spontaneous abortion without complication: O03.9

## 2013-11-18 LAB — POCT URINALYSIS DIP (DEVICE)
Bilirubin Urine: NEGATIVE
GLUCOSE, UA: NEGATIVE mg/dL
KETONES UR: NEGATIVE mg/dL
Nitrite: NEGATIVE
Protein, ur: NEGATIVE mg/dL
SPECIFIC GRAVITY, URINE: 1.025 (ref 1.005–1.030)
UROBILINOGEN UA: 0.2 mg/dL (ref 0.0–1.0)
pH: 5.5 (ref 5.0–8.0)

## 2013-11-18 LAB — HCG, QUANTITATIVE, PREGNANCY: HCG, BETA CHAIN, QUANT, S: 20 m[IU]/mL — AB (ref <5)

## 2013-11-18 LAB — POCT PREGNANCY, URINE: Preg Test, Ur: POSITIVE — AB

## 2013-11-18 MED ORDER — KETOROLAC TROMETHAMINE 60 MG/2ML IM SOLN
60.0000 mg | Freq: Once | INTRAMUSCULAR | Status: AC
  Administered 2013-11-18: 60 mg via INTRAMUSCULAR
  Filled 2013-11-18: qty 2

## 2013-11-18 NOTE — ED Notes (Signed)
Pt  Reports  Pain  r  Flank /      r  Side   X    3  Days  Ago     Pt  States  Has  A  History    Of     Ovarian  Cyst           And  Recent poss  Blood   preg  Test
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
## 2013-11-18 NOTE — MAU Note (Signed)
Has had blood work in office.  +preg dx with blood work for cardiac cath procedure.  Hx of dx with ovarian cyst in Jan.  Has had this pain off and on since. The past wk, has been feeling the same pain, more intense.  Has been having slight cramping in lower abd.  Thurs abd- the pain was really sharp.

## 2013-11-18 NOTE — Discharge Instructions (Signed)
Abdominal Pain Many things can cause abdominal pain. Usually, abdominal pain is not caused by a disease and will improve without treatment. It can often be observed and treated at home. Your health care provider will do a physical exam and possibly order blood tests and X-rays to help determine the seriousness of your pain. However, in many cases, more time must pass before a clear cause of the pain can be found. Before that point, your health care provider may not know if you need more testing or further treatment. HOME CARE INSTRUCTIONS  Monitor your abdominal pain for any changes. The following actions may help to alleviate any discomfort you are experiencing:  Only take over-the-counter or prescription medicines as directed by your health care provider.  Do not take laxatives unless directed to do so by your health care provider.  Try a clear liquid diet (broth, tea, or water) as directed by your health care provider. Slowly move to a bland diet as tolerated. SEEK MEDICAL CARE IF:  You have unexplained abdominal pain.  You have abdominal pain associated with nausea or diarrhea.  You have pain when you urinate or have a bowel movement.  You experience abdominal pain that wakes you in the night.  You have abdominal pain that is worsened or improved by eating food.  You have abdominal pain that is worsened with eating fatty foods.  You have a fever. SEEK IMMEDIATE MEDICAL CARE IF:   Your pain does not go away within 2 hours.  You keep throwing up (vomiting).  Your pain is felt only in portions of the abdomen, such as the right side or the left lower portion of the abdomen.  You pass bloody or black tarry stools. MAKE SURE YOU:  Understand these instructions.   Will watch your condition.   Will get help right away if you are not doing well or get worse.  Document Released: 10/01/2004 Document Revised: 12/27/2012 Document Reviewed: 08/31/2012 [facility] Patient Information  2015 [place], [place]. This information is not intended to replace advice given to you by your health care provider. Make sure you discuss any questions you have with your health care provider.  Miscarriage A miscarriage is the sudden loss of an unborn baby (fetus) before the 20th week of pregnancy. Most miscarriages happen in the first 3 months of pregnancy. Sometimes, it happens before a woman even knows she is pregnant. A miscarriage is also called a "spontaneous miscarriage" or "early pregnancy loss." Having a miscarriage can be an emotional experience. Talk with your caregiver about any questions you may have about miscarrying, the grieving process, and your future pregnancy plans. CAUSES   Problems with the fetal chromosomes that make it impossible for the baby to develop normally. Problems with the baby's genes or chromosomes are most often the result of errors that occur, by chance, as the embryo divides and grows. The problems are not inherited from the parents.  Infection of the cervix or uterus.   Hormone problems.   Problems with the cervix, such as having an incompetent cervix. This is when the tissue in the cervix is not strong enough to hold the pregnancy.   Problems with the uterus, such as an abnormally shaped uterus, uterine fibroids, or congenital abnormalities.   Certain medical conditions.   Smoking, drinking alcohol, or taking illegal drugs.   Trauma.  Often, the cause of a miscarriage is unknown.  SYMPTOMS   Vaginal bleeding or spotting, with or without cramps or pain.  Pain or cramping  in the abdomen or lower back.  Passing fluid, tissue, or blood clots from the vagina. DIAGNOSIS  Your caregiver will perform a physical exam. You may also have an ultrasound to confirm the miscarriage. Blood or urine tests may also be ordered. TREATMENT   Sometimes, treatment is not necessary if you naturally pass all the fetal tissue that was in the uterus. If some of the  fetus or placenta remains in the body (incomplete miscarriage), tissue left behind may become infected and must be removed. Usually, a dilation and curettage (D and C) procedure is performed. During a D and C procedure, the cervix is widened (dilated) and any remaining fetal or placental tissue is gently removed from the uterus.  Antibiotic medicines are prescribed if there is an infection. Other medicines may be given to reduce the size of the uterus (contract) if there is a lot of bleeding.  If you have Rh negative blood and your baby was Rh positive, you will need a Rh immunoglobulin shot. This shot will protect any future baby from having Rh blood problems in future pregnancies. HOME CARE INSTRUCTIONS   Your caregiver may order bed rest or may allow you to continue light activity. Resume activity as directed by your caregiver.  Have someone help with home and family responsibilities during this time.   Keep track of the number of sanitary pads you use each day and how soaked (saturated) they are. Write down this information.   Do not use tampons. Do not douche or have sexual intercourse until approved by your caregiver.   Only take over-the-counter or prescription medicines for pain or discomfort as directed by your caregiver.   Do not take aspirin. Aspirin can cause bleeding.   Keep all follow-up appointments with your caregiver.   If you or your partner have problems with grieving, talk to your caregiver or seek counseling to help cope with the pregnancy loss. Allow enough time to grieve before trying to get pregnant again.  SEEK IMMEDIATE MEDICAL CARE IF:   You have severe cramps or pain in your back or abdomen.  You have a fever.  You pass large blood clots (walnut-sized or larger) ortissue from your vagina. Save any tissue for your caregiver to inspect.   Your bleeding increases.   You have a thick, bad-smelling vaginal discharge.  You become lightheaded, weak, or  you faint.   You have chills.  MAKE SURE YOU:  Understand these instructions.  Will watch your condition.  Will get help right away if you are not doing well or get worse. Document Released: 06/17/2000 Document Revised: 04/18/2012 Document Reviewed: 02/10/2011 [facility] Patient Information 2015 [place], [place]. This information is not intended to replace advice given to you by your health care provider. Make sure you discuss any questions you have with your health care provider. Uterine Fibroid A uterine fibroid is a growth (tumor) that occurs in your uterus. This type of tumor is not cancerous and does not spread out of the uterus. You can have one or many fibroids. Fibroids can vary in size, weight, and where they grow in the uterus. Some can become quite large. Most fibroids do not require medical treatment, but some can cause pain or heavy bleeding during and between periods. CAUSES  A fibroid is the result of a single uterine cell that keeps growing (unregulated), which is different than most cells in the human body. Most cells have a control mechanism that keeps them from reproducing without control.  SIGNS AND  SYMPTOMS   Bleeding.  Pelvic pain and pressure.  Bladder problems due to the size of the fibroid.  Infertility and miscarriages depending on the size and location of the fibroid. DIAGNOSIS  Uterine fibroids are diagnosed through a physical exam. Your health care provider may feel the lumpy tumors during a pelvic exam. Ultrasonography may be done to get information regarding size, location, and number of tumors.  TREATMENT   Your health care provider may recommend watchful waiting. This involves getting the fibroid checked by your health care provider to see if it grows or shrinks.   Hormone treatment or an intrauterine device (IUD) may be prescribed.   Surgery may be needed to remove the fibroids (myomectomy) or the uterus (hysterectomy). This depends on your  situation. When fibroids interfere with fertility and a woman wants to become pregnant, a health care provider may recommend having the fibroids removed.  [place] care depends on how you were treated. In general:   Keep all follow-up appointments with your health care provider.   Only take over-the-counter or prescription medicines as directed by your health care provider. If you were prescribed a hormone treatment, take the hormone medicines exactly as directed. Do not take aspirin. It can cause bleeding.   Talk to your health care provider about taking iron pills.  If your periods are troublesome but not so heavy, lie down with your feet raised slightly above your heart. Place cold packs on your lower abdomen.   If your periods are heavy, write down the number of pads or tampons you use per month. Bring this information to your health care provider.   Include green vegetables in your diet.  SEEK IMMEDIATE MEDICAL CARE IF:  You have pelvic pain or cramps not controlled with medicines.   You have a sudden increase in pelvic pain.   You have an increase in bleeding between and during periods.   You have excessive periods and soak tampons or pads in a half hour or less.  You feel lightheaded or have fainting episodes. Document Released: 12/20/1999 Document Revised: 10/12/2012 Document Reviewed: 07/21/2012 [facility] Patient Information 2015 [place], [place]. This information is not intended to replace advice given to you by your health care provider. Make sure you discuss any questions you have with your health care provider.

## 2013-11-18 NOTE — Discharge Instructions (Signed)
Go directly to [facility] from here, do not eat or drink anything on the way.
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
## 2013-11-28 ENCOUNTER — Telehealth: Payer: Self-pay | Admitting: *Deleted

## 2013-11-28 DIAGNOSIS — I471 Supraventricular tachycardia: Secondary | ICD-10-CM

## 2013-11-28 NOTE — Telephone Encounter (Signed)
New Message  Pt called to resch ablation; please call back to discuss

## 2013-12-04 NOTE — Telephone Encounter (Signed)
Follow Up        Pt calling back to reschedule ablation. Please call pt back after 11 to advise.

## 2013-12-12 ENCOUNTER — Encounter: Payer: Self-pay | Admitting: Family

## 2013-12-12 ENCOUNTER — Ambulatory Visit (INDEPENDENT_AMBULATORY_CARE_PROVIDER_SITE_OTHER): Payer: Medicare Other | Admitting: Family

## 2013-12-12 DIAGNOSIS — R109 Unspecified abdominal pain: Secondary | ICD-10-CM

## 2013-12-12 MED ORDER — TRAMADOL HCL 50 MG PO TABS: 50.0000 mg | ORAL_TABLET | Freq: Three times a day (TID) | ORAL | Status: DC | PRN

## 2013-12-12 NOTE — Patient Instructions (Signed)
Thank you for choosing [facility].  Summary/Instructions:  Your prescription(s) have been submitted to your pharmacy. Please take as directed and contact our office if you believe you are having problem(s) with the medication(s).  Referrals have been made during this visit. You should expect to hear back from our schedulers in about 7-10 days in regards to establishing an appointment with the specialists we discussed.   If your symptoms worsen or fail to improve, please contact our office for further instruction, or in case of emergency go directly to the emergency room at the closest medical facility.   Flank Pain Flank pain refers to pain that is located on the side of the body between the upper abdomen and the back. The pain may occur over a short period of time (acute) or may be long-term or reoccurring (chronic). It may be mild or severe. Flank pain can be caused by many things. CAUSES  Some of the more common causes of flank pain include:  Muscle strains.   Muscle spasms.   A disease of your spine (vertebral disk disease).   A lung infection (pneumonia).   Fluid around your lungs (pulmonary edema).   A kidney infection.   Kidney stones.   A very painful skin rash caused by the chickenpox virus (shingles).   Gallbladder disease.  [place] care will depend on the cause of your pain. In general,  Rest as directed by your caregiver.  Drink enough fluids to keep your urine clear or pale yellow.  Only take over-the-counter or prescription medicines as directed by your caregiver. Some medicines may help relieve the pain.  Tell your caregiver about any changes in your pain.  Follow up with your caregiver as directed. SEEK IMMEDIATE MEDICAL CARE IF:   Your pain is not controlled with medicine.   You have new or worsening symptoms.  Your pain increases.   You have abdominal pain.   You have shortness of breath.   You have  persistent nausea or vomiting.   You have swelling in your abdomen.   You feel faint or pass out.   You have blood in your urine.  You have a fever or persistent symptoms for more than 2-3 days.  You have a fever and your symptoms suddenly get worse. MAKE SURE YOU:   Understand these instructions.  Will watch your condition.  Will get help right away if you are not doing well or get worse. Document Released: 02/12/2005 Document Revised: 09/16/2011 Document Reviewed: 08/06/2011 [facility] Patient Information 2015 [place], [place]. This information is not intended to replace advice given to you by your health care provider. Make sure you discuss any questions you have with your health care provider.

## 2013-12-12 NOTE — Progress Notes (Signed)
   Subjective:    Patient ID: Sara Hunt, female    DOB: [birth year], 38 y.o.   MRN: [id number]  Chief Complaint  Patient presents with  . Flank Pain    thats been going on off and on all year, has has appendix checked, rulled out ovarian cysts, doesn't know that else to do    HPI:  Sara Hunt is a 38 y.o. female who presents today for flank pain.  Chronic flank pain has been going on and off for a year. Indicates she has had her appendix checked and ovarian cysts have been ruled out as well. Has had previous ovarian cysts. Describes the pain being in the same area as her ovarian cyst. Denies any changes in bowel habits. Indicates the pain worsens as her bladder fills. Indicates the pain is slightly decreased following voiding. Points to the location of pain being her right flank in the area of the costovertebral angle and wrapping around her abdomen.  Allergies  Allergen Reactions  . Latex Rash   Current Outpatient Prescriptions on File Prior to Visit  Medication Sig Dispense Refill  . ibuprofen (ADVIL,MOTRIN) 200 MG tablet Take 400-600 mg by mouth every 6 (six) hours as needed for headache or moderate pain.     . metoprolol succinate (TOPROL-XL) 50 MG 24 hr tablet Take 50 mg by mouth every evening. Take with or immediately following a meal.    . metroNIDAZOLE (FLAGYL) 500 MG tablet Take 500 mg by mouth 2 (two) times daily. 7 day dose started 11-16-13 for bacterial infection    . norethindrone (MICRONOR,CAMILA,ERRIN) 0.35 MG tablet Take 1 tablet by mouth daily.     . rosuvastatin (CRESTOR) 10 MG tablet Take 10 mg by mouth daily.     No current facility-administered medications on file prior to visit.     Review of Systems    See HPI  Objective:    BP 138/88 mmHg  Pulse 81  Temp(Src) 98.2 F (36.8 C) (Oral)  Resp 18  Ht   Wt   SpO2 97%  LMP 10/28/2013 (Exact Date) Nursing note and vital signs reviewed.  Physical Exam  Constitutional: She is oriented to person,  place, and time. She appears well-developed and well-nourished. No distress.  Female seated in a wheelchair. Appears her stated age. Dress appropriately for the situation.  Cardiovascular: Normal rate, regular rhythm, normal heart sounds and intact distal pulses.   Pulmonary/Chest: Effort normal and breath sounds normal.  Abdominal: Soft. Bowel sounds are normal. She exhibits no distension and no mass. There is tenderness (Tenderness elicited right lower quadrant. Negative McBurney's or rigidity.). There is no rebound and no guarding.  Neurological: She is alert and oriented to person, place, and time.  Skin: Skin is warm and dry.  Psychiatric: She has a normal mood and affect. Her behavior is normal. Judgment and thought content normal.       Assessment & Plan:

## 2013-12-12 NOTE — Progress Notes (Signed)
Pre visit review using our clinic review tool, if applicable. No additional management support is needed unless otherwise documented below in the visit note. 

## 2013-12-12 NOTE — Assessment & Plan Note (Signed)
Idiopathic right flank pain continues. Previous imaging revealed no appendicitis or ovarian cyst. Most recently completed ultrasound which showed no ovarian cyst. Patient insists his feeling is similar to when she had her ovarian cyst. The reorder CT of the abdomen and pelvis with and without contrast. Pending results consider referral to gastroenterology. Start tramadol as needed for pain. Follow up if symptoms worsen or fail to improve.

## 2013-12-15 ENCOUNTER — Encounter (HOSPITAL_COMMUNITY): Payer: Self-pay | Admitting: *Deleted

## 2013-12-15 ENCOUNTER — Other Ambulatory Visit (HOSPITAL_COMMUNITY): Payer: Self-pay | Admitting: *Deleted

## 2013-12-15 NOTE — Telephone Encounter (Signed)
Will schedule for 01/12/14  Labs 12/30

## 2013-12-19 ENCOUNTER — Encounter: Payer: Self-pay | Admitting: *Deleted

## 2013-12-26 ENCOUNTER — Ambulatory Visit (INDEPENDENT_AMBULATORY_CARE_PROVIDER_SITE_OTHER)
Admission: RE | Admit: 2013-12-26 | Discharge: 2013-12-26 | Disposition: A | Discharge status: Home / Self Care | Payer: Medicare Other | Source: Ambulatory Visit | Attending: Family | Admitting: Family

## 2013-12-26 ENCOUNTER — Encounter: Payer: Self-pay | Admitting: Family

## 2013-12-26 DIAGNOSIS — R109 Unspecified abdominal pain: Secondary | ICD-10-CM

## 2013-12-27 ENCOUNTER — Telehealth: Payer: Self-pay | Admitting: Internal Medicine

## 2013-12-27 NOTE — Telephone Encounter (Signed)
Pt calling with questions about her cat scan, she did receive results on my chart and has q's pertaining the report mentioning her spine.

## 2013-12-28 NOTE — Telephone Encounter (Signed)
Left message for patient and will attempt to call back.

## 2014-01-02 ENCOUNTER — Telehealth: Payer: Self-pay | Admitting: Family

## 2014-01-02 NOTE — Telephone Encounter (Signed)
Discussed CT results with patients and answered patients questions.

## 2014-01-03 ENCOUNTER — Other Ambulatory Visit: Payer: Medicare Other

## 2014-01-04 ENCOUNTER — Other Ambulatory Visit: Payer: Medicare Other

## 2014-01-07 ENCOUNTER — Other Ambulatory Visit: Payer: Self-pay

## 2014-01-07 ENCOUNTER — Encounter (HOSPITAL_COMMUNITY): Payer: Self-pay | Admitting: *Deleted

## 2014-01-07 ENCOUNTER — Emergency Department (HOSPITAL_COMMUNITY)
Admission: EM | Admit: 2014-01-07 | Discharge: 2014-01-08 | Disposition: A | Discharge status: Home / Self Care | Payer: Medicare Other | Attending: Emergency Medicine | Admitting: Emergency Medicine

## 2014-01-07 DIAGNOSIS — Z8619 Personal history of other infectious and parasitic diseases: Secondary | ICD-10-CM | POA: Diagnosis not present

## 2014-01-07 DIAGNOSIS — Z91040 Latex allergy status: Secondary | ICD-10-CM | POA: Insufficient documentation

## 2014-01-07 DIAGNOSIS — I10 Essential (primary) hypertension: Secondary | ICD-10-CM | POA: Insufficient documentation

## 2014-01-07 DIAGNOSIS — I471 Supraventricular tachycardia: Secondary | ICD-10-CM | POA: Diagnosis not present

## 2014-01-07 DIAGNOSIS — Z79899 Other long term (current) drug therapy: Secondary | ICD-10-CM | POA: Diagnosis not present

## 2014-01-07 DIAGNOSIS — Z3202 Encounter for pregnancy test, result negative: Secondary | ICD-10-CM | POA: Diagnosis not present

## 2014-01-07 DIAGNOSIS — E785 Hyperlipidemia, unspecified: Secondary | ICD-10-CM | POA: Insufficient documentation

## 2014-01-07 DIAGNOSIS — J45901 Unspecified asthma with (acute) exacerbation: Secondary | ICD-10-CM | POA: Insufficient documentation

## 2014-01-07 DIAGNOSIS — R000 Tachycardia, unspecified: Secondary | ICD-10-CM | POA: Diagnosis not present

## 2014-01-07 DIAGNOSIS — Z87891 Personal history of nicotine dependence: Secondary | ICD-10-CM | POA: Diagnosis not present

## 2014-01-07 DIAGNOSIS — Z8719 Personal history of other diseases of the digestive system: Secondary | ICD-10-CM | POA: Insufficient documentation

## 2014-01-07 DIAGNOSIS — R002 Palpitations: Secondary | ICD-10-CM | POA: Diagnosis present

## 2014-01-07 LAB — I-STAT CHEM 8, ED
BUN: 11 mg/dL (ref 6–23)
CHLORIDE: 106 meq/L (ref 96–112)
Calcium, Ion: 1.19 mmol/L (ref 1.12–1.23)
Creatinine, Ser: 0.8 mg/dL (ref 0.50–1.10)
Glucose, Bld: 111 mg/dL — ABNORMAL HIGH (ref 70–99)
HEMATOCRIT: 44 % (ref 36.0–46.0)
Hemoglobin: 15 g/dL (ref 12.0–15.0)
Potassium: 3.5 mmol/L (ref 3.5–5.1)
Sodium: 142 mmol/L (ref 135–145)
TCO2: 19 mmol/L (ref 0–100)

## 2014-01-07 LAB — CBC WITH DIFFERENTIAL/PLATELET
BASOS ABS: 0 10*3/uL (ref 0.0–0.1)
BASOS PCT: 0 % (ref 0–1)
Eosinophils Absolute: 0.1 10*3/uL (ref 0.0–0.7)
Eosinophils Relative: 1 % (ref 0–5)
HCT: 40.1 % (ref 36.0–46.0)
Hemoglobin: 13.4 g/dL (ref 12.0–15.0)
Lymphocytes Relative: 32 % (ref 12–46)
Lymphs Abs: 2.8 10*3/uL (ref 0.7–4.0)
MCH: 31.4 pg (ref 26.0–34.0)
MCHC: 33.4 g/dL (ref 30.0–36.0)
MCV: 93.9 fL (ref 78.0–100.0)
Monocytes Absolute: 0.6 10*3/uL (ref 0.1–1.0)
Monocytes Relative: 7 % (ref 3–12)
NEUTROS ABS: 5.3 10*3/uL (ref 1.7–7.7)
NEUTROS PCT: 60 % (ref 43–77)
PLATELETS: 242 10*3/uL (ref 150–400)
RBC: 4.27 MIL/uL (ref 3.87–5.11)
RDW: 12.3 % (ref 11.5–15.5)
WBC: 8.9 10*3/uL (ref 4.0–10.5)

## 2014-01-07 LAB — I-STAT TROPONIN, ED: TROPONIN I, POC: 0.21 ng/mL — AB (ref 0.00–0.08)

## 2014-01-07 LAB — POC URINE PREG, ED: PREG TEST UR: NEGATIVE

## 2014-01-07 MED ORDER — ADENOSINE 6 MG/2ML IV SOLN
12.0000 mg | Freq: Once | INTRAVENOUS | Status: AC
  Administered 2014-01-07: 12 mg via INTRAVENOUS

## 2014-01-07 MED ORDER — ADENOSINE 6 MG/2ML IV SOLN
INTRAVENOUS | Status: AC
  Administered 2014-01-07: 12 mg via INTRAVENOUS
  Filled 2014-01-07: qty 6
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
## 2014-01-07 NOTE — ED Notes (Signed)
Per EMS: pt coming from home with c/o SVT. Pt was at home started feeling palpitations, tried vagal maneuvers without success, EMS arrived to find pt in SVT with HR 190, pt was given 6 mg of Adenosine, HR decreased to 140's in ST. Pt denies chest pain, lightheadedness, dizziness. Pt has hx of SVT, scheduled for ablation on Friday. Pt is wheelchair bound

## 2014-01-07 NOTE — ED Notes (Signed)
MD at bedside. 

## 2014-01-08 ENCOUNTER — Encounter (HOSPITAL_COMMUNITY): Payer: Self-pay | Admitting: Radiology

## 2014-01-08 ENCOUNTER — Emergency Department (HOSPITAL_COMMUNITY): Payer: Medicare Other

## 2014-01-08 DIAGNOSIS — I471 Supraventricular tachycardia: Secondary | ICD-10-CM | POA: Diagnosis not present

## 2014-01-08 DIAGNOSIS — R000 Tachycardia, unspecified: Secondary | ICD-10-CM | POA: Diagnosis not present

## 2014-01-08 LAB — I-STAT TROPONIN, ED
TROPONIN I, POC: 0.33 ng/mL — AB (ref 0.00–0.08)
Troponin i, poc: 0.43 ng/mL (ref 0.00–0.08)

## 2014-01-08 MED ORDER — IOHEXOL 350 MG/ML SOLN
75.0000 mL | Freq: Once | INTRAVENOUS | Status: AC | PRN
  Administered 2014-01-08: 75 mL via INTRAVENOUS

## 2014-01-08 MED ORDER — SODIUM CHLORIDE 0.9 % IV BOLUS (SEPSIS)
500.0000 mL | Freq: Once | INTRAVENOUS | Status: AC
  Administered 2014-01-08: 500 mL via INTRAVENOUS

## 2014-01-08 MED ORDER — SODIUM CHLORIDE 0.9 % IV BOLUS (SEPSIS)
1000.0000 mL | Freq: Once | INTRAVENOUS | Status: AC
  Administered 2014-01-08: 1000 mL via INTRAVENOUS

## 2014-01-08 MED ORDER — METOPROLOL SUCCINATE ER 25 MG PO TB24: 25.0000 mg | ORAL_TABLET | Freq: Every day | ORAL | Status: DC

## 2014-01-08 MED ORDER — METOPROLOL SUCCINATE ER 25 MG PO TB24
25.0000 mg | ORAL_TABLET | Freq: Every day | ORAL | Status: DC
  Administered 2014-01-08: 25 mg via ORAL
  Filled 2014-01-08: qty 1

## 2014-01-08 NOTE — ED Notes (Signed)
Patient transported to CT 

## 2014-01-08 NOTE — ED Notes (Signed)
Dr Randal Buba given a copy of troponin results .[place]

## 2014-01-08 NOTE — ED Notes (Signed)
EDP at bedside  

## 2014-01-08 NOTE — ED Notes (Signed)
Dr Randal Buba given a copy of troponin .[place]

## 2014-01-08 NOTE — Discharge Instructions (Signed)
Arrhythmogenic Right Ventricular Dysplasia Arrhythmogenic right ventricular dysplasia is a condition in which the right ventricle of the heart is replaced by fatty and scar tissue. Arrhythmogenic right ventricular dysplasia results in an enlarged heart, and individuals with the condition are susceptible to abnormal heart rhythms that can lead to sudden death. Physicians believe the condition is inherited in about 25% to 50% of cases. SYMPTOMS   Weakness.  Poor exercise tolerance.  Shortness of breath.  Swelling of the feet (edema).  Rapid heart rate with exercise (tachycardia).  Increased work of breathing (dyspnea).  Feeling your heart beat rapidly in your chest (palpitations). CAUSES  The cause of arrhythmogenic right ventricular dysplasia is unknown. Physicians and scientists in [place] have studied the condition most intensely because it is a prevalent cause of sudden death in athletes there. The process produces a right ventricle that is dilated and susceptible to abnormal heart rhythms called arrhythmias. These arrhythmias are believed to reduce the heart's ability to pump blood to the body effectively, resulting in the symptoms described earlier. RISK INCREASES WITH: A family history of arrhythmogenic right ventricular dysplasia. PREVENTION  Medications such as beta-blockers to decrease the work of the heart.  Elimination of activities that put a great amount of strain on the heart and cardiovascular system.  Genetic counseling regarding family history and, if arrhythmogenic right ventricular dysplasia is present, decrease of activity level. PROGNOSIS  The natural history of arrhythmogenic right ventricular dysplasia is unknown. Lifestyle changes are generally expected in athletes, who usually cannot train or compete at high levels because of symptoms, in order to improve symptoms. Medications have also been shown to control symptoms in selected cases. RISKS AND  COMPLICATIONS  Sudden death.  Poor athletic performance.  Irregular heart rate. TREATMENT  Diagnostic tests to confirm the condition include:  Cardiogram, echocardiogram, and cardiac catheterization.  Electrophysiological studies, allowing caregivers to carefully study the heart's response to increases in heart rate, are often performed.  In some cases a biopsy of the heart is performed.  Athletes are generally encouraged to stop strenuous training and competition due to the severe stress the body is placed under.  Medications may be recommended. These medications decrease the work of the heart. Although the medication can be poorly tolerated, a medical regimen is usually the first step in treatment.  Because the condition is inherited, screening of family members and close relatives is usually offered.  Individuals who are close to the patient such as family or co-workers should be trained in cardiopulmonary resuscitation (CPR) in case a cardiac arrest occurs.  Caregivers should monitor athletes closely and tests should be repeated at intervals to determine how the condition is progressing.  Use of a medical alert bracelet is recommended.  Surgery to remove abnormal tissue from the heart may be considered. MEDICATION The primary medications prescribed are beta-blockers and calcium channel blockers. These drugs can reduce the work of the heart and reduce abnormal rhythms. ACTIVITY  Athletes are generally counseled to reduce activity to levels in respect to the severity of the disease as indicated by the diagnostic. Although some athletes have elected to continue playing despite counseling, there is very little data to determine who is capable of continuing without risk of sudden death.  Activity following safe guidelines is recommended; athletes should not regard themselves as incapable of all activity. DIET A salt (low-sodium) diet is recommended if symptoms of heart failure  develop. Otherwise, no special diet is required. SEEK MEDICAL CARE IF:   You have  an episode of fainting (syncope), especially if related to exercise.  A close relative is found to have hypertrophic cardiomyopathy.  There is a change in your exercise tolerance.  You develop palpitations, shortness of breath, chest pain, or increased difficulty breathing.  Medications prescribed for this condition produce intolerable side effects. Document Released: 07/23/2004 Document Revised: 03/16/2011 Document Reviewed: 04/05/2008 [facility] Patient Information 2015 [place], [place]. This information is not intended to replace advice given to you by your health care provider. Make sure you discuss any questions you have with your health care provider.  Cardiac Ablation  Cardiac ablation is a procedure to stop some heart tissue from causing problems. The heart has many electrical connections. Sometimes these connections cause the heart to beat very fast or irregularly. Removing some of the problem areas can improve heart rhythm or make it normal. Ablation is done for people who:   Have Wolff-Parkinson-White syndrome.  Have other fast heart rhythms (tachycardia).  Have taken medicines for an abnormal heart rhythm (arrhythmia) and the medicines had:  No success.  Side effects.  May have a type of heartbeat that could cause death. BEFORE THE PROCEDURE   Follow instructions from your doctor about eating and drinking before the procedure.  Take your medicines as told by your doctor. Take them at regular times with water unless told differently by your doctor.  If you are taking diabetes medicine, ask your doctor how to take it. Ask if there are any special instructions you should follow. Your doctor may change how much insulin you take the day of the procedure. PROCEDURE   A special type of X-ray will be used. The X-ray helps your doctor see images of your heart during the procedure.  A small cut  (incision) will be made in your neck or groin.  An IV tube will be started before the procedure begins.  You will be given a numbing medicine (anesthetic) or a medicine to help you relax (sedative).  The skin on your neck or groin will be numbed.  A needle will be put into a large vein in your neck or groin.  A thin, flexible tube (catheter) will be put in to reach your heart.  A dye will be put in the tube. The dye will show up on X-rays. It will help your doctor see the area of the heart that needs treatment.  When the heart tissue that is causing problems is found, the tip of the tube will send an electrical current to it. This will stop it from causing problems.  The tube will be taken out.  Pressure will be put on the area where the tube was. This will keep it from bleeding. A bandage will be placed over the area. AFTER THE PROCEDURE  You will be taken to a recovery area. Your blood pressure, heart rate, and breathing will be watched. The area where the tube was will also be watched for bleeding.  You will need to lie still for 4-6 hours. This keeps the area where the tube was from bleeding. Document Released: 08/24/2012 Document Revised: 05/08/2013 Document Reviewed: 08/24/2012 [facility] Patient Information 2015 [place], [place]. This information is not intended to replace advice given to you by your health care provider. Make sure you discuss any questions you have with your health care provider.

## 2014-01-08 NOTE — ED Notes (Signed)
Dr Randal Buba given a copy of troponin results .21

## 2014-01-12 ENCOUNTER — Encounter (HOSPITAL_COMMUNITY)
Admission: RE | Disposition: A | Discharge status: Home / Self Care | Payer: Self-pay | Source: Ambulatory Visit | Attending: Internal Medicine

## 2014-01-12 ENCOUNTER — Ambulatory Visit (HOSPITAL_COMMUNITY)
Admission: RE | Admit: 2014-01-12 | Discharge: 2014-01-13 | Disposition: A | Discharge status: Home / Self Care | Payer: Medicare Other | Source: Ambulatory Visit | Attending: Internal Medicine | Admitting: Internal Medicine

## 2014-01-12 ENCOUNTER — Encounter (HOSPITAL_COMMUNITY): Payer: Self-pay | Admitting: General Practice

## 2014-01-12 DIAGNOSIS — J45909 Unspecified asthma, uncomplicated: Secondary | ICD-10-CM | POA: Diagnosis not present

## 2014-01-12 DIAGNOSIS — E785 Hyperlipidemia, unspecified: Secondary | ICD-10-CM | POA: Diagnosis not present

## 2014-01-12 DIAGNOSIS — I10 Essential (primary) hypertension: Secondary | ICD-10-CM | POA: Diagnosis present

## 2014-01-12 DIAGNOSIS — Z6836 Body mass index (BMI) 36.0-36.9, adult: Secondary | ICD-10-CM | POA: Diagnosis not present

## 2014-01-12 DIAGNOSIS — I471 Supraventricular tachycardia: Secondary | ICD-10-CM | POA: Diagnosis not present

## 2014-01-12 DIAGNOSIS — Z91040 Latex allergy status: Secondary | ICD-10-CM | POA: Diagnosis not present

## 2014-01-12 DIAGNOSIS — K219 Gastro-esophageal reflux disease without esophagitis: Secondary | ICD-10-CM | POA: Diagnosis not present

## 2014-01-12 DIAGNOSIS — Z87891 Personal history of nicotine dependence: Secondary | ICD-10-CM | POA: Diagnosis not present

## 2014-01-12 HISTORY — PX: SUPRAVENTRICULAR TACHYCARDIA ABLATION: SHX5492

## 2014-01-12 HISTORY — PX: ABLATION OF DYSRHYTHMIC FOCUS: SHX254

## 2014-01-12 LAB — POCT ACTIVATED CLOTTING TIME: Activated Clotting Time: 79 seconds

## 2014-01-12 LAB — PREGNANCY, URINE: Preg Test, Ur: NEGATIVE

## 2014-01-12 SURGERY — SUPRAVENTRICULAR TACHYCARDIA ABLATION
Anesthesia: LOCAL

## 2014-01-12 MED ORDER — ONDANSETRON HCL 4 MG/2ML IJ SOLN: 4.0000 mg | Freq: Four times a day (QID) | INTRAMUSCULAR | Status: DC | PRN

## 2014-01-12 MED ORDER — BUPIVACAINE HCL (PF) 0.25 % IJ SOLN
INTRAMUSCULAR | Status: AC
Start: 2014-01-12 — End: 2014-01-12
  Filled 2014-01-12: qty 60

## 2014-01-12 MED ORDER — MIDAZOLAM HCL 5 MG/5ML IJ SOLN
INTRAMUSCULAR | Status: AC
Start: 2014-01-12 — End: 2014-01-12
  Filled 2014-01-12: qty 5

## 2014-01-12 MED ORDER — FENTANYL CITRATE 0.05 MG/ML IJ SOLN
INTRAMUSCULAR | Status: AC
  Filled 2014-01-12: qty 2

## 2014-01-12 MED ORDER — SODIUM CHLORIDE 0.9 % IJ SOLN
3.0000 mL | Freq: Two times a day (BID) | INTRAMUSCULAR | Status: DC
  Administered 2014-01-12: 3 mL via INTRAVENOUS

## 2014-01-12 MED ORDER — SODIUM CHLORIDE 0.9 % IV SOLN: 250.0000 mL | INTRAVENOUS | Status: DC | PRN

## 2014-01-12 MED ORDER — ISOPROTERENOL HCL 0.2 MG/ML IJ SOLN
2.0000 ug/min | INTRAMUSCULAR | Status: AC
  Administered 2014-01-12: 2 ug/min via INTRAVENOUS
  Filled 2014-01-12: qty 2

## 2014-01-12 MED ORDER — ACETAMINOPHEN 325 MG PO TABS: 650.0000 mg | ORAL_TABLET | ORAL | Status: DC | PRN

## 2014-01-12 MED ORDER — MIDAZOLAM HCL 5 MG/5ML IJ SOLN
INTRAMUSCULAR | Status: AC
  Filled 2014-01-12: qty 5

## 2014-01-12 MED ORDER — OFF THE BEAT BOOK
Freq: Once | Status: AC
  Administered 2014-01-12: 15:00:00
  Filled 2014-01-12: qty 1

## 2014-01-12 MED ORDER — SODIUM CHLORIDE 0.9 % IJ SOLN: 3.0000 mL | INTRAMUSCULAR | Status: DC | PRN

## 2014-01-12 MED ORDER — HEPARIN (PORCINE) IN NACL 2-0.9 UNIT/ML-% IJ SOLN
INTRAMUSCULAR | Status: AC
  Filled 2014-01-12: qty 500

## 2014-01-12 MED ORDER — OFF THE BEAT BOOK
Freq: Once | Status: AC
  Filled 2014-01-12: qty 1

## 2014-01-12 MED ORDER — FENTANYL CITRATE 0.05 MG/ML IJ SOLN
25.0000 ug | Freq: Once | INTRAMUSCULAR | Status: AC
  Administered 2014-01-12: 25 ug via INTRAVENOUS

## 2014-01-12 MED ORDER — IBUPROFEN 400 MG PO TABS
400.0000 mg | ORAL_TABLET | Freq: Four times a day (QID) | ORAL | Status: DC | PRN
  Filled 2014-01-12: qty 2

## 2014-01-12 NOTE — Progress Notes (Addendum)
7Fr venous rt IJ removed and pressure held x 10 min/ rt groin 6 fr x2 and 83fr venous sheaths removed. Pressure  20 min by Bayard Males RN.t groin dressing clean dry and intact. Post procedure instructions given. Patient c/o chronic back 10/10. dr Lovena Le called
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
## 2014-01-12 NOTE — CV Procedure (Signed)
Electrophysiology procedure note  Procedure: Invasive electrophysiologic study and catheter ablation of AV node reentrant tachycardia using Isuprel  Indication: Symptomatic SVT, refractory to medical therapy  Preprocedure diagnoses: Symptomatic SVT refractory to medical therapy  Postprocedure diagnosis: AV node reentrant tachycardia status post catheter ablation  Description of the procedure: After informed consent was obtained, the patient was taken to the diagnostic electrophysiology laboratory in the fasting state. After the usual preparation and draping, intravenous Versed and fentanyl was used for sedation. A 6 French hexapolar catheter was inserted percutaneously into the right jugular vein and advanced to the coronary sinus. A 6 French quadripolar catheter was inserted percutaneously into the right femoral vein and advanced to the right ventricle. A 6 French quadripolar catheter was inserted percutaneously into the right femoral vein and advanced to the his bundle region. After measurement of the basic intervals, rapid ventricular pacing was carried out from the right ventricle, and stepwise decreased down to 340 ms where VA block was demonstrated. During rapid ventricular pacing, the atrial activation was midline and decremental. Next, programmed ventricular stimulation was carried out from the right ventricle at a pacing cycle length of 500 ms. The S1-S2 interval was stepwise decreased from 440 ms down to 340 ms, where ventricular refractoriness was observed. During programmed ventricular stimulation, atrial activation was midline and decremental. Next programmed atrial stimulation was carried out from the coronary sinus the patient cycle length of 600 ms. The S1-S2 interval was stepwise decreased from 440 ms down to 260 ms were atrial refractoriness was observed. During programmed atrial stimulation, there was an AH jump and multiple echo beats. There was no inducible SVT. Next, rapid atrial  pacing was carried out from the coronary cycle length of 500 ms. The patient interval was then decreased down to 340 ms, where AV WB was noted. During atrial pacing, the PR interval was greater than the RR interval but no SVT was induced. Next, Isuprel was infused at 1-4 mics/min and additional programmed atrial stimulation was carried out from the CS at a cycle length of 500. The S1S2 interval was decreased down to 361ms and SVT was induced at a cycle length of 400 ms. This was a narrow QRS tachycardia. The VA interval was short, less than 50 ms. PVCs were placed at the time of his bundle refractoriness, and did not pre-excite the atrium. Ventricular pacing during tachycardia resulted in a VAV conduction sequence. A diagnosis of AV node reentrant tachycardia was made. The cycle length was 390 ms. Isuprel was stopped during the ablation and then restarted after the ablation.  Mapping of the patient's region between the tricuspid valve annulus, the AV node, and the coronary sinus was carried out. This demonstrated a average sized coronary sinus, and the space between the coronary sinus and the tricuspid valve was large. Mapping demonstrated a moderate sized area where there was a small local atrial signal and a large local ventricular signal on the ablation catheter. 2 RF energy applications were delivered. Accelerated junctional rhythm was seen. There was no VA block Following the second radiofrequency energy application, pacing was carried out from the coronary sinus. The PR interval was less than the RR interval, and there was no inducible SVT. In addition, there is no other inducible arrhythmias.There was a residual AH jump but no echo beats. The patient was observed for approximately 15 minutes. Additional pacing was carried out demonstrating no inducible SVT. AV block was now demonstrated at 300 ms on isuprel. At this point the catheters were  removed and the patient was returned to the recovery area for  removal of her sheaths.  Complications: There were no immediate procedure complications  Results. 1. Baseline ECG. The baseline ECG demonstrated normal sinus rhythm with no ventricular preexcitation. 2. Baseline intervals. The sinus node cycle length was 651 ms. The QRS duration was 100 ms. The HV interval was 52 ms, and the AH interval was 108 ms. 3. Rapid ventricular pacing. Rapid ventricular pacing was carried out from the right ventricle demonstrating VA block at a cycle length of 360 ms. During rapid ventricular pacing, the atrial activation was midline and decremental. 4. Programmed ventricular stimulation. Programmed ventricular stimulation was carried out from the right ventricle and the patient cycle length of 500 ms. The S1-S2 interval was stepwise decreased from 440 ms down to 340 ms, where ventricular refractoriness was observed. During programmed ventricular stimulation, the atrial activation was midline and decremental. 5. Rapid atrial pacing. Rapid atrial pacing was carried out from the coronary sinus and the pacing cycle length of 600 ms, and stepwise decreased down to 340 ms, resulting AV block. Following catheter ablation, AV block was demonstrated at a pacing cycle length of 300 ms, on isuprel and there was no inducible SVT. 6. Programmed atrial stimulation. Programmed atrial stimulation was carried out from the coronary sinus at a pacing cycle length of 600 ms. The S1-S2 interval was stepwise decreased from 440 ms, down to 260 ms. During programmed atrial stimulation, there is no inducible SVT initially. There were multiple AH jumps and echo beats. On isuprel, programmed atrial stimulation at 500/310 resulted in the induction of SVT. 7. Arrhythmias observed. AV node reentrant tachycardia, induced with programmed atrial stimulation at 500/310 from the coronary sinus, on isuprel, terminated with rapid atrial pacing at 250 ms, cycle length of the tachycardia was 400 ms. 8. Mapping.  Mapping of the tachycardia demonstrated a very short VA interval, and midline atrial activation. 9. Radiofrequency energy application. 2 radiofrequency energy applications were delivered to the region fairly close to the AV node. During radiofrequency energy application, accelerated junctional rhythm was seen with no VA block.  Conclusion. Successful catheter ablation of AV node reentrant tachycardia with 2 RF energy applications delivered to the region of the slow pathway which was fairly close to the patient's AV node. Following ablation, there was no inducible SVT and minimal evidence of residual slow pathway conduction despite infusion with isuprel.  Mikle Bosworth.D

## 2014-01-12 NOTE — Discharge Summary (Signed)
  Physician Discharge Summary  Patient ID: EVANGELINE UTLEY MRN: [id number] DOB/AGE: 12-Jan-1975 39 y.o.  Primary Cardiologist: Dr. Ellyn Hack Electrophysiologist: Dr. Lovena Le  Admit date: 01/12/2014 Discharge date: 01/13/2014  Admission Diagnoses: Paroxysmal Supraventricular Tachycardia  Discharge Diagnoses:  Principal Problem:   PSVT (paroxysmal supraventricular tachycardia)  **S/P successful RFCA this admission.  Active Problems:   Hypertension   Hyperlipidemia   Discharged Condition: stable  Hospital Course: The patient is a 39 y/o female with a history of PSVT. Despite compliance with BB therapy, she has had breakthrough SVT. She has been highly symptomatic with these episodes. Recently, she has had multiple episodes requiring vagal maneuvers and/or adenosine. She was referred to Dr. Lovena Le who recommended catheter ablation.  She presented to [facility]/12/13 to undergo the planned procedure. The procedure was performed by Dr. Lovena Le. EP study induced AV node reentrant tachycardia.  Mapping of the tachycardia demonstrated a very short VA interval, and midline atrial activation. 2 radiofrequency energy applications were delivered to the region fairly close to the AV node. During radiofrequency energy application, accelerated junctional rhythm was seen with no VA block. Following ablation, there was no inducible SVT and minimal evidence of residual slow pathway conduction despite infusion with isuprel. She tolerated the procedure well. She was monitored post-procedure and had no complications. She was last seen and examined by Dr. Rayann Heman who determined she was stable for discharge home today.  Consults: None  Significant Diagnostic Studies: EP Study with successful radiofrequency catheter ablation for SVT.  Discharge Exam: Blood pressure 133/67, pulse 71, temperature 98 F (36.7 C), temperature source Oral, resp. rate 16, height 5\' 8"  (1.727 m), weight 243 lb 13.3 oz (110.6 kg), last menstrual  period 12/24/2013, SpO2 98 %, unknown if currently breastfeeding.   Disposition: 01-Home or Self Care     Medication List    STOP taking these medications        metoprolol succinate 25 MG 24 hr tablet  Commonly known as:  TOPROL-XL      TAKE these medications        ibuprofen 200 MG tablet  Commonly known as:  ADVIL,MOTRIN  Take 400-600 mg by mouth every 6 (six) hours as needed for headache or moderate pain.     multivitamin with minerals Tabs tablet  Take 1 tablet by mouth daily.     norethindrone 0.35 MG tablet  Commonly known as:  MICRONOR,CAMILA,ERRIN  Take 1 tablet by mouth daily.     rosuvastatin 10 MG tablet  Commonly known as:  CRESTOR  Take 10 mg by mouth daily.       Follow-up Information    Follow up with Cristopher Peru, MD In 4 weeks.   Specialty:  Cardiology   Why:  we will arrange and contact you.   Contact information:   [address] [address] [phone]     TIME SPENT ON DISCHARGE, INCLUDING PHYSICIAN TIME:>30 MINUTES  Signed: Murray Hodgkins, NP 01/13/2014, 9:41 AM  Thompson Grayer MD

## 2014-01-13 ENCOUNTER — Encounter (HOSPITAL_COMMUNITY): Payer: Self-pay | Admitting: Nurse Practitioner

## 2014-01-13 DIAGNOSIS — Z91040 Latex allergy status: Secondary | ICD-10-CM | POA: Diagnosis not present

## 2014-01-13 DIAGNOSIS — Z87891 Personal history of nicotine dependence: Secondary | ICD-10-CM | POA: Diagnosis not present

## 2014-01-13 DIAGNOSIS — I10 Essential (primary) hypertension: Secondary | ICD-10-CM | POA: Diagnosis present

## 2014-01-13 DIAGNOSIS — E785 Hyperlipidemia, unspecified: Secondary | ICD-10-CM | POA: Diagnosis not present

## 2014-01-13 DIAGNOSIS — J45909 Unspecified asthma, uncomplicated: Secondary | ICD-10-CM | POA: Diagnosis not present

## 2014-01-13 DIAGNOSIS — K219 Gastro-esophageal reflux disease without esophagitis: Secondary | ICD-10-CM | POA: Diagnosis not present

## 2014-01-13 DIAGNOSIS — I471 Supraventricular tachycardia: Secondary | ICD-10-CM | POA: Diagnosis not present

## 2014-01-13 DIAGNOSIS — Z6836 Body mass index (BMI) 36.0-36.9, adult: Secondary | ICD-10-CM | POA: Diagnosis not present

## 2014-01-13 NOTE — Discharge Instructions (Signed)

## 2014-01-13 NOTE — Progress Notes (Signed)
   Patient Name: Sara Hunt Date of Encounter: 01/13/2014   Principal Problem:   PSVT (paroxysmal supraventricular tachycardia) Active Problems:   Hypertension   Hyperlipidemia   Morbid Obesity  SUBJECTIVE  No chest pain, sob, or palpitations overnight.  No SVT on tele. Eager to go home.  CURRENT MEDS . sodium chloride  3 mL Intravenous Q12H    OBJECTIVE  Filed Vitals:   01/12/14 2001 01/13/14 0023 01/13/14 0614 01/13/14 0754  BP: 138/74 129/75 137/84 133/67  Pulse: 82 71 71 71  Temp: 98 F (36.7 C) 98.1 F (36.7 C) 98 F (36.7 C) 98 F (36.7 C)  TempSrc: Oral Oral Oral Oral  Resp: 18 20 20 16   Height:      Weight:  243 lb 13.3 oz (110.6 kg)    SpO2: 100% 97% 98% 98%    Intake/Output Summary (Last 24 hours) at 01/13/14 0908 Last data filed at 01/12/14 2003  Gross per 24 hour  Intake    600 ml  Output    600 ml  Net      0 ml   Filed Weights   01/12/14 0556 01/13/14 0023  Weight: 244 lb (110.678 kg) 243 lb 13.3 oz (110.6 kg)    PHYSICAL EXAM  General: Pleasant, NAD. Neuro: Alert and oriented X 3. Moves all extremities spontaneously. Psych: Normal affect. HEENT:  Normal  Neck: Supple without bruits.  Obese - difficult to assess JVP. Lungs:  Resp regular and unlabored, CTA. Heart: RRR no s3, s4, or murmurs. Abdomen: Soft, non-tender, non-distended, BS + x 4.  Extremities: No clubbing, cyanosis or edema. DP/PT/Radials 2+ and equal bilaterally. R groin and R neck access sites w/o bleeding/bruit/hematoma.  Accessory Clinical Findings  None  TELE  RSR  ECG  RSR, 67, nonspecific st/t changes.  ASSESSMENT AND PLAN  1.  PSVT:  S/p successful RFCA yesterday.  No recurrent SVT overnight.  Now off of BB. F/U Dr. Lovena Le in 4 wks.  2.  HTN:  Pt denies h/o of this.  BP's in 130's off of prev home dose of bb.  She will follow BP's @ home and report if elevated.  3.  HL:  On statin.  4.  Morbid Obesity:  Would benefit from outpt dietary  counseling.  Signed, Murray Hodgkins NP   I have seen, examined the patient, and reviewed the above assessment and plan.  Changes to above are made where necessary.    Co Sign: Thompson Grayer, MD 01/13/2014 10:14 AM

## 2014-01-13 NOTE — Progress Notes (Signed)
Pt d/cd home with family via her own wheelchair. Discharge instructions discussed with pt and questions answered. All belongings taken home with pt.  Pt in nad, no chest pain or sob. Rhythm is sinus.

## 2014-01-18 ENCOUNTER — Encounter (HOSPITAL_COMMUNITY): Payer: Self-pay | Admitting: Internal Medicine

## 2014-01-20 ENCOUNTER — Other Ambulatory Visit: Payer: Self-pay | Admitting: Internal Medicine

## 2014-02-13 ENCOUNTER — Encounter: Payer: Medicare Other | Admitting: Internal Medicine

## 2014-02-16 DIAGNOSIS — Z01419 Encounter for gynecological examination (general) (routine) without abnormal findings: Secondary | ICD-10-CM | POA: Diagnosis not present

## 2014-02-16 DIAGNOSIS — Z114 Encounter for screening for human immunodeficiency virus [HIV]: Secondary | ICD-10-CM | POA: Diagnosis not present

## 2014-02-16 DIAGNOSIS — Z113 Encounter for screening for infections with a predominantly sexual mode of transmission: Secondary | ICD-10-CM | POA: Diagnosis not present

## 2014-02-16 DIAGNOSIS — Z1159 Encounter for screening for other viral diseases: Secondary | ICD-10-CM | POA: Diagnosis not present

## 2014-02-22 ENCOUNTER — Other Ambulatory Visit: Payer: Self-pay | Admitting: Geriatric Medicine

## 2014-02-22 ENCOUNTER — Telehealth: Payer: Self-pay | Admitting: Internal Medicine

## 2014-02-22 MED ORDER — ROSUVASTATIN CALCIUM 10 MG PO TABS: 10.0000 mg | ORAL_TABLET | Freq: Every day | ORAL | Status: DC

## 2014-02-22 NOTE — Telephone Encounter (Signed)
Pt request refill for rosuvastatin (CRESTOR) 10 MG tablet  To be send to [facility] on [address]. Please advise

## 2014-02-22 NOTE — Telephone Encounter (Signed)
Sent to pharmacy 

## 2014-03-01 ENCOUNTER — Encounter: Payer: Self-pay | Admitting: Physician Assistant

## 2014-03-01 ENCOUNTER — Ambulatory Visit (INDEPENDENT_AMBULATORY_CARE_PROVIDER_SITE_OTHER): Payer: Medicare Other | Admitting: Physician Assistant

## 2014-03-01 VITALS — BP 118/70 | HR 67 | Ht 68.0 in | Wt 243.0 lb

## 2014-03-01 DIAGNOSIS — I471 Supraventricular tachycardia: Secondary | ICD-10-CM | POA: Diagnosis not present

## 2014-03-01 DIAGNOSIS — R002 Palpitations: Secondary | ICD-10-CM

## 2014-03-01 DIAGNOSIS — E785 Hyperlipidemia, unspecified: Secondary | ICD-10-CM

## 2014-03-01 DIAGNOSIS — I10 Essential (primary) hypertension: Secondary | ICD-10-CM

## 2014-03-01 LAB — BASIC METABOLIC PANEL
BUN: 13 mg/dL (ref 6–23)
CO2: 27 mEq/L (ref 19–32)
Calcium: 9.4 mg/dL (ref 8.4–10.5)
Chloride: 105 mEq/L (ref 96–112)
Creatinine, Ser: 0.79 mg/dL (ref 0.40–1.20)
GFR: 104.78 mL/min (ref >60.00)
Glucose, Bld: 100 mg/dL — ABNORMAL HIGH (ref 70–99)
Potassium: 3.9 mEq/L (ref 3.5–5.1)
Sodium: 135 mEq/L (ref 135–145)

## 2014-03-01 LAB — TSH: TSH: 1.52 u[IU]/mL (ref 0.35–4.50)

## 2014-03-01 LAB — MAGNESIUM: Magnesium: 1.8 mg/dL (ref 1.5–2.5)
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
## 2014-03-01 NOTE — Patient Instructions (Addendum)
Your physician has recommended you make the following change in your medication:   LABS TODAY BMET TSH MG2   STOP TAKING CRESTOR  AFTER A FEW DAYS OF STOPPING CRESTOR  START TAKING TOPROLOL EVERY OTHER DAY FOR 3 DAYS THEN  STOP    FOLLOW  UP WITH DR Lovena Le IN  3 MONTHS    PLEASE FOLLOW UP WITH PRIMARY CARE DOCTER ON CHOLESTEROL MEDICINES   CALL HEART DR OFFICE IF PALPITATIONS CONTINUE

## 2014-03-02 ENCOUNTER — Telehealth: Payer: Self-pay | Admitting: *Deleted

## 2014-03-02 NOTE — Telephone Encounter (Signed)
pt notified about lab results with verbal understanding  

## 2014-03-15 ENCOUNTER — Other Ambulatory Visit (INDEPENDENT_AMBULATORY_CARE_PROVIDER_SITE_OTHER): Payer: Medicare Other

## 2014-03-15 ENCOUNTER — Encounter: Payer: Self-pay | Admitting: Internal Medicine

## 2014-03-15 ENCOUNTER — Ambulatory Visit (INDEPENDENT_AMBULATORY_CARE_PROVIDER_SITE_OTHER): Payer: Medicare Other | Admitting: Internal Medicine

## 2014-03-15 VITALS — BP 122/82 | HR 78 | Temp 97.7°F | Resp 14 | Wt 245.0 lb

## 2014-03-15 DIAGNOSIS — I471 Supraventricular tachycardia: Secondary | ICD-10-CM | POA: Diagnosis not present

## 2014-03-15 DIAGNOSIS — E785 Hyperlipidemia, unspecified: Secondary | ICD-10-CM | POA: Diagnosis not present

## 2014-03-15 LAB — LIPID PANEL
Cholesterol: 219 mg/dL — ABNORMAL HIGH (ref 0–200)
HDL: 32.6 mg/dL — AB (ref >39.00)
LDL Cholesterol: 171 mg/dL — ABNORMAL HIGH (ref 0–99)
NonHDL: 186.4
Total CHOL/HDL Ratio: 7
Triglycerides: 75 mg/dL (ref 0.0–149.0)
VLDL: 15 mg/dL (ref 0.0–40.0)

## 2014-03-15 NOTE — Progress Notes (Signed)
Pre visit review using our clinic review tool, if applicable. No additional management support is needed unless otherwise documented below in the visit note. 

## 2014-03-15 NOTE — Patient Instructions (Signed)
We will check the cholesterol today and let you know about the results.   As far as counseling I recommend these two groups which both have websites that tell about their providers so you can make a choice that makes sense for you.   [facility] [email or web address]  [facility] [email or web address]

## 2014-03-15 NOTE — Progress Notes (Signed)
   Subjective:    Patient ID: Sara Hunt, female    DOB: 05-15-1975, 39 y.o.   MRN: [id number]  HPI The patient is a 39 YO female who is coming in to follow up on her PSVT (has been dealing with for years) now that she has had ablation and is off her metoprolol. She was seen by cardiology and taken off crestor in February due to palpitations being a rare side effect of the crestor. She is doing well off the crestor but wants to make sure her cholesterol is okay. Denies palpitations. Denies SOB. She has been off for some time (crestor). She is also having some anxiety and would like some suggestions for a counselor to talk to about the stress in her life and at work especially.   Review of Systems  Constitutional: Negative for fever, activity change, appetite change and fatigue.  HENT: Negative for congestion.   Respiratory: Negative for cough, chest tightness and wheezing.   Cardiovascular: Negative for chest pain, palpitations and leg swelling.  Gastrointestinal: Negative for abdominal pain, diarrhea, constipation and abdominal distention.  Genitourinary: Negative.   Musculoskeletal: Positive for gait problem.  Neurological: Negative for dizziness, weakness, light-headedness and numbness.  Psychiatric/Behavioral: Negative.       Objective:   Physical Exam  Constitutional: She appears well-developed and well-nourished. No distress.  HENT:  Head: Normocephalic and atraumatic.  Eyes: EOM are normal.  Neck: Normal range of motion. Neck supple.  Cardiovascular: Normal rate and regular rhythm.   Pulmonary/Chest: Effort normal and breath sounds normal. No respiratory distress. She has no wheezes. She has no rales. She exhibits no tenderness.  Abdominal: Soft. Bowel sounds are normal.  Neurological: Coordination abnormal.  Uses wheelchair to ambulate.  Skin: Skin is warm and dry.   Filed Vitals:   03/15/14 1005  BP: 122/82  Pulse: 78  Temp: 97.7 F (36.5 C)  TempSrc: Oral  Resp: 14   Weight: 245 lb (111.131 kg)  SpO2: 97%      Assessment & Plan:

## 2014-03-15 NOTE — Assessment & Plan Note (Signed)
Doing well off metoprolol. She has been taken off the crestor and will check lipid panel today.

## 2014-03-15 NOTE — Assessment & Plan Note (Signed)
Recheck lipid panel today, last LDL on crestor was 101.

## 2014-05-01 DIAGNOSIS — B373 Candidiasis of vulva and vagina: Secondary | ICD-10-CM | POA: Diagnosis not present

## 2014-05-01 DIAGNOSIS — N760 Acute vaginitis: Secondary | ICD-10-CM | POA: Diagnosis not present

## 2014-05-02 ENCOUNTER — Encounter (HOSPITAL_COMMUNITY): Payer: Self-pay

## 2014-05-02 DIAGNOSIS — Z1231 Encounter for screening mammogram for malignant neoplasm of breast: Secondary | ICD-10-CM | POA: Diagnosis not present

## 2014-05-24 ENCOUNTER — Ambulatory Visit: Payer: Medicare Other | Admitting: Internal Medicine

## 2014-06-05 ENCOUNTER — Ambulatory Visit (INDEPENDENT_AMBULATORY_CARE_PROVIDER_SITE_OTHER): Payer: Medicare Other | Admitting: Internal Medicine

## 2014-06-05 ENCOUNTER — Other Ambulatory Visit: Payer: Self-pay

## 2014-06-05 ENCOUNTER — Encounter: Payer: Self-pay | Admitting: Internal Medicine

## 2014-06-05 VITALS — BP 138/68 | HR 79 | Ht 68.0 in

## 2014-06-05 DIAGNOSIS — I471 Supraventricular tachycardia: Secondary | ICD-10-CM

## 2014-06-05 DIAGNOSIS — E785 Hyperlipidemia, unspecified: Secondary | ICD-10-CM

## 2014-06-05 NOTE — Assessment & Plan Note (Signed)
Since her ablation, she has no recurrent SVT. She will undergo watchful waiting. She is off of beta blocker or calcium channel blockers.

## 2014-06-05 NOTE — Assessment & Plan Note (Signed)
She is encouraged to reduce her fat intake and lose weight.
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
## 2014-06-05 NOTE — Patient Instructions (Signed)
Medication Instructions:  Your physician recommends that you continue on your current medications as directed. Please refer to the Current Medication list given to you today.   Labwork: None ordered   Testing/Procedures: None ordered   Follow-Up: Your physician recommends that you schedule a follow-up appointment as needed    Any Other Special Instructions Will Be Listed Below (If Applicable).   

## 2014-06-18 ENCOUNTER — Encounter: Payer: Self-pay | Admitting: Internal Medicine

## 2014-06-18 ENCOUNTER — Ambulatory Visit (INDEPENDENT_AMBULATORY_CARE_PROVIDER_SITE_OTHER): Payer: Medicare Other | Admitting: Internal Medicine

## 2014-06-18 VITALS — BP 142/84 | HR 70 | Temp 98.3°F | Resp 16 | Wt 253.0 lb

## 2014-06-18 DIAGNOSIS — R030 Elevated blood-pressure reading, without diagnosis of hypertension: Secondary | ICD-10-CM | POA: Diagnosis not present

## 2014-06-18 DIAGNOSIS — E785 Hyperlipidemia, unspecified: Secondary | ICD-10-CM | POA: Diagnosis not present

## 2014-06-18 DIAGNOSIS — R682 Dry mouth, unspecified: Secondary | ICD-10-CM | POA: Diagnosis not present

## 2014-06-18 LAB — GLUCOSE, POCT (MANUAL RESULT ENTRY): POC GLUCOSE: 94 mg/dL (ref 70–99)

## 2014-06-18 NOTE — Progress Notes (Signed)
Pre visit review using our clinic review tool, if applicable. No additional management support is needed unless otherwise documented below in the visit note. 

## 2014-06-18 NOTE — Progress Notes (Signed)
   Subjective:    Patient ID: Sara Hunt, female    DOB: [birth year], 39 y.o.   MRN: [id number]  HPI She has had dry mouth for 4 days without specific trigger. She's concerned this might be related to elevated blood sugars. Her mother is a diabetic.  Glucose today was 94.  She has not been increasing her fluid intake despite being outside in the heat recently. The dry mouth is not associated with oral lesions or dental decay.   She has history of supraventricular tachycardia for which she had an ablation.  She had been on a beta blocker for this but not hypertension.  In March of this year her LDL was 171. Her maternal grandmother stroke at 55   Review of Systems  She denies polyuria, polyphagia, polydipsia. She does not have dry eyes. She is due for ophthalmologic exam.  She denies any extremity numbness or tingling. She has no significant arthralgias.   She's had no change in her skin, nails, or hair. She does not have cold intolerance.     Objective:   Physical Exam  Pertinent or positive findings include: She is in a wheelchair because of neurodegenerative gait disorder. Clinically there is no evidence of oral or glossal drying. She has a regular rhythm and rate. She has trace-1/2+ edema mainly in the left lower extremity. The posterior tibial pulses are equal but decreased.  General appearance :adequately nourished; in no distress.  Eyes: No conjunctival inflammation or scleral icterus is present.  Oral exam:  Lips and gums are healthy appearing.There is no oropharyngeal erythema or exudate noted. Dental hygiene is good.  Heart:  Normal rate and regular rhythm. S1 and S2 normal without gallop, murmur, click, rub or other extra sounds    Lungs:Chest clear to auscultation; no wheezes, rhonchi,rales ,or rubs present.No increased work of breathing.   Abdomen: bowel sounds normal, soft and non-tender without masses, organomegaly or hernias noted.  No guarding or rebound.     Vascular : all pulses equal ; no bruits present.  Skin:Warm & dry.  Intact without suspicious lesions or rashes ; no tenting   Lymphatic: No lymphadenopathy is noted about the head, neck, axilla   Neuro: Strength, tone grossly normal.       Assessment & Plan:  #1 subjective xerostomia; Ophthmalogy evaluation recommended  #2 elevated blood pressure without diagnosis of hypertension  #3 dyslipidemia  Plan: See after visit summary and orders

## 2014-06-18 NOTE — Patient Instructions (Addendum)
Please see your Ophthalmologist.  Minimal Blood Pressure Goal= AVERAGE < 140/90;  Ideal is an AVERAGE < 135/85. This AVERAGE should be calculated from @ least 5-7 BP readings taken @ different times of day on different days of week. You should not respond to isolated BP readings , but rather the AVERAGE for that week .Please bring your  blood pressure cuff to office visits to verify that it is reliable.It  can also be checked against the blood pressure device at the pharmacy. Finger or wrist cuffs are not dependable; an arm cuff is.   Please follow a Mediaterranean type diet  (many good cook books readily available) or review Dr Nunzio Cory book Eat, [place] for best  dietary cholesterol information & options.  Cardiovascular exercise, this can be as simple a program as water aerobics or recumbent bike, is recommended 30-45 minutes 3-4 times per week. If you're not exercising you should take 6-8 weeks to build up to this level.   An advanced cholesterol panel (NMR Lipoprofile Lipid Panel) after 4 months of nutritional & exercise changes is best way to optimally assess long LDL risk.

## 2014-07-17 ENCOUNTER — Telehealth: Payer: Self-pay | Admitting: Internal Medicine

## 2014-07-17 NOTE — Telephone Encounter (Signed)
Patient has a hx of GERD and is not currently taking any medications.  I have advised her to try OTC medications and call her PCP to be evaluated for this

## 2014-07-17 NOTE — Telephone Encounter (Signed)
New Message        Pt calling stating that she has a feeling like she is having heart burn and it is only concerning her because it has not gone away since yesterday and it is lingering. Please call back and advise.

## 2014-07-26 ENCOUNTER — Ambulatory Visit: Payer: Self-pay | Admitting: Internal Medicine

## 2014-08-29 ENCOUNTER — Other Ambulatory Visit (INDEPENDENT_AMBULATORY_CARE_PROVIDER_SITE_OTHER): Payer: Medicare Other

## 2014-08-29 ENCOUNTER — Ambulatory Visit (INDEPENDENT_AMBULATORY_CARE_PROVIDER_SITE_OTHER): Payer: Medicare Other | Admitting: Internal Medicine

## 2014-08-29 ENCOUNTER — Encounter: Payer: Self-pay | Admitting: Internal Medicine

## 2014-08-29 DIAGNOSIS — E785 Hyperlipidemia, unspecified: Secondary | ICD-10-CM

## 2014-08-29 DIAGNOSIS — E669 Obesity, unspecified: Secondary | ICD-10-CM

## 2014-08-29 LAB — LIPID PANEL
Cholesterol: 257 mg/dL — ABNORMAL HIGH (ref 0–200)
HDL: 34 mg/dL — ABNORMAL LOW (ref >39.00)
LDL Cholesterol: 212 mg/dL — ABNORMAL HIGH (ref 0–99)
NonHDL: 223.44
Total CHOL/HDL Ratio: 8
Triglycerides: 59 mg/dL (ref 0.0–149.0)
VLDL: 11.8 mg/dL (ref 0.0–40.0)

## 2014-08-29 LAB — COMPREHENSIVE METABOLIC PANEL
ALT: 11 U/L (ref 0–35)
AST: 15 U/L (ref 0–37)
Albumin: 3.5 g/dL (ref 3.5–5.2)
Alkaline Phosphatase: 51 U/L (ref 39–117)
BILIRUBIN TOTAL: 0.4 mg/dL (ref 0.2–1.2)
BUN: 13 mg/dL (ref 6–23)
CHLORIDE: 102 meq/L (ref 96–112)
CO2: 30 meq/L (ref 19–32)
Calcium: 9.1 mg/dL (ref 8.4–10.5)
Creatinine, Ser: 0.77 mg/dL (ref 0.40–1.20)
GFR: 107.65 mL/min (ref >60.00)
GLUCOSE: 88 mg/dL (ref 70–99)
Potassium: 3.7 mEq/L (ref 3.5–5.1)
SODIUM: 136 meq/L (ref 135–145)
Total Protein: 7.7 g/dL (ref 6.0–8.3)

## 2014-08-29 LAB — HEMOGLOBIN A1C: Hgb A1c MFr Bld: 4.9 % (ref 4.6–6.5)

## 2014-08-29 MED ORDER — NORETHINDRONE 0.35 MG PO TABS: 1.0000 | ORAL_TABLET | Freq: Every day | ORAL | Status: DC

## 2014-08-29 NOTE — Progress Notes (Signed)
Pre visit review using our clinic review tool, if applicable. No additional management support is needed unless otherwise documented below in the visit note. 

## 2014-08-30 ENCOUNTER — Encounter: Payer: Self-pay | Admitting: Internal Medicine

## 2014-08-30 ENCOUNTER — Other Ambulatory Visit: Payer: Self-pay | Admitting: Internal Medicine

## 2014-08-30 MED ORDER — ROSUVASTATIN CALCIUM 10 MG PO TABS
10.0000 mg | ORAL_TABLET | Freq: Every day | ORAL | Status: DC
Start: 2014-08-30 — End: 2015-09-08

## 2014-08-30 NOTE — Patient Instructions (Signed)
We are checking your labs and will call you back with the results.

## 2014-08-30 NOTE — Assessment & Plan Note (Signed)
Repeat lipid panel today and if needed start statin.

## 2014-08-30 NOTE — Progress Notes (Signed)
   Subjective:    Patient ID: Sara Hunt, female    DOB: 09/26/1975, 39 y.o.   MRN: [id number]  HPI The patient is a 39 YO female coming in to discuss her weight and her cholesterol. She has been working on changing her diet but has not noticed any results. She has been thinking about a gastric sleeve or something to help with her weight. She has had several friends that have had this done. She is weary of medicines for weight loss due to her previous SVT. She would rather avoid cholesterol medication had has successfully been on crestor in the past without problems.   Review of Systems  Constitutional: Negative for fever, activity change, appetite change and fatigue.  HENT: Negative for congestion.   Respiratory: Negative for cough, chest tightness and wheezing.   Cardiovascular: Negative for chest pain, palpitations and leg swelling.  Gastrointestinal: Negative for abdominal pain, diarrhea, constipation and abdominal distention.  Genitourinary: Negative.   Musculoskeletal: Positive for gait problem.  Neurological: Negative for dizziness, weakness, light-headedness and numbness.  Psychiatric/Behavioral: Negative.       Objective:   Physical Exam  Constitutional: She appears well-developed and well-nourished. No distress.  HENT:  Head: Normocephalic and atraumatic.  Eyes: EOM are normal.  Neck: Normal range of motion. Neck supple.  Cardiovascular: Normal rate and regular rhythm.   Pulmonary/Chest: Effort normal and breath sounds normal. No respiratory distress. She has no wheezes. She has no rales. She exhibits no tenderness.  Abdominal: Soft. Bowel sounds are normal.  Neurological: Coordination abnormal.  Uses wheelchair to ambulate.  Skin: Skin is warm and dry.   Filed Vitals:   08/29/14 1059  BP: 120/78  Pulse: 77  Temp: 98.2 F (36.8 C)  TempSrc: Oral  Resp: 14  SpO2: 98%      Assessment & Plan:

## 2014-08-30 NOTE — Assessment & Plan Note (Signed)
Information given to her about the bariatric clinic so she can work on weight loss and talk about surgical options. Her BMI is not greater than 40 at this point. Checking for complications with CBS4H, lipid panel, CMP.

## 2014-09-19 ENCOUNTER — Ambulatory Visit (INDEPENDENT_AMBULATORY_CARE_PROVIDER_SITE_OTHER): Payer: Medicare Other | Admitting: Family

## 2014-09-19 ENCOUNTER — Encounter: Payer: Self-pay | Admitting: Family

## 2014-09-19 VITALS — BP 118/80 | HR 80 | Temp 97.9°F | Resp 18

## 2014-09-19 DIAGNOSIS — J019 Acute sinusitis, unspecified: Secondary | ICD-10-CM | POA: Diagnosis not present

## 2014-09-19 MED ORDER — AMOXICILLIN-POT CLAVULANATE 875-125 MG PO TABS: 1.0000 | ORAL_TABLET | Freq: Two times a day (BID) | ORAL | Status: DC

## 2014-09-19 NOTE — Patient Instructions (Signed)
Thank you for choosing [facility].  Summary/Instructions:  Your prescription(s) have been submitted to your pharmacy or been printed and provided for you. Please take as directed and contact our office if you believe you are having problem(s) with the medication(s) or have any questions.  If your symptoms worsen or fail to improve, please contact our office for further instruction, or in case of emergency go directly to the emergency room at the closest medical facility.   General Recommendations:    Please drink plenty of fluids.  Get plenty of rest   Sleep in humidified air  Use saline nasal sprays  Netti pot   OTC Medications:  Decongestants - helps relieve congestion   Flonase (generic fluticasone) or Nasacort (generic triamcinolone) - please make sure to use the "cross-over" technique at a 45 degree angle towards the opposite eye as opposed to straight up the nasal passageway.   Sudafed (generic pseudoephedrine - Note this is the one that is available behind the pharmacy counter); Products with phenylephrine (-PE) may also be used but is often not as effective as pseudoephedrine.   If you have HIGH BLOOD PRESSURE - Coricidin HBP; AVOID any product that is -D as this contains pseudoephedrine which may increase your blood pressure.  Afrin (oxymetazoline) every 6-8 hours for up to 3 days.   Allergies - helps relieve runny nose, itchy eyes and sneezing   Claritin (generic loratidine), Allegra (fexofenidine), or Zyrtec (generic cyrterizine) for runny nose. These medications should not cause drowsiness.  Note - Benadryl (generic diphenhydramine) may be used however may cause drowsiness  Cough -   Delsym or Robitussin (generic dextromethorphan)  Expectorants - helps loosen mucus to ease removal   Mucinex (generic guaifenesin) as directed on the package.  Headaches / General Aches   Tylenol (generic acetaminophen) - DO NOT EXCEED 3 grams (3,000 mg) in a 24  hour time period  Advil/Motrin (generic ibuprofen)   Sore Throat -   Salt water gargle   Chloraseptic (generic benzocaine) spray or lozenges / Sucrets (generic dyclonine)    Sinusitis Sinusitis is redness, soreness, and inflammation of the paranasal sinuses. Paranasal sinuses are air pockets within the bones of your face (beneath the eyes, the middle of the forehead, or above the eyes). In healthy paranasal sinuses, mucus is able to drain out, and air is able to circulate through them by way of your nose. However, when your paranasal sinuses are inflamed, mucus and air can become trapped. This can allow bacteria and other germs to grow and cause infection. Sinusitis can develop quickly and last only a short time (acute) or continue over a long period (chronic). Sinusitis that lasts for more than 12 weeks is considered chronic.  CAUSES  Causes of sinusitis include:  Allergies.  Structural abnormalities, such as displacement of the cartilage that separates your nostrils (deviated septum), which can decrease the air flow through your nose and sinuses and affect sinus drainage.  Functional abnormalities, such as when the small hairs (cilia) that line your sinuses and help remove mucus do not work properly or are not present. SIGNS AND SYMPTOMS  Symptoms of acute and chronic sinusitis are the same. The primary symptoms are pain and pressure around the affected sinuses. Other symptoms include:  Upper toothache.  Earache.  Headache.  Bad breath.  Decreased sense of smell and taste.  A cough, which worsens when you are lying flat.  Fatigue.  Fever.  Thick drainage from your nose, which often is green and may   contain pus (purulent).  Swelling and warmth over the affected sinuses. DIAGNOSIS  Your health care provider will perform a physical exam. During the exam, your health care provider may:  Look in your nose for signs of abnormal growths in your nostrils (nasal  polyps).  Tap over the affected sinus to check for signs of infection.  View the inside of your sinuses (endoscopy) using an imaging device that has a light attached (endoscope). If your health care provider suspects that you have chronic sinusitis, one or more of the following tests may be recommended:  Allergy tests.  Nasal culture. A sample of mucus is taken from your nose, sent to a lab, and screened for bacteria.  Nasal cytology. A sample of mucus is taken from your nose and examined by your health care provider to determine if your sinusitis is related to an allergy. TREATMENT  Most cases of acute sinusitis are related to a viral infection and will resolve on their own within 10 days. Sometimes medicines are prescribed to help relieve symptoms (pain medicine, decongestants, nasal steroid sprays, or saline sprays).  However, for sinusitis related to a bacterial infection, your health care provider will prescribe antibiotic medicines. These are medicines that will help kill the bacteria causing the infection.  Rarely, sinusitis is caused by a fungal infection. In theses cases, your health care provider will prescribe antifungal medicine. For some cases of chronic sinusitis, surgery is needed. Generally, these are cases in which sinusitis recurs more than 3 times per year, despite other treatments. HOME CARE INSTRUCTIONS   Drink plenty of water. Water helps thin the mucus so your sinuses can drain more easily.  Use a humidifier.  Inhale steam 3 to 4 times a day (for example, sit in the bathroom with the shower running).  Apply a warm, moist washcloth to your face 3 to 4 times a day, or as directed by your health care provider.  Use saline nasal sprays to help moisten and clean your sinuses.  Take medicines only as directed by your health care provider.  If you were prescribed either an antibiotic or antifungal medicine, finish it all even if you start to feel better. SEEK IMMEDIATE  MEDICAL CARE IF:  You have increasing pain or severe headaches.  You have nausea, vomiting, or drowsiness.  You have swelling around your face.  You have vision problems.  You have a stiff neck.  You have difficulty breathing. MAKE SURE YOU:   Understand these instructions.  Will watch your condition.  Will get help right away if you are not doing well or get worse. Document Released: 12/22/2004 Document Revised: 05/08/2013 Document Reviewed: 01/06/2011 ExitCare Patient Information 2015 [employer]. This information is not intended to replace advice given to you by your health care provider. Make sure you discuss any questions you have with your health care provider.   

## 2014-09-19 NOTE — Progress Notes (Signed)
   Subjective:    Patient ID: Sara Hunt, female    DOB: 1975/09/14, 39 y.o.   MRN: [id number]  Chief Complaint  Patient presents with  . 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 female with a PMH of obesity, vitamin D deficiency, HPV, hyperlipidemia, migraines, and GERD who presents today for an acute office visit.   This is a new problem. Associated symptoms of sinus pressure, drainage, productive cough, sore throat and wheezing has been going on for about 2 weeks. Denies any modifying treatments attempted. Indicates that the symptoms come and go with timing of the symptoms being worst in the morning. Denies any recent antibiotic use.   Allergies  Allergen Reactions  . Latex Rash    Current Outpatient Prescriptions on File Prior to Visit  Medication Sig Dispense Refill  . ibuprofen (ADVIL,MOTRIN) 200 MG tablet Take 400-600 mg by mouth every 6 (six) hours as needed for headache or moderate pain.     . Multiple Vitamin (MULTIVITAMIN WITH MINERALS) TABS tablet Take 1 tablet by mouth daily.    . norethindrone (MICRONOR,CAMILA,ERRIN) 0.35 MG tablet Take 1 tablet (0.35 mg total) by mouth daily. 3 Package 3  . rosuvastatin (CRESTOR) 10 MG tablet Take 1 tablet (10 mg total) by mouth daily. 90 tablet 3   No current facility-administered medications on file prior to visit.    Review of Systems  Constitutional: Negative for fever and chills.  HENT: Positive for congestion, ear pain, sinus pressure and sore throat.   Respiratory: Positive for cough and wheezing. Negative for chest tightness.   Neurological: Positive for headaches.      Objective:    BP 118/80 mmHg  Pulse 80  Temp(Src) 97.9 F (36.6 C) (Oral)  Resp 18  Ht   Wt   SpO2 97%  LMP 12/24/2013 Nursing note and vital signs reviewed.  Physical Exam  Constitutional: She is  oriented to person, place, and time. She appears well-developed and well-nourished. No distress.  HENT:  Right Ear: Hearing, tympanic membrane, external ear and ear canal normal.  Left Ear: Hearing, tympanic membrane, external ear and ear canal normal.  Nose: Right sinus exhibits maxillary sinus tenderness and frontal sinus tenderness. Left sinus exhibits maxillary sinus tenderness and frontal sinus tenderness.  Mouth/Throat: Uvula is midline, oropharynx is clear and moist and mucous membranes are normal.  Cardiovascular: Normal rate, regular rhythm, normal heart sounds and intact distal pulses.   Pulmonary/Chest: Effort normal and breath sounds normal.  Neurological: She is alert and oriented to person, place, and time.  Skin: Skin is warm and dry.  Psychiatric: She has a normal mood and affect. Her behavior is normal. Judgment and thought content normal.       Assessment & Plan:   Problem List Items Addressed This Visit      Respiratory   Sinusitis, acute - Primary    Symptoms and exam consistent with bacterial sinusitis. Start Augmentin. Start over-the-counter medications as needed for symptom relief and supportive care. Follow up if symptoms worsen or fail to improve.      Relevant Medications   amoxicillin-clavulanate (AUGMENTIN) 875-125 MG per tablet

## 2014-09-19 NOTE — Assessment & Plan Note (Signed)
Symptoms and exam consistent with bacterial sinusitis. Start Augmentin. Start over-the-counter medications as needed for symptom relief and supportive care. Follow up if symptoms worsen or fail to improve.

## 2014-09-24 ENCOUNTER — Telehealth: Payer: Self-pay | Admitting: Internal Medicine

## 2014-09-24 MED ORDER — LEVOFLOXACIN 500 MG PO TABS: 500.0000 mg | ORAL_TABLET | Freq: Every day | ORAL | Status: DC

## 2014-09-24 NOTE — Telephone Encounter (Signed)
Patient called to advise that she is experiencing side effects of   amoxicillin-clavulanate (AUGMENTIN) 875-125 MG per tablet [[id number]]     Specifically diarrhea. She requests another medication instead that wont give her this side effect. Verified pharmacy is [facility] on [address].

## 2014-09-24 NOTE — Telephone Encounter (Signed)
Levofloxacin sent to pharmacy

## 2014-09-25 ENCOUNTER — Encounter: Payer: Self-pay | Admitting: Internal Medicine

## 2014-09-25 ENCOUNTER — Ambulatory Visit (INDEPENDENT_AMBULATORY_CARE_PROVIDER_SITE_OTHER): Payer: Medicare Other | Admitting: Internal Medicine

## 2014-09-25 VITALS — BP 144/82 | HR 89 | Temp 99.5°F | Resp 16

## 2014-09-25 DIAGNOSIS — R197 Diarrhea, unspecified: Secondary | ICD-10-CM

## 2014-09-25 NOTE — Progress Notes (Signed)
   Subjective:    Patient ID: Sara Hunt, female    DOB: 03-18-1975, 39 y.o.   MRN: [id number]  HPI The patient is a 39 YO female coming in with diarrhea. She was seen last week for sinus infection and given antibiotics. She started taking them and within 2 days started having diarrhea which persisted and worsened. She then stopped the antibiotic and thought it was getting a little better but still present. Moving her bowels with diarrhea after eating. Mild fever last night, did not take anything and gone today. Not foul smelling and 4-5 times per day. Eating less but drinking fluids okay. No abdominal pain but some pain with defecation due to irritation.   Review of Systems  Constitutional: Positive for fever and appetite change. Negative for activity change and fatigue.  HENT: Negative for congestion.   Respiratory: Negative for cough, chest tightness and wheezing.   Cardiovascular: Negative for chest pain, palpitations and leg swelling.  Gastrointestinal: Positive for nausea and diarrhea. Negative for vomiting, abdominal pain, constipation and abdominal distention.  Genitourinary: Negative.   Musculoskeletal: Positive for gait problem.  Neurological: Negative for dizziness, weakness, light-headedness and numbness.  Psychiatric/Behavioral: Negative.       Objective:   Physical Exam  Constitutional: She appears well-developed and well-nourished. No distress.  HENT:  Head: Normocephalic and atraumatic.  Eyes: EOM are normal.  Neck: Normal range of motion. Neck supple.  Cardiovascular: Normal rate and regular rhythm.   Pulmonary/Chest: Effort normal and breath sounds normal. No respiratory distress. She has no wheezes. She has no rales. She exhibits no tenderness.  Abdominal: Soft. Bowel sounds are normal.  Neurological: Coordination abnormal.  Uses wheelchair to ambulate.  Skin: Skin is warm and dry.   Filed Vitals:   09/25/14 1503  BP: 144/82  Pulse: 89  Temp: 99.5 F (37.5  C)  TempSrc: Oral  Resp: 16  SpO2: 96%      Assessment & Plan:

## 2014-09-25 NOTE — Progress Notes (Signed)
Pre visit review using our clinic review tool, if applicable. No additional management support is needed unless otherwise documented below in the visit note. 

## 2014-09-25 NOTE — Patient Instructions (Signed)
You can use imodium for the diarrhea. You can take it before meals to help prevent the diarrhea. You can take more if you get diarrhea. When you get constipated back off because you are taking too much.   You can take tylenol or ibuprofen for the fevers.   If you are no better on Thursday call us and we will have you bring a stool sample to the lab to check for infection.   Food Choices to Help Relieve Diarrhea When you have diarrhea, the foods you eat and your eating habits are very important. Choosing the right foods and drinks can help relieve diarrhea. Also, because diarrhea can last up to 7 days, you need to replace lost fluids and electrolytes (such as sodium, potassium, and chloride) in order to help prevent dehydration.  WHAT GENERAL GUIDELINES DO I NEED TO FOLLOW?  Slowly drink 1 cup (8 oz) of fluid for each episode of diarrhea. If you are getting enough fluid, your urine will be clear or pale yellow.  Eat starchy foods. Some good choices include white rice, white toast, pasta, low-fiber cereal, baked potatoes (without the skin), saltine crackers, and bagels.  Avoid large servings of any cooked vegetables.  Limit fruit to two servings per day. A serving is  cup or 1 small piece.  Choose foods with less than 2 g of fiber per serving.  Limit fats to less than 8 tsp (38 g) per day.  Avoid fried foods.  Eat foods that have probiotics in them. Probiotics can be found in certain dairy products.  Avoid foods and beverages that may increase the speed at which food moves through the stomach and intestines (gastrointestinal tract). Things to avoid include:  High-fiber foods, such as dried fruit, raw fruits and vegetables, nuts, seeds, and whole grain foods.  Spicy foods and high-fat foods.  Foods and beverages sweetened with high-fructose corn syrup, honey, or sugar alcohols such as xylitol, sorbitol, and mannitol. WHAT FOODS ARE RECOMMENDED? Grains White rice. White, [place], or  pita breads (fresh or toasted), including plain rolls, buns, or bagels. White pasta. Saltine, soda, or graham crackers. Pretzels. Low-fiber cereal. Cooked cereals made with water (such as cornmeal, farina, or cream cereals). Plain muffins. Matzo. Melba toast. Zwieback.  Vegetables Potatoes (without the skin). Strained tomato and vegetable juices. Most well-cooked and canned vegetables without seeds. Tender lettuce. Fruits Cooked or canned applesauce, apricots, cherries, fruit cocktail, grapefruit, peaches, pears, or plums. Fresh bananas, apples without skin, cherries, grapes, cantaloupe, grapefruit, peaches, oranges, or plums.  Meat and Other Protein Products Baked or boiled chicken. Eggs. Tofu. Fish. Seafood. Smooth peanut butter. Ground or well-cooked tender beef, ham, veal, lamb, pork, or poultry.  Dairy Plain yogurt, kefir, and unsweetened liquid yogurt. Lactose-free milk, buttermilk, or soy milk. Plain hard cheese. Beverages Sport drinks. Clear broths. Diluted fruit juices (except prune). Regular, caffeine-free sodas such as ginger ale. Water. Decaffeinated teas. Oral rehydration solutions. Sugar-free beverages not sweetened with sugar alcohols. Other Bouillon, broth, or soups made from recommended foods.  The items listed above may not be a complete list of recommended foods or beverages. Contact your dietitian for more options. WHAT FOODS ARE NOT RECOMMENDED? Grains Whole grain, whole wheat, bran, or rye breads, rolls, pastas, crackers, and cereals. Wild or brown rice. Cereals that contain more than 2 g of fiber per serving. Corn tortillas or taco shells. Cooked or dry oatmeal. Granola. Popcorn. Vegetables Raw vegetables. Cabbage, broccoli, Brussels sprouts, artichokes, baked beans, beet greens, corn, kale, legumes, peas,  sweet potatoes, and yams. Potato skins. Cooked spinach and cabbage. Fruits Dried fruit, including raisins and dates. Raw fruits. Stewed or dried prunes. Fresh apples with  skin, apricots, mangoes, pears, raspberries, and strawberries.  Meat and Other Protein Products Chunky peanut butter. Nuts and seeds. Beans and lentils. Berniece Salines.  Dairy High-fat cheeses. Milk, chocolate milk, and beverages made with milk, such as milk shakes. Cream. Ice cream. Sweets and Desserts Sweet rolls, doughnuts, and sweet breads. Pancakes and waffles. Fats and Oils Butter. Cream sauces. Margarine. Salad oils. Plain salad dressings. Olives. Avocados.  Beverages Caffeinated beverages (such as coffee, tea, soda, or energy drinks). Alcoholic beverages. Fruit juices with pulp. Prune juice. Soft drinks sweetened with high-fructose corn syrup or sugar alcohols. Other Coconut. Hot sauce. Chili powder. Mayonnaise. Gravy. Cream-based or milk-based soups.  The items listed above may not be a complete list of foods and beverages to avoid. Contact your dietitian for more information. WHAT SHOULD I DO IF I BECOME DEHYDRATED? Diarrhea can sometimes lead to dehydration. Signs of dehydration include dark urine and dry mouth and skin. If you think you are dehydrated, you should rehydrate with an oral rehydration solution. These solutions can be purchased at pharmacies, retail stores, or online.  Drink -1 cup (120-240 mL) of oral rehydration solution each time you have an episode of diarrhea. If drinking this amount makes your diarrhea worse, try drinking smaller amounts more often. For example, drink 1-3 tsp (5-15 mL) every 5-10 minutes.  A general rule for staying hydrated is to drink 1-2 L of fluid per day. Talk to your health care provider about the specific amount you should be drinking each day. Drink enough fluids to keep your urine clear or pale yellow. Document Released: 03/14/2003 Document Revised: 12/27/2012 Document Reviewed: 11/14/2012 [facility] [employer] Patient Information 2015 [place], [place]. This information is not intended to replace advice given to you by your health care provider. Make sure you  discuss any questions you have with your health care provider.

## 2014-09-27 DIAGNOSIS — R197 Diarrhea, unspecified: Secondary | ICD-10-CM | POA: Insufficient documentation

## 2014-09-27 NOTE — Assessment & Plan Note (Signed)
Diarrhea likely caused by the augmentin. She did stop the medicine and has not taken the levaquin. Her sinuses do not appear infected today so have advised her to stop all antibiotics and not resume. Stay hydrated and BRAT diet for the diarrhea. Can use imodium if needed. If still having diarrhea by Friday she will call the office and we will check her for C dif. This should improve gradually back to normal. She can use probiotics to help as well.

## 2014-10-01 DIAGNOSIS — D259 Leiomyoma of uterus, unspecified: Secondary | ICD-10-CM | POA: Diagnosis not present

## 2014-10-12 ENCOUNTER — Encounter (HOSPITAL_COMMUNITY): Payer: Self-pay | Admitting: *Deleted

## 2014-10-12 ENCOUNTER — Emergency Department (HOSPITAL_COMMUNITY)
Admission: EM | Admit: 2014-10-12 | Discharge: 2014-10-12 | Disposition: A | Discharge status: Home / Self Care | Payer: 59 | Attending: Emergency Medicine | Admitting: Emergency Medicine

## 2014-10-12 DIAGNOSIS — Z91040 Latex allergy status: Secondary | ICD-10-CM | POA: Diagnosis not present

## 2014-10-12 DIAGNOSIS — J45909 Unspecified asthma, uncomplicated: Secondary | ICD-10-CM | POA: Insufficient documentation

## 2014-10-12 DIAGNOSIS — Z79899 Other long term (current) drug therapy: Secondary | ICD-10-CM | POA: Diagnosis not present

## 2014-10-12 DIAGNOSIS — Z8719 Personal history of other diseases of the digestive system: Secondary | ICD-10-CM | POA: Insufficient documentation

## 2014-10-12 DIAGNOSIS — E785 Hyperlipidemia, unspecified: Secondary | ICD-10-CM | POA: Diagnosis not present

## 2014-10-12 DIAGNOSIS — Z8679 Personal history of other diseases of the circulatory system: Secondary | ICD-10-CM | POA: Diagnosis not present

## 2014-10-12 DIAGNOSIS — Z87891 Personal history of nicotine dependence: Secondary | ICD-10-CM | POA: Insufficient documentation

## 2014-10-12 DIAGNOSIS — R1011 Right upper quadrant pain: Secondary | ICD-10-CM | POA: Diagnosis not present

## 2014-10-12 DIAGNOSIS — R109 Unspecified abdominal pain: Secondary | ICD-10-CM | POA: Insufficient documentation

## 2014-10-12 DIAGNOSIS — Z3202 Encounter for pregnancy test, result negative: Secondary | ICD-10-CM | POA: Insufficient documentation

## 2014-10-12 DIAGNOSIS — R1031 Right lower quadrant pain: Secondary | ICD-10-CM | POA: Diagnosis not present

## 2014-10-12 LAB — COMPREHENSIVE METABOLIC PANEL
ALBUMIN: 3.1 g/dL — AB (ref 3.5–5.0)
ALK PHOS: 56 U/L (ref 38–126)
ALT: 14 U/L (ref 14–54)
ANION GAP: 5 (ref 5–15)
AST: 19 U/L (ref 15–41)
BILIRUBIN TOTAL: 0.5 mg/dL (ref 0.3–1.2)
BUN: 13 mg/dL (ref 6–20)
CALCIUM: 9.2 mg/dL (ref 8.9–10.3)
CO2: 28 mmol/L (ref 22–32)
CREATININE: 0.8 mg/dL (ref 0.44–1.00)
Chloride: 103 mmol/L (ref 101–111)
GFR calc Af Amer: >60 mL/min (ref >60)
GFR calc non Af Amer: >60 mL/min (ref >60)
GLUCOSE: 91 mg/dL (ref 65–99)
Potassium: 4 mmol/L (ref 3.5–5.1)
SODIUM: 136 mmol/L (ref 135–145)
TOTAL PROTEIN: 7.8 g/dL (ref 6.5–8.1)

## 2014-10-12 LAB — URINALYSIS, ROUTINE W REFLEX MICROSCOPIC
BILIRUBIN URINE: NEGATIVE
GLUCOSE, UA: NEGATIVE mg/dL
Ketones, ur: NEGATIVE mg/dL
Leukocytes, UA: NEGATIVE
Nitrite: NEGATIVE
PROTEIN: NEGATIVE mg/dL
Specific Gravity, Urine: 1.023 (ref 1.005–1.030)
UROBILINOGEN UA: 0.2 mg/dL (ref 0.0–1.0)
pH: 6.5 (ref 5.0–8.0)

## 2014-10-12 LAB — LIPASE, BLOOD: Lipase: 38 U/L (ref 22–51)

## 2014-10-12 LAB — CBC
HCT: 36.2 % (ref 36.0–46.0)
HEMOGLOBIN: 11.8 g/dL — AB (ref 12.0–15.0)
MCH: 31.2 pg (ref 26.0–34.0)
MCHC: 32.6 g/dL (ref 30.0–36.0)
MCV: 95.8 fL (ref 78.0–100.0)
Platelets: 245 10*3/uL (ref 150–400)
RBC: 3.78 MIL/uL — ABNORMAL LOW (ref 3.87–5.11)
RDW: 12.1 % (ref 11.5–15.5)
WBC: 8.5 10*3/uL (ref 4.0–10.5)

## 2014-10-12 LAB — URINE MICROSCOPIC-ADD ON

## 2014-10-12 LAB — POC URINE PREG, ED: PREG TEST UR: NEGATIVE

## 2014-10-12 MED ORDER — IBUPROFEN 800 MG PO TABS
800.0000 mg | ORAL_TABLET | Freq: Once | ORAL | Status: AC
  Administered 2014-10-12: 800 mg via ORAL
  Filled 2014-10-12: qty 1

## 2014-10-12 NOTE — ED Notes (Signed)
Pt A&OX4, NAD, states she has all of her belongings with her at d/c. Pt wheeled out of ED via wheelchair.

## 2014-10-12 NOTE — ED Notes (Signed)
The pt is c/o abd pain with some low back pain for 4 months.  Her pain has become worse for the past 2 weeks.  lmp now
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
## 2014-10-12 NOTE — Discharge Instructions (Signed)
If you were given medicines take as directed.  If you are on coumadin or contraceptives realize their levels and effectiveness is altered by many different medicines.  If you have any reaction (rash, tongues swelling, other) to the medicines stop taking and see a physician.    If your blood pressure was elevated in the ER make sure you follow up for management with a primary doctor or return for chest pain, shortness of breath or stroke symptoms.  Please follow up as directed and return to the ER or see a physician for new or worsening symptoms.  Thank you. Filed Vitals:   10/12/14 1706 10/12/14 1839  BP: 145/100 165/89  Pulse: 81 83  Temp: 98.3 F (36.8 C)   TempSrc: Oral   Resp: 18 16  Height: 5\' 8"  (1.727 m)   SpO2: 99% 95%

## 2014-10-15 DIAGNOSIS — R350 Frequency of micturition: Secondary | ICD-10-CM | POA: Diagnosis not present

## 2014-10-15 DIAGNOSIS — M549 Dorsalgia, unspecified: Secondary | ICD-10-CM | POA: Diagnosis not present

## 2014-10-15 DIAGNOSIS — R109 Unspecified abdominal pain: Secondary | ICD-10-CM | POA: Diagnosis not present

## 2014-10-23 ENCOUNTER — Ambulatory Visit (INDEPENDENT_AMBULATORY_CARE_PROVIDER_SITE_OTHER): Payer: 59 | Admitting: Internal Medicine

## 2014-10-23 ENCOUNTER — Encounter: Payer: Self-pay | Admitting: Internal Medicine

## 2014-10-23 VITALS — BP 138/90 | HR 91 | Temp 98.8°F | Resp 16

## 2014-10-23 DIAGNOSIS — A09 Infectious gastroenteritis and colitis, unspecified: Secondary | ICD-10-CM | POA: Diagnosis not present

## 2014-10-23 DIAGNOSIS — G8929 Other chronic pain: Secondary | ICD-10-CM

## 2014-10-23 DIAGNOSIS — R1031 Right lower quadrant pain: Secondary | ICD-10-CM | POA: Diagnosis not present

## 2014-10-23 MED ORDER — DICLOFENAC SODIUM 1 % TD GEL: 2.0000 g | Freq: Three times a day (TID) | TRANSDERMAL | Status: DC | PRN

## 2014-10-23 NOTE — Patient Instructions (Signed)
We have sent in voltaren cream which you can rub on your side when it hurts up to 3 times per day. It works on inflammation of the muscles to make you feel better.   Come back for the physical next year or sooner if you need [place].

## 2014-10-23 NOTE — Progress Notes (Signed)
Pre visit review using our clinic review tool, if applicable. No additional management support is needed unless otherwise documented below in the visit note. 

## 2014-10-26 DIAGNOSIS — G8929 Other chronic pain: Secondary | ICD-10-CM | POA: Insufficient documentation

## 2014-10-26 DIAGNOSIS — R1031 Right lower quadrant pain: Secondary | ICD-10-CM

## 2014-10-26 NOTE — Assessment & Plan Note (Signed)
There is some evidence that this is related to cyst or cysts on her ovaries. She is on birth control and following with Ob/Gyn and have advised her to see them. Not classic for gallbladder disease and CT normal 1 year ago on stomach with normal [place] recently. No further imaging needed. NSAIDs for pain and continue to evaluate as needed for change in symptoms or pain level.

## 2014-10-26 NOTE — Progress Notes (Signed)
   Subjective:    Patient ID: Sara Hunt, female    DOB: 09/14/1975, 39 y.o.   MRN: [id number]  HPI The patient is a 39 YO female coming in for ER follow up (in with abdominal pain, US revealed no gallstones, or kidney problems, small ovarian cyst). She is still having mild intermittent pain which is worse around her menstrual cycles. She does follow with Ob/Gyn and they have her on birth control pills as well. She does not have pains associated with eating food (greasy or otherwise). She is trying to make healthy choices for her weight. No nausea or vomiting, denies diarrhea or constipation.   Review of Systems  Constitutional: Negative for fever, chills, activity change, appetite change and fatigue.  HENT: Negative for congestion.   Respiratory: Negative for cough, chest tightness and wheezing.   Cardiovascular: Negative for chest pain, palpitations and leg swelling.  Gastrointestinal: Positive for abdominal pain. Negative for nausea, vomiting, diarrhea, constipation and abdominal distention.  Genitourinary: Negative.   Musculoskeletal: Positive for gait problem.  Neurological: Negative for dizziness, weakness, light-headedness and numbness.      Objective:   Physical Exam  Constitutional: She appears well-developed and well-nourished. No distress.  HENT:  Head: Normocephalic and atraumatic.  Eyes: EOM are normal.  Neck: Normal range of motion. Neck supple.  Cardiovascular: Normal rate and regular rhythm.   Pulmonary/Chest: Effort normal and breath sounds normal. No respiratory distress. She has no wheezes. She has no rales. She exhibits no tenderness.  Abdominal: Soft. Bowel sounds are normal.  Neurological: Coordination abnormal.  Uses wheelchair to ambulate.  Skin: Skin is warm and dry.   Filed Vitals:   10/23/14 1616  BP: 138/90  Pulse: 91  Temp: 98.8 F (37.1 C)  TempSrc: Oral  Resp: 16  SpO2: 98%      Assessment & Plan:

## 2014-10-26 NOTE — Assessment & Plan Note (Signed)
Resolved now and 1 day after our last visit a work outbreak of viral GE happened and she thinks this is what she had.

## 2014-11-08 ENCOUNTER — Encounter (HOSPITAL_COMMUNITY): Payer: Self-pay | Admitting: Emergency Medicine

## 2014-11-08 ENCOUNTER — Emergency Department (HOSPITAL_COMMUNITY): Payer: 59

## 2014-11-08 ENCOUNTER — Emergency Department (HOSPITAL_COMMUNITY)
Admission: EM | Admit: 2014-11-08 | Discharge: 2014-11-08 | Disposition: A | Discharge status: Home / Self Care | Payer: 59 | Attending: Emergency Medicine | Admitting: Emergency Medicine

## 2014-11-08 ENCOUNTER — Emergency Department (INDEPENDENT_AMBULATORY_CARE_PROVIDER_SITE_OTHER)
Admission: EM | Admit: 2014-11-08 | Discharge: 2014-11-08 | Disposition: A | Discharge status: Nursing Home (Includes ICF) | Payer: 59 | Source: Home / Self Care

## 2014-11-08 DIAGNOSIS — Z79899 Other long term (current) drug therapy: Secondary | ICD-10-CM | POA: Insufficient documentation

## 2014-11-08 DIAGNOSIS — R079 Chest pain, unspecified: Secondary | ICD-10-CM | POA: Diagnosis not present

## 2014-11-08 DIAGNOSIS — Z8679 Personal history of other diseases of the circulatory system: Secondary | ICD-10-CM | POA: Diagnosis not present

## 2014-11-08 DIAGNOSIS — Z87891 Personal history of nicotine dependence: Secondary | ICD-10-CM | POA: Diagnosis not present

## 2014-11-08 DIAGNOSIS — Z8619 Personal history of other infectious and parasitic diseases: Secondary | ICD-10-CM | POA: Insufficient documentation

## 2014-11-08 DIAGNOSIS — K219 Gastro-esophageal reflux disease without esophagitis: Secondary | ICD-10-CM | POA: Insufficient documentation

## 2014-11-08 DIAGNOSIS — E785 Hyperlipidemia, unspecified: Secondary | ICD-10-CM | POA: Diagnosis not present

## 2014-11-08 DIAGNOSIS — Z793 Long term (current) use of hormonal contraceptives: Secondary | ICD-10-CM | POA: Insufficient documentation

## 2014-11-08 DIAGNOSIS — M79602 Pain in left arm: Secondary | ICD-10-CM | POA: Diagnosis not present

## 2014-11-08 DIAGNOSIS — J45909 Unspecified asthma, uncomplicated: Secondary | ICD-10-CM | POA: Diagnosis not present

## 2014-11-08 DIAGNOSIS — Z91040 Latex allergy status: Secondary | ICD-10-CM | POA: Insufficient documentation

## 2014-11-08 LAB — CBC
HCT: 36 % (ref 36.0–46.0)
Hemoglobin: 11.8 g/dL — ABNORMAL LOW (ref 12.0–15.0)
MCH: 31 pg (ref 26.0–34.0)
MCHC: 32.8 g/dL (ref 30.0–36.0)
MCV: 94.5 fL (ref 78.0–100.0)
PLATELETS: 257 10*3/uL (ref 150–400)
RBC: 3.81 MIL/uL — AB (ref 3.87–5.11)
RDW: 12.2 % (ref 11.5–15.5)
WBC: 6.8 10*3/uL (ref 4.0–10.5)

## 2014-11-08 LAB — BASIC METABOLIC PANEL WITH GFR
Anion gap: 8 (ref 5–15)
BUN: 10 mg/dL (ref 6–20)
CO2: 27 mmol/L (ref 22–32)
Calcium: 9.2 mg/dL (ref 8.9–10.3)
Chloride: 102 mmol/L (ref 101–111)
Creatinine, Ser: 0.77 mg/dL (ref 0.44–1.00)
GFR calc Af Amer: >60 mL/min
GFR calc non Af Amer: >60 mL/min
Glucose, Bld: 86 mg/dL (ref 65–99)
Potassium: 3.9 mmol/L (ref 3.5–5.1)
Sodium: 137 mmol/L (ref 135–145)

## 2014-11-08 LAB — I-STAT TROPONIN, ED: Troponin i, poc: 0 ng/mL (ref 0.00–0.08)

## 2014-11-08 MED ORDER — SUCRALFATE 1 G PO TABS: 1.0000 g | ORAL_TABLET | Freq: Three times a day (TID) | ORAL | Status: DC
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
## 2014-11-08 NOTE — ED Notes (Addendum)
Pt being transferred to ED via shuttle for further workup for chest burning.  Report was called to the First RN in the ED.

## 2014-11-08 NOTE — Discharge Instructions (Signed)
Return here as needed.  Follow-up with your primary care doctor °

## 2014-11-08 NOTE — ED Notes (Addendum)
Pt reports a burning pain in her mid chest that radiates down to her mid abdomen and down her left arm.  She reports these episodes related to stressful situations, she has up to two episodes a day for the last week and she states she gets a headache after.  She denies any SOB or heart racing during the episodes but notes palpitations at night.  Pt has a history of SVT s/p ablation in January of this year.
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
## 2014-12-17 ENCOUNTER — Other Ambulatory Visit: Payer: Self-pay | Admitting: Internal Medicine

## 2014-12-17 ENCOUNTER — Other Ambulatory Visit: Payer: Self-pay | Admitting: Obstetrics and Gynecology

## 2014-12-17 DIAGNOSIS — D259 Leiomyoma of uterus, unspecified: Secondary | ICD-10-CM

## 2015-01-15 ENCOUNTER — Other Ambulatory Visit: Payer: 59

## 2015-02-06 ENCOUNTER — Other Ambulatory Visit: Payer: 59

## 2015-03-06 ENCOUNTER — Ambulatory Visit
Admission: RE | Admit: 2015-03-06 | Discharge: 2015-03-06 | Disposition: A | Discharge status: Home / Self Care | Payer: 59 | Source: Ambulatory Visit | Attending: Internal Medicine | Admitting: Internal Medicine

## 2015-03-06 DIAGNOSIS — D259 Leiomyoma of uterus, unspecified: Secondary | ICD-10-CM
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
## 2015-03-17 ENCOUNTER — Encounter (HOSPITAL_COMMUNITY): Payer: Self-pay | Admitting: *Deleted

## 2015-03-17 ENCOUNTER — Emergency Department (HOSPITAL_COMMUNITY)
Admission: EM | Admit: 2015-03-17 | Discharge: 2015-03-17 | Disposition: A | Discharge status: Nursing Home (Includes ICF) | Payer: 59 | Source: Home / Self Care

## 2015-03-17 ENCOUNTER — Encounter (HOSPITAL_COMMUNITY): Payer: Self-pay | Admitting: Emergency Medicine

## 2015-03-17 ENCOUNTER — Emergency Department (HOSPITAL_COMMUNITY)
Admission: EM | Admit: 2015-03-17 | Discharge: 2015-03-17 | Disposition: A | Discharge status: Home / Self Care | Payer: 59 | Attending: Emergency Medicine | Admitting: Emergency Medicine

## 2015-03-17 DIAGNOSIS — Y9389 Activity, other specified: Secondary | ICD-10-CM | POA: Diagnosis not present

## 2015-03-17 DIAGNOSIS — X58XXXA Exposure to other specified factors, initial encounter: Secondary | ICD-10-CM | POA: Diagnosis not present

## 2015-03-17 DIAGNOSIS — T782XXA Anaphylactic shock, unspecified, initial encounter: Secondary | ICD-10-CM

## 2015-03-17 DIAGNOSIS — R0602 Shortness of breath: Secondary | ICD-10-CM

## 2015-03-17 DIAGNOSIS — Y998 Other external cause status: Secondary | ICD-10-CM | POA: Diagnosis not present

## 2015-03-17 DIAGNOSIS — Y9289 Other specified places as the place of occurrence of the external cause: Secondary | ICD-10-CM | POA: Diagnosis not present

## 2015-03-17 DIAGNOSIS — T7840XA Allergy, unspecified, initial encounter: Secondary | ICD-10-CM | POA: Diagnosis present

## 2015-03-17 MED ORDER — EPINEPHRINE HCL 1 MG/ML IJ SOLN
0.3000 mg | Freq: Once | INTRAMUSCULAR | Status: AC
  Administered 2015-03-17: 0.3 mg via INTRAMUSCULAR

## 2015-03-17 MED ORDER — METHYLPREDNISOLONE SODIUM SUCC 125 MG IJ SOLR
INTRAMUSCULAR | Status: AC
Start: 2015-03-17 — End: 2015-03-17
  Filled 2015-03-17: qty 2

## 2015-03-17 MED ORDER — METHYLPREDNISOLONE SODIUM SUCC 125 MG IJ SOLR
125.0000 mg | Freq: Once | INTRAMUSCULAR | Status: AC
  Administered 2015-03-17: 125 mg via INTRAMUSCULAR

## 2015-03-17 MED ORDER — DIPHENHYDRAMINE HCL 50 MG/ML IJ SOLN
INTRAMUSCULAR | Status: AC
  Filled 2015-03-17: qty 1

## 2015-03-17 MED ORDER — DIPHENHYDRAMINE HCL 50 MG/ML IJ SOLN
50.0000 mg | Freq: Once | INTRAMUSCULAR | Status: AC
  Administered 2015-03-17: 50 mg via INTRAMUSCULAR

## 2015-03-17 MED ORDER — EPINEPHRINE HCL 1 MG/ML IJ SOLN
INTRAMUSCULAR | Status: AC
  Filled 2015-03-17: qty 1

## 2015-03-17 NOTE — ED Notes (Signed)
Pt came to nurse first to advise she is leaving ED, she will call her PCP in am.  Advised to return to ED if symptoms worsens or new symptoms develop.
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
## 2015-03-17 NOTE — ED Notes (Signed)
Pt states throat feeling a little better.

## 2015-03-17 NOTE — ED Notes (Signed)
Reports eating fish approx 2 hrs ago - started with some pruritis and feeling of lump in throat.  Has not taken any meds.

## 2015-03-17 NOTE — ED Notes (Signed)
Pt states she ate shrimp around 4pm.  1-2 hours later she felt sob, lump in throat, and swelling to R side of face.  Pt seen at [facility] and sent to ED after Epi, Solumedrol, and Benadryl.  Denies sob at present.

## 2015-03-17 NOTE — ED Notes (Signed)
Report called to Santiago Glad, ED First Nurse.

## 2015-03-18 ENCOUNTER — Telehealth: Payer: Self-pay | Admitting: Internal Medicine

## 2015-03-18 DIAGNOSIS — T7840XS Allergy, unspecified, sequela: Secondary | ICD-10-CM

## 2015-03-18 NOTE — Telephone Encounter (Signed)
Placed referral for allergy. Will listen next time she is in. Have her monitor BP at home.

## 2015-03-18 NOTE — Telephone Encounter (Signed)
Patient was in the ED last night for an allergic reaction of unknown origin. They suspect it to be allergies. She is asking for a referral to get an allergy test.   She had elevated bp, but they said that they detected a murmur (but could not verify if it is due to the high bp)

## 2015-03-18 NOTE — Telephone Encounter (Signed)
Patient will monitor bp at home and will call us if its high. She will also call and schedule an appt for her physical and to have her heart listened to for the murmur.
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
## 2015-03-27 ENCOUNTER — Ambulatory Visit: Payer: Self-pay | Admitting: Allergy and Immunology

## 2015-04-02 ENCOUNTER — Telehealth: Payer: 59 | Admitting: Family

## 2015-04-02 DIAGNOSIS — J0100 Acute maxillary sinusitis, unspecified: Secondary | ICD-10-CM

## 2015-04-02 MED ORDER — AMOXICILLIN-POT CLAVULANATE 875-125 MG PO TABS: 1.0000 | ORAL_TABLET | Freq: Two times a day (BID) | ORAL | Status: DC

## 2015-04-02 NOTE — Progress Notes (Signed)

## 2015-04-04 ENCOUNTER — Telehealth: Payer: Self-pay | Admitting: Internal Medicine

## 2015-04-04 NOTE — Telephone Encounter (Signed)
Patient Name: Sara Hunt  DOB: [birth year]    Initial Comment Caller states has a sinus infection, taking meds, heart rate elevated a bit, very hot. Sometimes reception not good in house   Nurse Assessment  Nurse: Raphael Gibney, RN, Vanita Ingles Date/Time Eilene Ghazi Time): 04/04/2015 10:47:06 AM  Confirm and document reason for call. If symptomatic, describe symptoms. You must click the next button to save text entered. ---Caller states she is taking antibiotics for a sinus infection for 2 days. She is having hot flashes. She is waking up and burning up. No fever. BP ok. She is taking amoxicillin clav. she is still congested but it is improving.  Has the patient traveled out of the country within the last 30 days? ---No  Does the patient have any new or worsening symptoms? ---Yes  Will a triage be completed? ---Yes  Related visit to physician within the last 2 weeks? ---Yes  Does the PT have any chronic conditions? (i.e. diabetes, asthma, etc.) ---No  Is the patient pregnant or possibly pregnant? (Ask all females between the ages of 68-55) ---No  Is this a behavioral health or substance abuse call? ---No     Guidelines    Guideline Title Affirmed Question Affirmed Notes  Sinus Infection on Antibiotic Follow-up Call [1] Taking antibiotic AND [2] nose still blocked (all triage questions negative)    Final Disposition User   Wilson, RN, Vanita Ingles    Comments  Pt states she has an urgent care that is available to her at work and she will go there today regarding her hot flashes.  Advised caller as per [email or web address] that hot flashes are not a side effect of amoxil Clav  Verbalized understanding of instructions   Disagree/Comply: Comply

## 2015-04-09 ENCOUNTER — Ambulatory Visit: Payer: Self-pay | Admitting: Allergy and Immunology

## 2015-05-29 ENCOUNTER — Ambulatory Visit: Payer: 59 | Admitting: Internal Medicine

## 2015-08-26 ENCOUNTER — Other Ambulatory Visit: Payer: Self-pay | Admitting: Internal Medicine

## 2015-08-26 DIAGNOSIS — E669 Obesity, unspecified: Secondary | ICD-10-CM

## 2015-09-08 ENCOUNTER — Other Ambulatory Visit: Payer: Self-pay | Admitting: Internal Medicine

## 2015-10-07 ENCOUNTER — Telehealth: Payer: Self-pay | Admitting: Emergency Medicine

## 2015-10-07 NOTE — Telephone Encounter (Signed)
Pt called and said the frame broke on her wheelchair. She cant go anywhere now that its broken. Can she can a prescription for a new wheelchair? Please advise thanks.

## 2015-10-07 NOTE — Telephone Encounter (Signed)
[facility] is who she uses. She wants to keep the same kind of chair she is using now. She is going to call [facility] and find out which kind that is. She will call back and follow up with [place]. Also she is was pick it up if we can not fax it over.

## 2015-10-07 NOTE — Telephone Encounter (Signed)
Rx given, needs to be faxed today.

## 2015-10-07 NOTE — Telephone Encounter (Signed)
cruiser 3 light weight is the model   Fax number [phone]

## 2015-10-07 NOTE — Telephone Encounter (Signed)
This is incorrect fax number, I have called [facility] and faxed to [phone]---document is on amy's desk

## 2015-10-07 NOTE — Telephone Encounter (Signed)
Yes, does she want a particular model? She can pick up the rx or we can mail it to her.

## 2015-10-10 ENCOUNTER — Telehealth: Payer: Self-pay | Admitting: Internal Medicine

## 2015-10-10 NOTE — Telephone Encounter (Signed)
Will be faxing over updates script for wheelchair.  Needs signed and completed and sent back to [phone]

## 2015-10-10 NOTE — Telephone Encounter (Signed)
[facility]axed to [facility]  

## 2015-11-02 ENCOUNTER — Encounter (HOSPITAL_COMMUNITY): Payer: Self-pay | Admitting: Emergency Medicine

## 2015-11-02 ENCOUNTER — Emergency Department (HOSPITAL_COMMUNITY)
Admission: EM | Admit: 2015-11-02 | Discharge: 2015-11-02 | Disposition: A | Discharge status: Home / Self Care | Payer: 59 | Attending: Emergency Medicine | Admitting: Emergency Medicine

## 2015-11-02 DIAGNOSIS — R2243 Localized swelling, mass and lump, lower limb, bilateral: Secondary | ICD-10-CM | POA: Insufficient documentation

## 2015-11-02 DIAGNOSIS — Z993 Dependence on wheelchair: Secondary | ICD-10-CM | POA: Insufficient documentation

## 2015-11-02 DIAGNOSIS — M79604 Pain in right leg: Secondary | ICD-10-CM | POA: Diagnosis present

## 2015-11-02 DIAGNOSIS — J45909 Unspecified asthma, uncomplicated: Secondary | ICD-10-CM | POA: Diagnosis not present

## 2015-11-02 DIAGNOSIS — L819 Disorder of pigmentation, unspecified: Secondary | ICD-10-CM

## 2015-11-02 DIAGNOSIS — Z87891 Personal history of nicotine dependence: Secondary | ICD-10-CM | POA: Diagnosis not present

## 2015-11-02 DIAGNOSIS — Z91040 Latex allergy status: Secondary | ICD-10-CM | POA: Diagnosis not present

## 2015-11-02 DIAGNOSIS — R600 Localized edema: Secondary | ICD-10-CM
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
## 2015-11-02 NOTE — ED Triage Notes (Signed)
Pt. In a wheel chair and said when she looked in the mirror last night she had some bruising, and not sure if its because she is hitting her wheel chair. Concerned its a blood clot.

## 2015-11-06 ENCOUNTER — Encounter: Payer: 59 | Admitting: Internal Medicine

## 2015-11-06 DIAGNOSIS — Z0289 Encounter for other administrative examinations: Secondary | ICD-10-CM

## 2016-01-15 IMAGING — CR DG ELBOW COMPLETE 3+V*R*
4 series · 4 of 4 positions shown · non-contrast
Comparison: None.

CLINICAL DATA: Hip elbow on wall 2 weeks ago. Lateral posterior
elbow pain.

EXAM:
RIGHT ELBOW - COMPLETE 3+ VIEW

[view not recorded (1 of 4)]
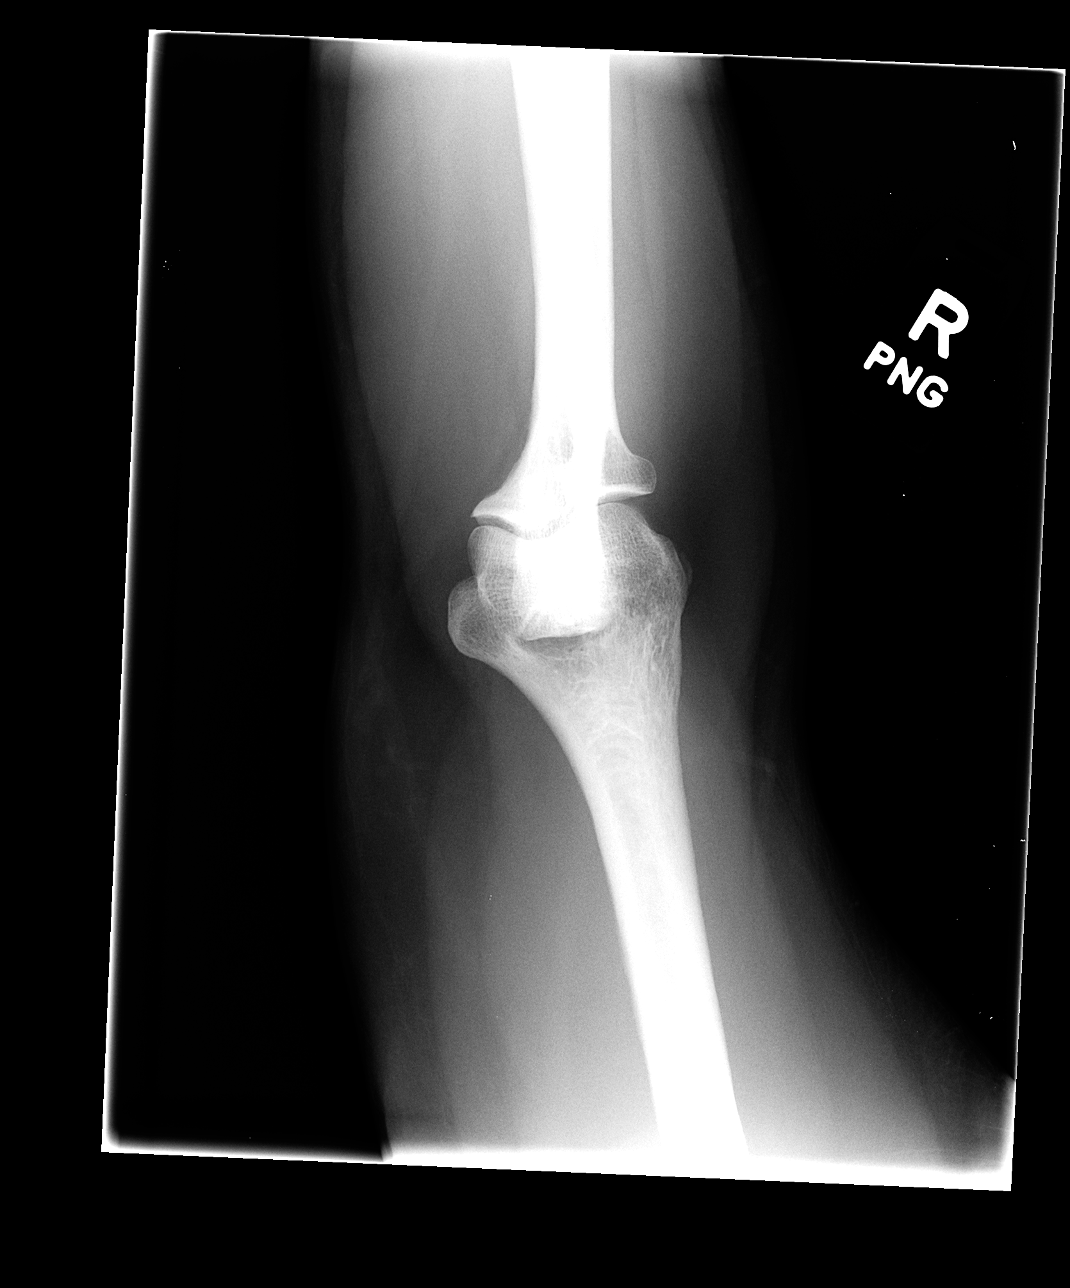

[view not recorded (2 of 4)]
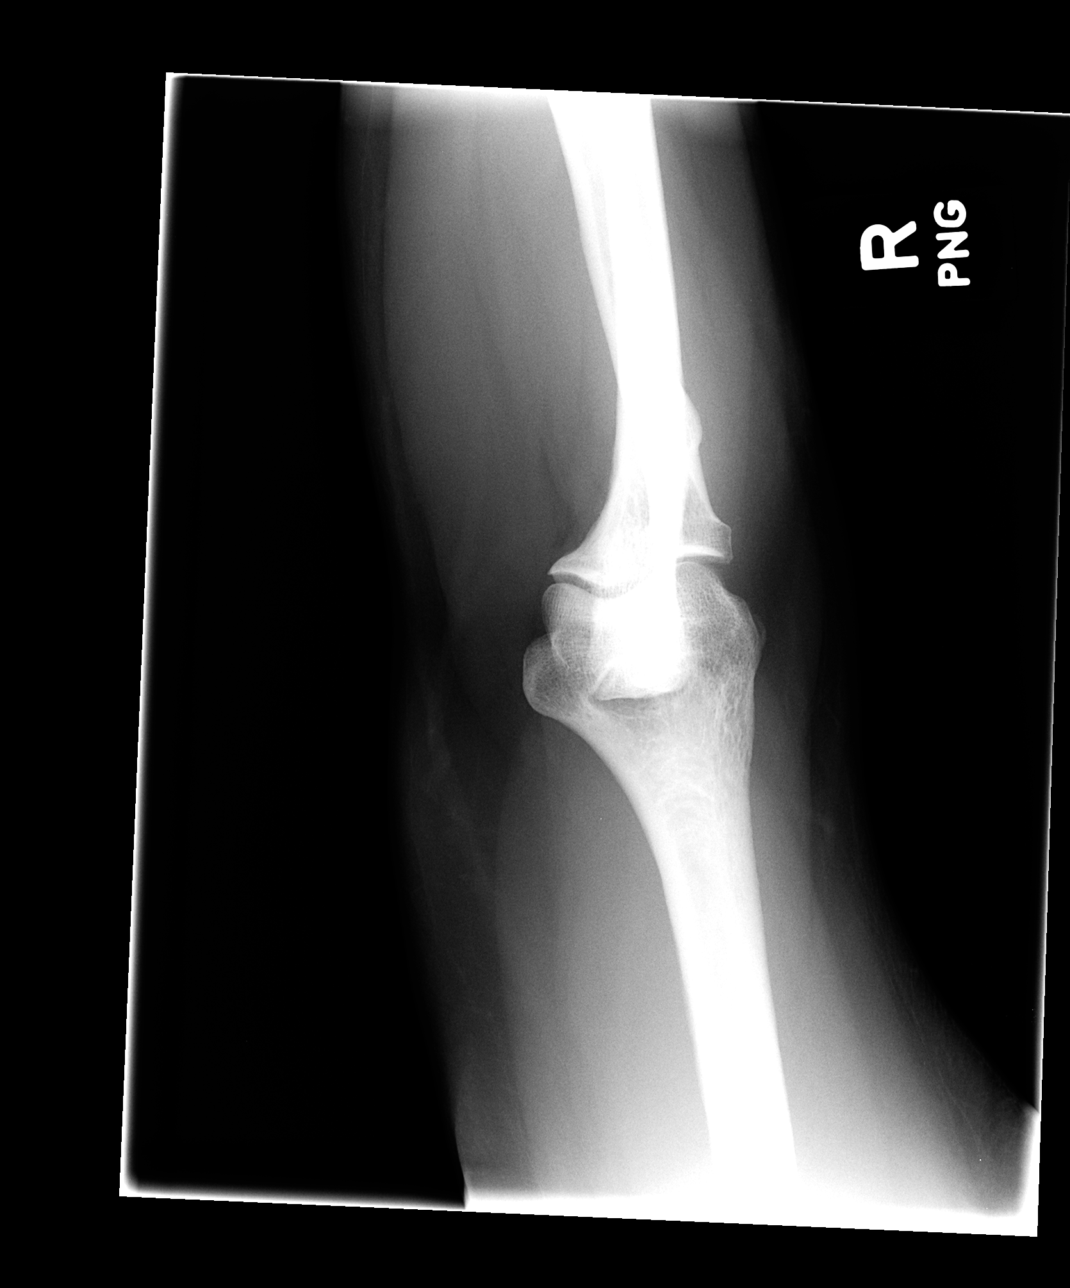

[view not recorded (3 of 4)]
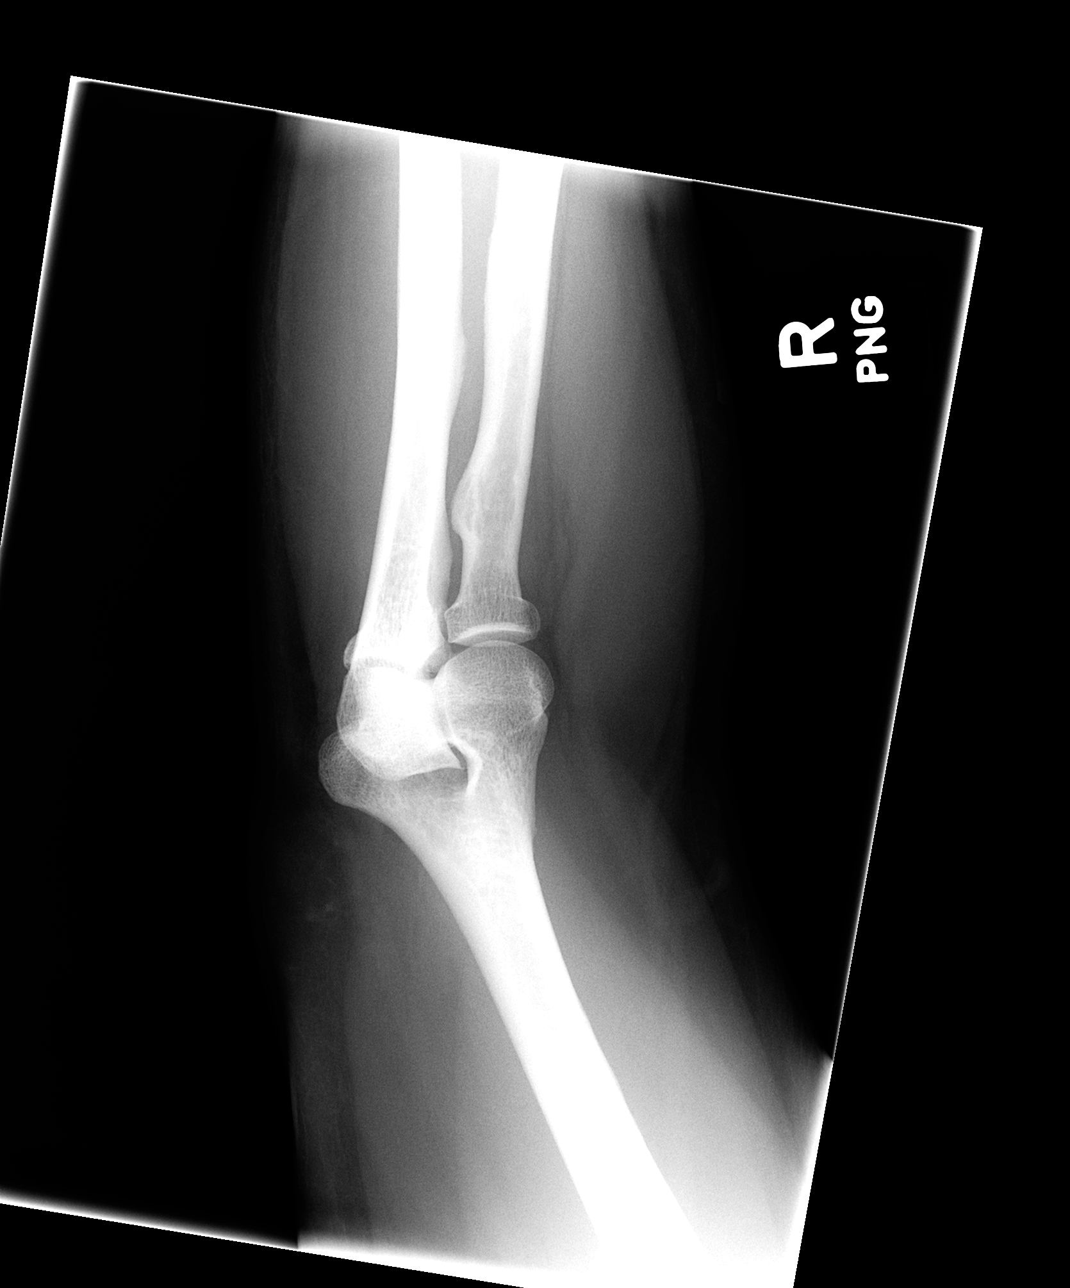

[view not recorded (4 of 4)]
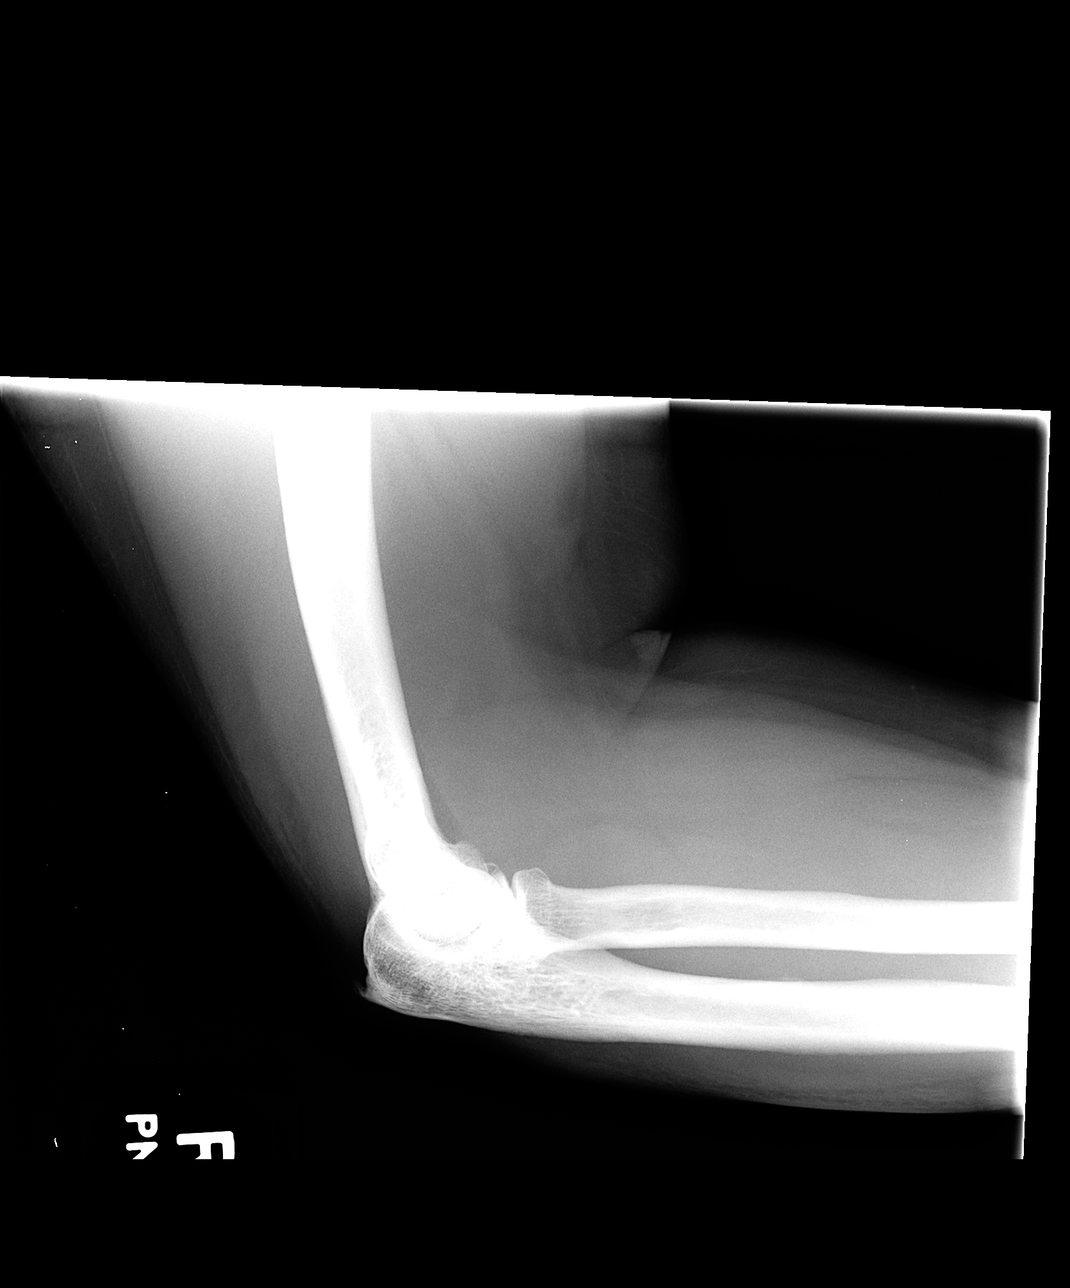

[4 of 4 positions shown; findings below may reference images not displayed]

FINDINGS: There is no evidence of fracture, dislocation, or joint effusion.
There is no evidence of arthropathy or other focal bone abnormality.
Soft tissues are unremarkable.
IMPRESSION: Negative.

## 2016-02-06 ENCOUNTER — Ambulatory Visit (INDEPENDENT_AMBULATORY_CARE_PROVIDER_SITE_OTHER): Payer: 59 | Admitting: Internal Medicine

## 2016-02-06 ENCOUNTER — Encounter: Payer: Self-pay | Admitting: Internal Medicine

## 2016-02-06 ENCOUNTER — Other Ambulatory Visit (INDEPENDENT_AMBULATORY_CARE_PROVIDER_SITE_OTHER): Payer: 59

## 2016-02-06 VITALS — BP 150/90 | HR 79 | Temp 98.3°F | Resp 14 | Ht 68.0 in | Wt 245.0 lb

## 2016-02-06 DIAGNOSIS — E785 Hyperlipidemia, unspecified: Secondary | ICD-10-CM

## 2016-02-06 DIAGNOSIS — E669 Obesity, unspecified: Secondary | ICD-10-CM

## 2016-02-06 DIAGNOSIS — Z0000 Encounter for general adult medical examination without abnormal findings: Secondary | ICD-10-CM | POA: Diagnosis not present

## 2016-02-06 LAB — LIPID PANEL
CHOL/HDL RATIO: 8
Cholesterol: 256 mg/dL — ABNORMAL HIGH (ref 0–200)
HDL: 34 mg/dL — ABNORMAL LOW (ref >39.00)
LDL CALC: 209 mg/dL — AB (ref 0–99)
NONHDL: 222.18
TRIGLYCERIDES: 68 mg/dL (ref 0.0–149.0)
VLDL: 13.6 mg/dL (ref 0.0–40.0)

## 2016-02-06 LAB — COMPREHENSIVE METABOLIC PANEL
ALT: 11 U/L (ref 0–35)
AST: 4 U/L (ref 0–37)
Albumin: 3.5 g/dL (ref 3.5–5.2)
Alkaline Phosphatase: 57 U/L (ref 39–117)
BUN: 14 mg/dL (ref 6–23)
CALCIUM: 9 mg/dL (ref 8.4–10.5)
CHLORIDE: 106 meq/L (ref 96–112)
CO2: 25 meq/L (ref 19–32)
CREATININE: 0.77 mg/dL (ref 0.40–1.20)
GFR: 106.85 mL/min (ref >60.00)
Glucose, Bld: 91 mg/dL (ref 70–99)
Potassium: 4 mEq/L (ref 3.5–5.1)
Sodium: 137 mEq/L (ref 135–145)
Total Bilirubin: 0.4 mg/dL (ref 0.2–1.2)
Total Protein: 7.1 g/dL (ref 6.0–8.3)

## 2016-02-06 LAB — HEMOGLOBIN A1C: Hgb A1c MFr Bld: 4.8 % (ref 4.6–6.5)

## 2016-02-06 LAB — CBC
HEMATOCRIT: 36.2 % (ref 36.0–46.0)
Hemoglobin: 12.1 g/dL (ref 12.0–15.0)
MCHC: 33.6 g/dL (ref 30.0–36.0)
MCV: 94.8 fl (ref 78.0–100.0)
Platelets: 237 10*3/uL (ref 150.0–400.0)
RBC: 3.82 Mil/uL — AB (ref 3.87–5.11)
RDW: 12.8 % (ref 11.5–15.5)
WBC: 6.6 10*3/uL (ref 4.0–10.5)

## 2016-02-06 LAB — HIV ANTIBODY (ROUTINE TESTING W REFLEX): HIV: NONREACTIVE

## 2016-02-06 MED ORDER — ROSUVASTATIN CALCIUM 10 MG PO TABS: 10.0000 mg | ORAL_TABLET | Freq: Every day | ORAL | 3 refills | Status: DC

## 2016-02-06 NOTE — Progress Notes (Signed)
   Subjective:    Patient ID: Sara Hunt, female    DOB: 1975-09-30, 41 y.o.   MRN: [id number]  HPI The patient is a 41 YO female coming in for wellness. No new concerns.   PMH, [facility], social history reviewed and updated.   Review of Systems  Constitutional: Negative.   HENT: Negative.   Eyes: Negative.   Respiratory: Negative for cough, chest tightness and shortness of breath.   Cardiovascular: Negative for chest pain, palpitations and leg swelling.  Gastrointestinal: Negative for abdominal distention, abdominal pain, constipation, diarrhea, nausea and vomiting.  Musculoskeletal: Negative.   Skin: Negative.   Neurological: Negative.   Psychiatric/Behavioral: Negative.       Objective:   Physical Exam  Constitutional: She is oriented to person, place, and time. She appears well-developed and well-nourished.  HENT:  Head: Normocephalic and atraumatic.  Eyes: EOM are normal.  Neck: Normal range of motion.  Cardiovascular: Normal rate and regular rhythm.   Pulmonary/Chest: Effort normal and breath sounds normal. No respiratory distress. She has no wheezes. She has no rales.  Abdominal: Soft. Bowel sounds are normal. She exhibits no distension. There is no tenderness. There is no rebound.  Musculoskeletal: She exhibits no edema.  Neurological: She is alert and oriented to person, place, and time. Coordination abnormal.  In wheelchair  Skin: Skin is warm and dry.  Psychiatric: She has a normal mood and affect.   Vitals:   02/06/16 1004  BP: (!) 150/90  Pulse: 79  Resp: 14  Temp: 98.3 F (36.8 C)  TempSrc: Oral  SpO2: 99%  Weight: 245 lb (111.1 kg)  Height: 5\' 8"  (1.727 m)      Assessment & Plan:

## 2016-02-06 NOTE — Patient Instructions (Signed)
We have sent in the refills and will check the labs today.  Health Maintenance, Female Introduction Adopting a healthy lifestyle and getting preventive care can go a long way to promote health and wellness. Talk with your health care provider about what schedule of regular examinations is right for you. This is a good chance for you to check in with your provider about disease prevention and staying healthy. In between checkups, there are plenty of things you can do on your own. Experts have done a lot of research about which lifestyle changes and preventive measures are most likely to keep you healthy. Ask your health care provider for more information. Weight and diet Eat a healthy diet  Be sure to include plenty of vegetables, fruits, low-fat dairy products, and lean protein.  Do not eat a lot of foods high in solid fats, added sugars, or salt.  Get regular exercise. This is one of the most important things you can do for your health.  Most adults should exercise for at least 150 minutes each week. The exercise should increase your heart rate and make you sweat (moderate-intensity exercise).  Most adults should also do strengthening exercises at least twice a week. This is in addition to the moderate-intensity exercise. Maintain a healthy weight  Body mass index (BMI) is a measurement that can be used to identify possible weight problems. It estimates body fat based on height and weight. Your health care provider can help determine your BMI and help you achieve or maintain a healthy weight.  For females 34 years of age and older:  A BMI below 18.5 is considered underweight.  A BMI of 18.5 to 24.9 is normal.  A BMI of 25 to 29.9 is considered overweight.  A BMI of 30 and above is considered obese. Watch levels of cholesterol and blood lipids  You should start having your blood tested for lipids and cholesterol at 40 years of age, then have this test every 5 years.  You may need  to have your cholesterol levels checked more often if:  Your lipid or cholesterol levels are high.  You are older than 40 years of age.  You are at high risk for heart disease. Cancer screening Lung Cancer  Lung cancer screening is recommended for adults 63-63 years old who are at high risk for lung cancer because of a history of smoking.  A yearly low-dose CT scan of the lungs is recommended for people who:  Currently smoke.  Have quit within the past 15 years.  Have at least a 30-pack-year history of smoking. A pack year is smoking an average of one pack of cigarettes a day for 1 year.  Yearly screening should continue until it has been 15 years since you quit.  Yearly screening should stop if you develop a health problem that would prevent you from having lung cancer treatment. Breast Cancer  Practice breast self-awareness. This means understanding how your breasts normally appear and feel.  It also means doing regular breast self-exams. Let your health care provider know about any changes, no matter how small.  If you are in your 20s or 30s, you should have a clinical breast exam (CBE) by a health care provider every 1-3 years as part of a regular health exam.  If you are 83 or older, have a CBE every year. Also consider having a breast X-ray (mammogram) every year.  If you have a family history of breast cancer, talk to your health care provider  about genetic screening.  If you are at high risk for breast cancer, talk to your health care provider about having an MRI and a mammogram every year.  Breast cancer gene (BRCA) assessment is recommended for women who have family members with BRCA-related cancers. BRCA-related cancers include:  Breast.  Ovarian.  Tubal.  Peritoneal cancers.  Results of the assessment will determine the need for genetic counseling and BRCA1 and BRCA2 testing. Cervical Cancer  Your health care provider may recommend that you be screened  regularly for cancer of the pelvic organs (ovaries, uterus, and vagina). This screening involves a pelvic examination, including checking for microscopic changes to the surface of your cervix (Pap test). You may be encouraged to have this screening done every 3 years, beginning at age 78.  For women ages 50-65, health care providers may recommend pelvic exams and Pap testing every 3 years, or they may recommend the Pap and pelvic exam, combined with testing for human papilloma virus (HPV), every 5 years. Some types of HPV increase your risk of cervical cancer. Testing for HPV may also be done on women of any age with unclear Pap test results.  Other health care providers may not recommend any screening for nonpregnant women who are considered low risk for pelvic cancer and who do not have symptoms. Ask your health care provider if a screening pelvic exam is right for you.  If you have had past treatment for cervical cancer or a condition that could lead to cancer, you need Pap tests and screening for cancer for at least 20 years after your treatment. If Pap tests have been discontinued, your risk factors (such as having a new sexual partner) need to be reassessed to determine if screening should resume. Some women have medical problems that increase the chance of getting cervical cancer. In these cases, your health care provider may recommend more frequent screening and Pap tests. Colorectal Cancer  This type of cancer can be detected and often prevented.  Routine colorectal cancer screening usually begins at 40 years of age and continues through 40 years of age.  Your health care provider may recommend screening at an earlier age if you have risk factors for colon cancer.  Your health care provider may also recommend using home test kits to check for hidden blood in the stool.  A small camera at the end of a tube can be used to examine your colon directly (sigmoidoscopy or colonoscopy). This is  done to check for the earliest forms of colorectal cancer.  Routine screening usually begins at age 68.  Direct examination of the colon should be repeated every 5-10 years through 40 years of age. However, you may need to be screened more often if early forms of precancerous polyps or small growths are found. Skin Cancer  Check your skin from head to toe regularly.  Tell your health care provider about any new moles or changes in moles, especially if there is a change in a mole's shape or color.  Also tell your health care provider if you have a mole that is larger than the size of a pencil eraser.  Always use sunscreen. Apply sunscreen liberally and repeatedly throughout the day.  Protect yourself by wearing long sleeves, pants, a wide-brimmed hat, and sunglasses whenever you are outside. Heart disease, diabetes, and high blood pressure  High blood pressure causes heart disease and increases the risk of stroke. High blood pressure is more likely to develop in:  People who have  blood pressure in the high end of the normal range (130-139/85-89 mm Hg).  People who are overweight or obese.  People who are African American.  If you are 29-72 years of age, have your blood pressure checked every 3-5 years. If you are 88 years of age or older, have your blood pressure checked every year. You should have your blood pressure measured twice-once when you are at a hospital or clinic, and once when you are not at a hospital or clinic. Record the average of the two measurements. To check your blood pressure when you are not at a hospital or clinic, you can use:  An automated blood pressure machine at a pharmacy.  A home blood pressure monitor.  If you are between 55 years and 45 years old, ask your health care provider if you should take aspirin to prevent strokes.  Have regular diabetes screenings. This involves taking a blood sample to check your fasting blood sugar level.  If you are at a  normal weight and have a low risk for diabetes, have this test once every three years after 39 years of age.  If you are overweight and have a high risk for diabetes, consider being tested at a younger age or more often. Preventing infection Hepatitis B  If you have a higher risk for hepatitis B, you should be screened for this virus. You are considered at high risk for hepatitis B if:  You were born in a country where hepatitis B is common. Ask your health care provider which countries are considered high risk.  Your parents were born in a high-risk country, and you have not been immunized against hepatitis B (hepatitis B vaccine).  You have HIV or AIDS.  You use needles to inject street drugs.  You live with someone who has hepatitis B.  You have had sex with someone who has hepatitis B.  You get hemodialysis treatment.  You take certain medicines for conditions, including cancer, organ transplantation, and autoimmune conditions. Hepatitis C  Blood testing is recommended for:  Everyone born from [birth year] through 1964.  Anyone with known risk factors for hepatitis C. Sexually transmitted infections (STIs)  You should be screened for sexually transmitted infections (STIs) including gonorrhea and chlamydia if:  You are sexually active and are younger than 40 years of age.  You are older than 40 years of age and your health care provider tells you that you are at risk for this type of infection.  Your sexual activity has changed since you were last screened and you are at an increased risk for chlamydia or gonorrhea. Ask your health care provider if you are at risk.  If you do not have HIV, but are at risk, it may be recommended that you take a prescription medicine daily to prevent HIV infection. This is called pre-exposure prophylaxis (PrEP). You are considered at risk if:  You are sexually active and do not regularly use condoms or know the HIV status of your partner(s).  You  take drugs by injection.  You are sexually active with a partner who has HIV. Talk with your health care provider about whether you are at high risk of being infected with HIV. If you choose to begin PrEP, you should first be tested for HIV. You should then be tested every 3 months for as long as you are taking PrEP. Pregnancy  If you are premenopausal and you may become pregnant, ask your health care provider about preconception counseling.  If you may become pregnant, take 400 to 800 micrograms (mcg) of folic acid every day.  If you want to prevent pregnancy, talk to your health care provider about birth control (contraception). Osteoporosis and menopause  Osteoporosis is a disease in which the bones lose minerals and strength with aging. This can result in serious bone fractures. Your risk for osteoporosis can be identified using a bone density scan.  If you are 50 years of age or older, or if you are at risk for osteoporosis and fractures, ask your health care provider if you should be screened.  Ask your health care provider whether you should take a calcium or vitamin D supplement to lower your risk for osteoporosis.  Menopause may have certain physical symptoms and risks.  Hormone replacement therapy may reduce some of these symptoms and risks. Talk to your health care provider about whether hormone replacement therapy is right for you. Follow these instructions at home:  Schedule regular health, dental, and eye exams.  Stay current with your immunizations.  Do not use any tobacco products including cigarettes, chewing tobacco, or electronic cigarettes.  If you are pregnant, do not drink alcohol.  If you are breastfeeding, limit how much and how often you drink alcohol.  Limit alcohol intake to no more than 1 drink per day for nonpregnant women. One drink equals 12 ounces of beer, 5 ounces of wine, or 1 ounces of hard liquor.  Do not use street drugs.  Do not share  needles.  Ask your health care provider for help if you need support or information about quitting drugs.  Tell your health care provider if you often feel depressed.  Tell your health care provider if you have ever been abused or do not feel safe at home. This information is not intended to replace advice given to you by your health care provider. Make sure you discuss any questions you have with your health care provider. Document Released: 07/07/2010 Document Revised: 05/30/2015 Document Reviewed: 09/25/2014  2017 Elsevier

## 2016-02-06 NOTE — Assessment & Plan Note (Signed)
Talked to her about weight reduction to help with her borderline blood pressure.

## 2016-02-06 NOTE — Assessment & Plan Note (Signed)
Declines flu shot, tetanus up to date. Counseled about exercise and diet to help with weight. Counseled about dangers of distracted driving. Given screening recommendations. Pap up to date with gyn.

## 2016-02-06 NOTE — Assessment & Plan Note (Signed)
Out of crestor 10 mg daily right now, needs lipid panel today. Adjust as needed.

## 2016-02-06 NOTE — Progress Notes (Signed)
Pre visit review using our clinic review tool, if applicable. No additional management support is needed unless otherwise documented below in the visit note. 

## 2016-03-26 DIAGNOSIS — N760 Acute vaginitis: Secondary | ICD-10-CM | POA: Diagnosis not present

## 2016-04-13 ENCOUNTER — Ambulatory Visit (INDEPENDENT_AMBULATORY_CARE_PROVIDER_SITE_OTHER): Payer: 59

## 2016-04-13 ENCOUNTER — Ambulatory Visit (INDEPENDENT_AMBULATORY_CARE_PROVIDER_SITE_OTHER): Payer: 59 | Admitting: Sports Medicine

## 2016-04-13 ENCOUNTER — Encounter: Payer: Self-pay | Admitting: Sports Medicine

## 2016-04-13 VITALS — BP 150/90 | HR 80 | Ht 68.0 in

## 2016-04-13 DIAGNOSIS — S99912A Unspecified injury of left ankle, initial encounter: Secondary | ICD-10-CM | POA: Diagnosis not present

## 2016-04-13 DIAGNOSIS — K219 Gastro-esophageal reflux disease without esophagitis: Secondary | ICD-10-CM

## 2016-04-13 DIAGNOSIS — R2689 Other abnormalities of gait and mobility: Secondary | ICD-10-CM | POA: Diagnosis not present

## 2016-04-13 DIAGNOSIS — M25572 Pain in left ankle and joints of left foot: Secondary | ICD-10-CM | POA: Diagnosis not present

## 2016-04-13 DIAGNOSIS — S93432A Sprain of tibiofibular ligament of left ankle, initial encounter: Secondary | ICD-10-CM

## 2016-04-13 DIAGNOSIS — M7989 Other specified soft tissue disorders: Secondary | ICD-10-CM | POA: Diagnosis not present

## 2016-04-13 MED ORDER — IBUPROFEN 800 MG PO TABS
800.0000 mg | ORAL_TABLET | Freq: Three times a day (TID) | ORAL | 1 refills | Status: DC | PRN
Start: 2016-04-13 — End: 2017-05-24

## 2016-04-13 MED ORDER — FAMOTIDINE 20 MG PO TABS: 20.0000 mg | ORAL_TABLET | Freq: Two times a day (BID) | ORAL | 1 refills | Status: DC
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
## 2016-04-13 NOTE — Assessment & Plan Note (Signed)
High ankle sprain with a likely ankle effusion.  Ligamentously intact on exam.  Anti-inflammatories and immobilization.    Follow-up in 2 weeks for progressive exercises and MSK [place] vs re-imaging

## 2016-04-20 ENCOUNTER — Ambulatory Visit (INDEPENDENT_AMBULATORY_CARE_PROVIDER_SITE_OTHER): Payer: 59

## 2016-04-20 DIAGNOSIS — Z111 Encounter for screening for respiratory tuberculosis: Secondary | ICD-10-CM

## 2016-04-22 LAB — TB SKIN TEST
INDURATION: 0 mm
TB Skin Test: NEGATIVE

## 2016-05-04 ENCOUNTER — Ambulatory Visit: Payer: 59 | Admitting: Sports Medicine

## 2016-05-04 ENCOUNTER — Ambulatory Visit (INDEPENDENT_AMBULATORY_CARE_PROVIDER_SITE_OTHER): Payer: 59 | Admitting: Sports Medicine

## 2016-05-04 ENCOUNTER — Encounter: Payer: Self-pay | Admitting: Sports Medicine

## 2016-05-04 VITALS — BP 160/100 | HR 75 | Ht 68.0 in

## 2016-05-04 DIAGNOSIS — R2689 Other abnormalities of gait and mobility: Secondary | ICD-10-CM | POA: Diagnosis not present

## 2016-05-04 DIAGNOSIS — S93432A Sprain of tibiofibular ligament of left ankle, initial encounter: Secondary | ICD-10-CM

## 2016-05-04 DIAGNOSIS — I10 Essential (primary) hypertension: Secondary | ICD-10-CM | POA: Diagnosis not present

## 2016-05-04 NOTE — Assessment & Plan Note (Signed)
Pepcid in addition to ibuprofen for GI prophylaxis
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
## 2016-05-04 NOTE — Assessment & Plan Note (Signed)
Underlying poor motor control degenerative degenerative process.  Will need therapeutic exercises which will need to be adapted.  Gentle range of motion exercises and stage I rehab reviewed in detail with athletic training staff.

## 2016-05-07 ENCOUNTER — Encounter: Payer: Self-pay | Admitting: Internal Medicine

## 2016-05-08 MED ORDER — METOPROLOL SUCCINATE ER 25 MG PO TB24: 25.0000 mg | ORAL_TABLET | Freq: Every day | ORAL | 3 refills | Status: DC

## 2016-06-01 NOTE — Assessment & Plan Note (Addendum)
She continues to have pain across the anterior aspect of her tibiotalar junction. We will plan to have her come back in 5 weeks for reevaluation but I like for her to continue in this due to her underlying  neurologic issue and having to rely on this ankle independently for transfers and concern for potential reinjury. If persistent pain at that time will need repeat x-rays.Marland Kitchen

## 2016-06-01 NOTE — Assessment & Plan Note (Signed)
Elevated today.  We will have her try to wean off of the NSAIDs this may be contributing.

## 2016-06-08 ENCOUNTER — Ambulatory Visit: Payer: 59 | Admitting: Sports Medicine

## 2016-06-12 ENCOUNTER — Encounter: Payer: Self-pay | Admitting: Sports Medicine

## 2016-10-23 ENCOUNTER — Other Ambulatory Visit: Payer: Self-pay | Admitting: Internal Medicine

## 2016-10-23 DIAGNOSIS — J209 Acute bronchitis, unspecified: Secondary | ICD-10-CM | POA: Diagnosis not present

## 2016-10-23 DIAGNOSIS — E669 Obesity, unspecified: Secondary | ICD-10-CM

## 2017-01-08 ENCOUNTER — Telehealth: Payer: Self-pay | Admitting: Internal Medicine

## 2017-01-08 NOTE — Telephone Encounter (Signed)
We cannot send in a referral to [facility]. We need to know what kind of wheelchair that she needs (the name or model etc) so that we can send the prescription to [facility] for them to fulfill.

## 2017-01-08 NOTE — Telephone Encounter (Signed)
Copied from [place]. Topic: Referral - Request >> Jan 08, 2017  3:47 PM Clack, Laban Emperor wrote: Reason for CRM: Pt states she needs a referral sent over [facility]. Just stating why she a wheelchair, they adv her they also need: the reason why, what type of wheelchair, height and weight.  [facility] info: [phone], fax# [phone] Attn: Referral support

## 2017-01-11 NOTE — Telephone Encounter (Signed)
States that she has a drive wheel chair now and she said that it is really heavy and does not know what would be better. States she will call and find out which chair and give [place] that information and call back

## 2017-01-14 NOTE — Telephone Encounter (Signed)
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

## 2017-01-18 NOTE — Telephone Encounter (Signed)
Rx to [facility]

## 2017-01-18 NOTE — Telephone Encounter (Signed)
Rx Faxed  

## 2017-03-04 ENCOUNTER — Other Ambulatory Visit: Payer: Self-pay | Admitting: Internal Medicine

## 2017-03-25 DIAGNOSIS — D259 Leiomyoma of uterus, unspecified: Secondary | ICD-10-CM | POA: Diagnosis not present

## 2017-03-25 DIAGNOSIS — N920 Excessive and frequent menstruation with regular cycle: Secondary | ICD-10-CM | POA: Diagnosis not present

## 2017-04-12 ENCOUNTER — Other Ambulatory Visit (INDEPENDENT_AMBULATORY_CARE_PROVIDER_SITE_OTHER): Payer: 59

## 2017-04-12 ENCOUNTER — Ambulatory Visit (INDEPENDENT_AMBULATORY_CARE_PROVIDER_SITE_OTHER): Payer: 59 | Admitting: Internal Medicine

## 2017-04-12 ENCOUNTER — Encounter: Payer: Self-pay | Admitting: Internal Medicine

## 2017-04-12 VITALS — BP 138/90 | HR 74 | Temp 98.5°F | Ht 68.0 in

## 2017-04-12 DIAGNOSIS — Z0000 Encounter for general adult medical examination without abnormal findings: Secondary | ICD-10-CM | POA: Diagnosis not present

## 2017-04-12 DIAGNOSIS — E785 Hyperlipidemia, unspecified: Secondary | ICD-10-CM

## 2017-04-12 DIAGNOSIS — K219 Gastro-esophageal reflux disease without esophagitis: Secondary | ICD-10-CM | POA: Diagnosis not present

## 2017-04-12 DIAGNOSIS — I10 Essential (primary) hypertension: Secondary | ICD-10-CM

## 2017-04-12 DIAGNOSIS — I471 Supraventricular tachycardia: Secondary | ICD-10-CM | POA: Diagnosis not present

## 2017-04-12 LAB — LIPID PANEL
CHOLESTEROL: 182 mg/dL (ref 0–200)
HDL: 33.9 mg/dL — ABNORMAL LOW (ref >39.00)
LDL Cholesterol: 137 mg/dL — ABNORMAL HIGH (ref 0–99)
NonHDL: 147.69
Total CHOL/HDL Ratio: 5
Triglycerides: 55 mg/dL (ref 0.0–149.0)
VLDL: 11 mg/dL (ref 0.0–40.0)

## 2017-04-12 LAB — CBC
HEMATOCRIT: 36.6 % (ref 36.0–46.0)
HEMOGLOBIN: 12.2 g/dL (ref 12.0–15.0)
MCHC: 33.4 g/dL (ref 30.0–36.0)
MCV: 96 fl (ref 78.0–100.0)
Platelets: 226 10*3/uL (ref 150.0–400.0)
RBC: 3.81 Mil/uL — AB (ref 3.87–5.11)
RDW: 12.3 % (ref 11.5–15.5)
WBC: 7.1 10*3/uL (ref 4.0–10.5)

## 2017-04-12 LAB — COMPREHENSIVE METABOLIC PANEL
ALBUMIN: 3.6 g/dL (ref 3.5–5.2)
ALK PHOS: 53 U/L (ref 39–117)
ALT: 13 U/L (ref 0–35)
AST: 18 U/L (ref 0–37)
BILIRUBIN TOTAL: 0.5 mg/dL (ref 0.2–1.2)
BUN: 9 mg/dL (ref 6–23)
CO2: 28 mEq/L (ref 19–32)
Calcium: 9 mg/dL (ref 8.4–10.5)
Chloride: 103 mEq/L (ref 96–112)
Creatinine, Ser: 0.74 mg/dL (ref 0.40–1.20)
GFR: 111.2 mL/min (ref >60.00)
Glucose, Bld: 89 mg/dL (ref 70–99)
POTASSIUM: 3.9 meq/L (ref 3.5–5.1)
SODIUM: 137 meq/L (ref 135–145)
TOTAL PROTEIN: 7.8 g/dL (ref 6.0–8.3)

## 2017-04-12 LAB — VITAMIN D 25 HYDROXY (VIT D DEFICIENCY, FRACTURES): VITD: 15.8 ng/mL — ABNORMAL LOW (ref 30.00–100.00)

## 2017-04-12 NOTE — Patient Instructions (Addendum)
It is okay to use flonase, affrin (only if needed) or claritin/allegra/zyrtec. Do not take sudafed or phenylephrine containing cold products.   Health Maintenance, Female Adopting a healthy lifestyle and getting preventive care can go a long way to promote health and wellness. Talk with your health care provider about what schedule of regular examinations is right for you. This is a good chance for you to check in with your provider about disease prevention and staying healthy. In between checkups, there are plenty of things you can do on your own. Experts have done a lot of research about which lifestyle changes and preventive measures are most likely to keep you healthy. Ask your health care provider for more information. Weight and diet Eat a healthy diet  Be sure to include plenty of vegetables, fruits, low-fat dairy products, and lean protein.  Do not eat a lot of foods high in solid fats, added sugars, or salt.  Get regular exercise. This is one of the most important things you can do for your health. ? Most adults should exercise for at least 150 minutes each week. The exercise should increase your heart rate and make you sweat (moderate-intensity exercise). ? Most adults should also do strengthening exercises at least twice a week. This is in addition to the moderate-intensity exercise.  Maintain a healthy weight  Body mass index (BMI) is a measurement that can be used to identify possible weight problems. It estimates body fat based on height and weight. Your health care provider can help determine your BMI and help you achieve or maintain a healthy weight.  For females 26 years of age and older: ? A BMI below 18.5 is considered underweight. ? A BMI of 18.5 to 24.9 is normal. ? A BMI of 25 to 29.9 is considered overweight. ? A BMI of 30 and above is considered obese.  Watch levels of cholesterol and blood lipids  You should start having your blood tested for lipids and  cholesterol at 41 years of age, then have this test every 5 years.  You may need to have your cholesterol levels checked more often if: ? Your lipid or cholesterol levels are high. ? You are older than 41 years of age. ? You are at high risk for heart disease.  Cancer screening Lung Cancer  Lung cancer screening is recommended for adults 16-58 years old who are at high risk for lung cancer because of a history of smoking.  A yearly low-dose CT scan of the lungs is recommended for people who: ? Currently smoke. ? Have quit within the past 15 years. ? Have at least a 30-pack-year history of smoking. A pack year is smoking an average of one pack of cigarettes a day for 1 year.  Yearly screening should continue until it has been 15 years since you quit.  Yearly screening should stop if you develop a health problem that would prevent you from having lung cancer treatment.  Breast Cancer  Practice breast self-awareness. This means understanding how your breasts normally appear and feel.  It also means doing regular breast self-exams. Let your health care provider know about any changes, no matter how small.  If you are in your 20s or 30s, you should have a clinical breast exam (CBE) by a health care provider every 1-3 years as part of a regular health exam.  If you are 62 or older, have a CBE every year. Also consider having a breast X-ray (mammogram) every year.  If you  have a family history of breast cancer, talk to your health care provider about genetic screening.  If you are at high risk for breast cancer, talk to your health care provider about having an MRI and a mammogram every year.  Breast cancer gene (BRCA) assessment is recommended for women who have family members with BRCA-related cancers. BRCA-related cancers include: ? Breast. ? Ovarian. ? Tubal. ? Peritoneal cancers.  Results of the assessment will determine the need for genetic counseling and BRCA1 and BRCA2  testing.  Cervical Cancer Your health care provider may recommend that you be screened regularly for cancer of the pelvic organs (ovaries, uterus, and vagina). This screening involves a pelvic examination, including checking for microscopic changes to the surface of your cervix (Pap test). You may be encouraged to have this screening done every 3 years, beginning at age 43.  For women ages 21-65, health care providers may recommend pelvic exams and Pap testing every 3 years, or they may recommend the Pap and pelvic exam, combined with testing for human papilloma virus (HPV), every 5 years. Some types of HPV increase your risk of cervical cancer. Testing for HPV may also be done on women of any age with unclear Pap test results.  Other health care providers may not recommend any screening for nonpregnant women who are considered low risk for pelvic cancer and who do not have symptoms. Ask your health care provider if a screening pelvic exam is right for you.  If you have had past treatment for cervical cancer or a condition that could lead to cancer, you need Pap tests and screening for cancer for at least 20 years after your treatment. If Pap tests have been discontinued, your risk factors (such as having a new sexual partner) need to be reassessed to determine if screening should resume. Some women have medical problems that increase the chance of getting cervical cancer. In these cases, your health care provider may recommend more frequent screening and Pap tests.  Colorectal Cancer  This type of cancer can be detected and often prevented.  Routine colorectal cancer screening usually begins at 41 years of age and continues through 41 years of age.  Your health care provider may recommend screening at an earlier age if you have risk factors for colon cancer.  Your health care provider may also recommend using home test kits to check for hidden blood in the stool.  A small camera at the end of a  tube can be used to examine your colon directly (sigmoidoscopy or colonoscopy). This is done to check for the earliest forms of colorectal cancer.  Routine screening usually begins at age 48.  Direct examination of the colon should be repeated every 5-10 years through 41 years of age. However, you may need to be screened more often if early forms of precancerous polyps or small growths are found.  Skin Cancer  Check your skin from head to toe regularly.  Tell your health care provider about any new moles or changes in moles, especially if there is a change in a mole's shape or color.  Also tell your health care provider if you have a mole that is larger than the size of a pencil eraser.  Always use sunscreen. Apply sunscreen liberally and repeatedly throughout the day.  Protect yourself by wearing long sleeves, pants, a wide-brimmed hat, and sunglasses whenever you are outside.  Heart disease, diabetes, and high blood pressure  High blood pressure causes heart disease and increases the  risk of stroke. High blood pressure is more likely to develop in: ? People who have blood pressure in the high end of the normal range (130-139/85-89 mm Hg). ? People who are overweight or obese. ? People who are African American.  If you are 82-64 years of age, have your blood pressure checked every 3-5 years. If you are 73 years of age or older, have your blood pressure checked every year. You should have your blood pressure measured twice-once when you are at a hospital or clinic, and once when you are not at a hospital or clinic. Record the average of the two measurements. To check your blood pressure when you are not at a hospital or clinic, you can use: ? An automated blood pressure machine at a pharmacy. ? A home blood pressure monitor.  If you are between 37 years and 7 years old, ask your health care provider if you should take aspirin to prevent strokes.  Have regular diabetes screenings. This  involves taking a blood sample to check your fasting blood sugar level. ? If you are at a normal weight and have a low risk for diabetes, have this test once every three years after 41 years of age. ? If you are overweight and have a high risk for diabetes, consider being tested at a younger age or more often. Preventing infection Hepatitis B  If you have a higher risk for hepatitis B, you should be screened for this virus. You are considered at high risk for hepatitis B if: ? You were born in a country where hepatitis B is common. Ask your health care provider which countries are considered high risk. ? Your parents were born in a high-risk country, and you have not been immunized against hepatitis B (hepatitis B vaccine). ? You have HIV or AIDS. ? You use needles to inject street drugs. ? You live with someone who has hepatitis B. ? You have had sex with someone who has hepatitis B. ? You get hemodialysis treatment. ? You take certain medicines for conditions, including cancer, organ transplantation, and autoimmune conditions.  Hepatitis C  Blood testing is recommended for: ? Everyone born from [birth year] through 1964. ? Anyone with known risk factors for hepatitis C.  Sexually transmitted infections (STIs)  You should be screened for sexually transmitted infections (STIs) including gonorrhea and chlamydia if: ? You are sexually active and are younger than 41 years of age. ? You are older than 41 years of age and your health care provider tells you that you are at risk for this type of infection. ? Your sexual activity has changed since you were last screened and you are at an increased risk for chlamydia or gonorrhea. Ask your health care provider if you are at risk.  If you do not have HIV, but are at risk, it may be recommended that you take a prescription medicine daily to prevent HIV infection. This is called pre-exposure prophylaxis (PrEP). You are considered at risk if: ? You are  sexually active and do not regularly use condoms or know the HIV status of your partner(s). ? You take drugs by injection. ? You are sexually active with a partner who has HIV.  Talk with your health care provider about whether you are at high risk of being infected with HIV. If you choose to begin PrEP, you should first be tested for HIV. You should then be tested every 3 months for as long as you are taking PrEP. Pregnancy  If you are premenopausal and you may become pregnant, ask your health care provider about preconception counseling.  If you may become pregnant, take 400 to 800 micrograms (mcg) of folic acid every day.  If you want to prevent pregnancy, talk to your health care provider about birth control (contraception). Osteoporosis and menopause  Osteoporosis is a disease in which the bones lose minerals and strength with aging. This can result in serious bone fractures. Your risk for osteoporosis can be identified using a bone density scan.  If you are 8 years of age or older, or if you are at risk for osteoporosis and fractures, ask your health care provider if you should be screened.  Ask your health care provider whether you should take a calcium or vitamin D supplement to lower your risk for osteoporosis.  Menopause may have certain physical symptoms and risks.  Hormone replacement therapy may reduce some of these symptoms and risks. Talk to your health care provider about whether hormone replacement therapy is right for you. Follow these instructions at home:  Schedule regular health, dental, and eye exams.  Stay current with your immunizations.  Do not use any tobacco products including cigarettes, chewing tobacco, or electronic cigarettes.  If you are pregnant, do not drink alcohol.  If you are breastfeeding, limit how much and how often you drink alcohol.  Limit alcohol intake to no more than 1 drink per day for nonpregnant women. One drink equals 12 ounces of  beer, 5 ounces of wine, or 1 ounces of hard liquor.  Do not use street drugs.  Do not share needles.  Ask your health care provider for help if you need support or information about quitting drugs.  Tell your health care provider if you often feel depressed.  Tell your health care provider if you have ever been abused or do not feel safe at home. This information is not intended to replace advice given to you by your health care provider. Make sure you discuss any questions you have with your health care provider. Document Released: 07/07/2010 Document Revised: 05/30/2015 Document Reviewed: 09/25/2014 Elsevier Interactive Patient Education  [employer].

## 2017-04-12 NOTE — Assessment & Plan Note (Signed)
No episodes. Taking metoprolol for rate control.

## 2017-04-12 NOTE — Assessment & Plan Note (Signed)
Pepcid controlling symptoms.

## 2017-04-12 NOTE — Assessment & Plan Note (Signed)
Up to date on pap smear, declines tetanus today. Flu reminded yearly. Counseled about exercise and stress reducing methods. Given screening recommendations.

## 2017-04-12 NOTE — Assessment & Plan Note (Signed)
BP at goal today on metoprolol. Checking CMP and adjust if needed.

## 2017-04-12 NOTE — Assessment & Plan Note (Signed)
Crestor 10 mg daily and checking lipid panel and adjust as needed.

## 2017-04-12 NOTE — Progress Notes (Signed)
   Subjective:    Patient ID: Sara Hunt, female    DOB: [birth year], 42 y.o.   MRN: [id number]  HPI The patient is a 42 YO female coming in for physical. Getting partial hysterectomy for likely fibroids soon. Some increased stress at work.  PMH, [facility], social history reviewed and updated.   Review of Systems  Constitutional: Negative.   HENT: Negative.   Eyes: Negative.   Respiratory: Negative for cough, chest tightness and shortness of breath.   Cardiovascular: Negative for chest pain, palpitations and leg swelling.  Gastrointestinal: Negative for abdominal distention, abdominal pain, constipation, diarrhea, nausea and vomiting.  Genitourinary: Positive for vaginal bleeding and vaginal pain.  Musculoskeletal: Positive for gait problem.  Skin: Negative.   Neurological: Negative for dizziness, tremors, seizures, syncope, facial asymmetry, speech difficulty and headaches.  Psychiatric/Behavioral: Negative.       Objective:   Physical Exam  Constitutional: She is oriented to person, place, and time. She appears well-developed and well-nourished.  overweight  HENT:  Head: Normocephalic and atraumatic.  Eyes: EOM are normal.  Neck: Normal range of motion.  Cardiovascular: Normal rate and regular rhythm.  Pulmonary/Chest: Effort normal and breath sounds normal. No respiratory distress. She has no wheezes. She has no rales.  Abdominal: Soft. Bowel sounds are normal. She exhibits no distension. There is no tenderness. There is no rebound.  Musculoskeletal: She exhibits no edema.  Neurological: She is alert and oriented to person, place, and time. Coordination abnormal.  Wheelchair dependent  Skin: Skin is warm and dry.  Psychiatric: She has a normal mood and affect.   Vitals:   04/12/17 1503 04/12/17 1534  BP: (!) 140/100 138/90  Pulse: 74   Temp: 98.5 F (36.9 C)   TempSrc: Oral   SpO2: 98%   Height: 5\' 8"  (1.727 m)       Assessment & Plan:

## 2017-05-18 ENCOUNTER — Other Ambulatory Visit: Payer: Self-pay | Admitting: Internal Medicine

## 2017-05-24 ENCOUNTER — Ambulatory Visit: Payer: 59 | Admitting: Sports Medicine

## 2017-05-24 ENCOUNTER — Other Ambulatory Visit: Payer: Self-pay

## 2017-05-24 DIAGNOSIS — R2689 Other abnormalities of gait and mobility: Secondary | ICD-10-CM | POA: Diagnosis not present

## 2017-05-24 DIAGNOSIS — M25562 Pain in left knee: Secondary | ICD-10-CM

## 2017-05-24 MED ORDER — METOPROLOL SUCCINATE ER 25 MG PO TB24: 25.0000 mg | ORAL_TABLET | Freq: Every day | ORAL | 11 refills | Status: DC

## 2017-05-24 NOTE — Progress Notes (Signed)
Sara Hunt. Sara Hunt, [facility]  PARI LOMBARD - 42 y.o. female MRN [id number]  Date of birth: [birth year]  Visit Date: 05/24/2017  PCP: Sara Koch, MD   Referred by: Sara Hunt, *  Scribe for today's visit: Sara Hunt, CMA     SUBJECTIVE:  Sara Hunt "Sara Hunt" is here for L knee pain .  Her L knee pain symptoms INITIALLY: Began awhile ago and has been off and on. This episode of pain started about 2 months ago and got worse over the weekend.  Described as severe (8/10) stinging, nonradiating Worsened with bending the knee Improved with straightening the knee.  Additional associated symptoms include: She has noticed some swelling around the knee and ROM has decreased. Occasionally she will have similar, less severe, sx in R knee. She feels like her patella is moving laterally. She does hear popping in the knee when riding her bike. She has hx of L-sided sciatica and this has caused knee pain in the past. She denies tightness on the posterior aspect of the knee.     At this time symptoms are improving compared to onset  (4/10 today but was 8/10 over the weekend).  She has been taking IBU with no relief. She has iced the knee (over the weekend) and did get some relief.   ROS Denies night time disturbances. Denies fevers, chills, or night sweats. Denies unexplained weight loss. Denies personal history of cancer. Reports changes in bowel or bladder habits (fibroid). Denies recent unreported falls. Denies new or worsening dyspnea or wheezing. Denies headaches or dizziness.  Reports numbness, tingling or weakness in the extremities (sciatica).  Denies dizziness or presyncopal episodes Reports lower extremity edema    HISTORY & PERTINENT PRIOR DATA:  Prior History reviewed and updated per electronic medical record.  Significant/pertinent history, findings, studies include:  reports that she  quit smoking about 23 years ago. She has never used smokeless tobacco. No results for input(s): HGBA1C, LABURIC, CREATINE in the last 8760 hours. No specialty comments available. No problems updated.  OBJECTIVE:  VS:  HT:    WT:   BMI:     BP:   HR: bpm  TEMP: ( )  RESP:    PHYSICAL EXAM: Constitutional: WDWN, Non-toxic appearing. Psychiatric: Alert & appropriately interactive.  Not depressed or anxious appearing. Respiratory: No increased work of breathing.  Trachea Midline Eyes: Pupils are equal.  EOM intact without nystagmus.  No scleral icterus  Vascular Exam: warm to touch no edema  lower extremity neuro exam: Generalized spasticity left greater than right.  She is in a wheelchair.  MSK Exam: Left knee is overall well aligned.  No significant effusion.  She does have some generalized synovitis.  Mild pain with palpation of Hoffa's fat pad but pain is most focally over the medial joint line.  Pain with McMurray's.  No appreciable clicking or popping.  Extensor mechanism intact.   ASSESSMENT & PLAN:  No diagnosis found.  PLAN:  Left knee injection performed today.  Avoid exacerbating activities.  Follow-up in 6 weeks to ensure clinical resolution.   Follow-up: Return in about 6 weeks (around 07/05/2017).        Please see additional documentation for Objective, Assessment and Plan sections. Pertinent additional documentation may be included in corresponding procedure notes, imaging studies, problem based documentation and patient instructions. Please see these sections of the encounter for additional information regarding this visit.  CMA/ATC served as Education administrator during this visit. History, Physical, and Plan performed by medical provider. Documentation and orders reviewed and attested to.      Sara Hunt, [facility] Physician

## 2017-05-24 NOTE — Patient Instructions (Signed)

## 2017-05-24 NOTE — Progress Notes (Signed)
PROCEDURE NOTE : Left knee  Injection After discussing the risks, benefits and expected outcomes of the injection and all questions were reviewed and answered,  she wished to undergo the above named procedure.  Written consent was obtained. After an appropriate time out was taken the target structure prepped and injected as below: Prep:  Betadine and alcohol,  Approach:  anteriomedial  Anesthes  Ethel chloride,   Needle:  22g 1.5"  Aspirate:  n/a  Meds:  3cc 0.5% Marcaine + 1cc 40mg /mL Depo-Medrol  Dressing:  Bandaid  This procedure was well tolerated and there were no complications.

## 2017-05-26 ENCOUNTER — Telehealth: Payer: Self-pay | Admitting: Internal Medicine

## 2017-05-26 NOTE — Telephone Encounter (Signed)
Spoke with patient she needed labs sent to Dr. Benjie Karvonen and I have routed them to her. Patient informed

## 2017-05-26 NOTE — Telephone Encounter (Signed)
Copied from [place] [id number]. Topic: Quick Communication - See Telephone Encounter >> May 26, 2017  3:09 PM Sara Hunt wrote: CRM for notification. See Telephone encounter for: 05/26/17.  Patient states she was in the office for her yearly annual on 4/8 and that Dr Sharlet Salina was going to send her results to her obgyn @ [facility] for her to have clearance for her surgery that she is having coming up. They can not schedule it until it is received. Please advise.

## 2017-06-04 ENCOUNTER — Telehealth: Payer: Self-pay | Admitting: Internal Medicine

## 2017-06-04 NOTE — Telephone Encounter (Signed)
Copied from [place] [id number]. Topic: Quick Communication - See Telephone Encounter >> Jun 04, 2017  4:21 PM Percell Belt A wrote: CRM for notification. See Telephone encounter for: 06/04/17.  Pt called in and stated that Dr Paulla Fore told her that he was going to call something in for pain .  She stated that her pharmacy doesn't have anything.  She said that her knee is still hurting for the shot that she was give this week   Please advise  Best number [phone]

## 2017-06-07 NOTE — Telephone Encounter (Signed)
Forwarding to Dr. Rigby to advise.  

## 2017-06-09 ENCOUNTER — Telehealth: Payer: Self-pay

## 2017-06-09 NOTE — Telephone Encounter (Signed)
Copied from [place] [phone]. Topic: Quick Communication - See Telephone Encounter >> Jun 09, 2017  2:19 PM Antonieta Iba C wrote: CRM for notification. See Telephone encounter for: 06/09/17.  Dr. Ilean Skill office called in to follow up on clearance form. Pt has an apt with their office on Friday. If form isn't completed and faxed back they will have to reschedule pt's apt.   Office is re-faxing and would like to make provider aware to be looking out for clearance form.  Left message advising patient that dr Sharlet Salina is out of the office this week, I don't know who dr Harriette Ohara is (no phone number for his office on CRM) and i'm not sure what kind of procedure her appt with dr Harriette Ohara is about---i'm not showing that she has seen any other provider in our office recently and i'm very doubtful another pcp could complete the form appropriately, unless this is a form dr Paulla Fore could complete, patient saw dr Paulla Fore in the past---patient advised to please reschedule appt with dr Harriette Ohara if possible so that form can be completed by dr Sharlet Salina after she returns next week---if appt cannot be rescheduled with dr Harriette Ohara, call our office back, we will try to do what we can in dr crawford's absence.

## 2017-07-01 ENCOUNTER — Encounter: Payer: Self-pay | Admitting: Internal Medicine

## 2017-07-01 ENCOUNTER — Ambulatory Visit: Payer: 59 | Admitting: Internal Medicine

## 2017-07-01 DIAGNOSIS — H66005 Acute suppurative otitis media without spontaneous rupture of ear drum, recurrent, left ear: Secondary | ICD-10-CM

## 2017-07-01 MED ORDER — AMOXICILLIN-POT CLAVULANATE 875-125 MG PO TABS: 1.0000 | ORAL_TABLET | Freq: Two times a day (BID) | ORAL | 0 refills | Status: DC

## 2017-07-01 NOTE — Patient Instructions (Signed)
We have sent in augmentin to take 1 pill twice a day for 1 week to clear up the ear.   You could try taking zyrtec to see if this helps to drain the fluid as well.

## 2017-07-02 DIAGNOSIS — H6692 Otitis media, unspecified, left ear: Secondary | ICD-10-CM | POA: Insufficient documentation
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
## 2017-07-02 NOTE — Assessment & Plan Note (Signed)
Rx for augmentin for the infection.

## 2017-07-05 ENCOUNTER — Ambulatory Visit: Payer: 59 | Admitting: Sports Medicine

## 2017-07-05 DIAGNOSIS — Z0289 Encounter for other administrative examinations: Secondary | ICD-10-CM

## 2017-07-06 ENCOUNTER — Encounter: Payer: Self-pay | Admitting: Sports Medicine

## 2017-10-15 ENCOUNTER — Other Ambulatory Visit: Payer: Self-pay | Admitting: Obstetrics & Gynecology

## 2017-10-15 NOTE — Patient Instructions (Addendum)
Your procedure is scheduled on: Thursday October 28, 2017 at 1:00 pm  Enter through the Main Entrance of [facility] at: 11:30 am  Pick up the phone at the desk and dial [phone].  Call this number if you have problems the morning of surgery: [phone].  Remember: Do NOT eat food: after Midnight on Wednesday October 23  Do NOT drink clear liquids after: 7:00 am day of surgery  Take these medicines the morning of surgery with a SIP OF WATER:  None  STOP ALL VITAMINS, HERBAL MEDICATIONS, SUPPLEMENTS, NSAIDS/Ibuprofen NOW.  You may use tylenol if needed.  BRUSH YOUR TEETH DAY OF SURGERY   Do NOT wear jewelry (body piercing), metal hair clips/bobby pins, make-up, or nail polish. Do NOT wear lotions, powders, or perfumes.  You may wear deoderant. Do NOT shave for 48 hours prior to surgery. Do NOT bring valuables to the hospital.   Leave suitcase in car.  After surgery it may be brought to your room.    For patients admitted to the hospital, checkout time is 11:00 AM the day of discharge.  Home with Sister Mearl Latin cell [phone]

## 2017-10-20 ENCOUNTER — Other Ambulatory Visit: Payer: Self-pay

## 2017-10-20 ENCOUNTER — Encounter (HOSPITAL_COMMUNITY): Payer: Self-pay

## 2017-10-20 ENCOUNTER — Encounter (HOSPITAL_COMMUNITY)
Admission: RE | Admit: 2017-10-20 | Discharge: 2017-10-20 | Disposition: A | Discharge status: Home / Self Care | Payer: 59 | Source: Ambulatory Visit | Attending: Obstetrics & Gynecology | Admitting: Obstetrics & Gynecology

## 2017-10-20 DIAGNOSIS — Z01812 Encounter for preprocedural laboratory examination: Secondary | ICD-10-CM | POA: Insufficient documentation

## 2017-10-20 HISTORY — DX: Cardiac arrhythmia, unspecified: I49.9

## 2017-10-20 HISTORY — DX: Dependence on wheelchair: Z99.3

## 2017-10-20 LAB — CBC
HEMATOCRIT: 37.5 % (ref 36.0–46.0)
HEMOGLOBIN: 12.2 g/dL (ref 12.0–15.0)
MCH: 30.9 pg (ref 26.0–34.0)
MCHC: 32.5 g/dL (ref 30.0–36.0)
MCV: 94.9 fL (ref 80.0–100.0)
Platelets: 244 10*3/uL (ref 150–400)
RBC: 3.95 MIL/uL (ref 3.87–5.11)
RDW: 12.4 % (ref 11.5–15.5)
WBC: 6.9 10*3/uL (ref 4.0–10.5)
nRBC: 0 % (ref 0.0–0.2)

## 2017-10-20 LAB — COMPREHENSIVE METABOLIC PANEL
ALBUMIN: 3.5 g/dL (ref 3.5–5.0)
ALK PHOS: 49 U/L (ref 38–126)
ALT: 15 U/L (ref 0–44)
AST: 18 U/L (ref 15–41)
Anion gap: 6 (ref 5–15)
BILIRUBIN TOTAL: 0.6 mg/dL (ref 0.3–1.2)
BUN: 16 mg/dL (ref 6–20)
CALCIUM: 9.1 mg/dL (ref 8.9–10.3)
CO2: 27 mmol/L (ref 22–32)
Chloride: 103 mmol/L (ref 98–111)
Creatinine, Ser: 0.77 mg/dL (ref 0.44–1.00)
GFR calc Af Amer: >60 mL/min (ref >60)
GLUCOSE: 92 mg/dL (ref 70–99)
POTASSIUM: 3.4 mmol/L — AB (ref 3.5–5.1)
Sodium: 136 mmol/L (ref 135–145)
TOTAL PROTEIN: 8.2 g/dL — AB (ref 6.5–8.1)

## 2017-10-20 LAB — TYPE AND SCREEN
ABO/RH(D): O POS
Antibody Screen: NEGATIVE

## 2017-10-20 LAB — ABO/RH: ABO/RH(D): O POS

## 2017-10-28 ENCOUNTER — Inpatient Hospital Stay (HOSPITAL_COMMUNITY): Payer: 59 | Admitting: Anesthesiology

## 2017-10-28 ENCOUNTER — Encounter (HOSPITAL_COMMUNITY)
Admission: RE | Disposition: A | Discharge status: Home / Self Care | Payer: Self-pay | Source: Home / Self Care | Attending: Obstetrics & Gynecology

## 2017-10-28 ENCOUNTER — Other Ambulatory Visit: Payer: Self-pay

## 2017-10-28 ENCOUNTER — Encounter (HOSPITAL_COMMUNITY): Payer: Self-pay

## 2017-10-28 ENCOUNTER — Inpatient Hospital Stay (HOSPITAL_COMMUNITY)
Admission: RE | Admit: 2017-10-28 | Discharge: 2017-10-31 | DRG: 743 | Disposition: A | Discharge status: Home / Self Care | Payer: 59 | Attending: Obstetrics & Gynecology | Admitting: Obstetrics & Gynecology

## 2017-10-28 DIAGNOSIS — N920 Excessive and frequent menstruation with regular cycle: Secondary | ICD-10-CM | POA: Diagnosis not present

## 2017-10-28 DIAGNOSIS — D259 Leiomyoma of uterus, unspecified: Secondary | ICD-10-CM | POA: Diagnosis not present

## 2017-10-28 DIAGNOSIS — Z993 Dependence on wheelchair: Secondary | ICD-10-CM

## 2017-10-28 DIAGNOSIS — Z90710 Acquired absence of both cervix and uterus: Secondary | ICD-10-CM | POA: Diagnosis present

## 2017-10-28 DIAGNOSIS — Z87891 Personal history of nicotine dependence: Secondary | ICD-10-CM | POA: Diagnosis not present

## 2017-10-28 DIAGNOSIS — I10 Essential (primary) hypertension: Secondary | ICD-10-CM | POA: Diagnosis present

## 2017-10-28 HISTORY — PX: HYSTERECTOMY ABDOMINAL WITH SALPINGECTOMY: SHX6725

## 2017-10-28 LAB — PREGNANCY, URINE: Preg Test, Ur: NEGATIVE

## 2017-10-28 SURGERY — HYSTERECTOMY, TOTAL, ABDOMINAL, WITH SALPINGECTOMY
Anesthesia: General | Site: Abdomen | Laterality: Bilateral

## 2017-10-28 MED ORDER — METOCLOPRAMIDE HCL 5 MG/ML IJ SOLN
INTRAMUSCULAR | Status: AC
  Filled 2017-10-28: qty 2

## 2017-10-28 MED ORDER — MENTHOL 3 MG MT LOZG: 1.0000 | LOZENGE | OROMUCOSAL | Status: DC | PRN

## 2017-10-28 MED ORDER — MIDAZOLAM HCL 2 MG/2ML IJ SOLN
INTRAMUSCULAR | Status: DC | PRN
  Administered 2017-10-28: 2 mg via INTRAVENOUS

## 2017-10-28 MED ORDER — ONDANSETRON HCL 4 MG/2ML IJ SOLN: 4.0000 mg | Freq: Four times a day (QID) | INTRAMUSCULAR | Status: DC | PRN

## 2017-10-28 MED ORDER — DIPHENHYDRAMINE HCL 12.5 MG/5ML PO ELIX: 12.5000 mg | ORAL_SOLUTION | Freq: Four times a day (QID) | ORAL | Status: DC | PRN

## 2017-10-28 MED ORDER — ENOXAPARIN SODIUM 40 MG/0.4ML ~~LOC~~ SOLN: 40.0000 mg | SUBCUTANEOUS | Status: DC

## 2017-10-28 MED ORDER — ENOXAPARIN SODIUM 40 MG/0.4ML ~~LOC~~ SOLN
40.0000 mg | SUBCUTANEOUS | Status: DC
  Administered 2017-10-28 – 2017-10-30 (×3): 40 mg via SUBCUTANEOUS
  Filled 2017-10-28 (×3): qty 0.4

## 2017-10-28 MED ORDER — VASOPRESSIN 20 UNIT/ML IV SOLN
INTRAVENOUS | Status: DC | PRN
  Administered 2017-10-28: 14 mL via INTRAMUSCULAR

## 2017-10-28 MED ORDER — DEXTROSE 5 % IV SOLN
3.0000 g | INTRAVENOUS | Status: DC
  Filled 2017-10-28: qty 3000

## 2017-10-28 MED ORDER — SODIUM CHLORIDE 0.9% FLUSH: 9.0000 mL | INTRAVENOUS | Status: DC | PRN

## 2017-10-28 MED ORDER — SUGAMMADEX SODIUM 200 MG/2ML IV SOLN
INTRAVENOUS | Status: DC | PRN
  Administered 2017-10-28: 220 mg via INTRAVENOUS

## 2017-10-28 MED ORDER — DEXAMETHASONE SODIUM PHOSPHATE 10 MG/ML IJ SOLN
INTRAMUSCULAR | Status: AC
  Filled 2017-10-28: qty 1

## 2017-10-28 MED ORDER — LACTATED RINGERS IV SOLN
INTRAVENOUS | Status: DC
  Administered 2017-10-28: 75 mL/h via INTRAVENOUS
  Administered 2017-10-28 (×2): via INTRAVENOUS

## 2017-10-28 MED ORDER — FENTANYL CITRATE (PF) 250 MCG/5ML IJ SOLN
INTRAMUSCULAR | Status: AC
  Filled 2017-10-28: qty 5

## 2017-10-28 MED ORDER — ROCURONIUM BROMIDE 100 MG/10ML IV SOLN
INTRAVENOUS | Status: DC | PRN
  Administered 2017-10-28: 20 mg via INTRAVENOUS
  Administered 2017-10-28: 50 mg via INTRAVENOUS

## 2017-10-28 MED ORDER — PROPOFOL 10 MG/ML IV BOLUS
INTRAVENOUS | Status: AC
  Filled 2017-10-28: qty 20

## 2017-10-28 MED ORDER — CEFAZOLIN SODIUM-DEXTROSE 2-3 GM-%(50ML) IV SOLR
INTRAVENOUS | Status: DC | PRN
  Administered 2017-10-28: 2 g via INTRAVENOUS

## 2017-10-28 MED ORDER — MIDAZOLAM HCL 2 MG/2ML IJ SOLN
INTRAMUSCULAR | Status: AC
  Filled 2017-10-28: qty 2

## 2017-10-28 MED ORDER — FENTANYL CITRATE (PF) 100 MCG/2ML IJ SOLN
25.0000 ug | INTRAMUSCULAR | Status: DC | PRN
  Administered 2017-10-28: 25 ug via INTRAVENOUS

## 2017-10-28 MED ORDER — SODIUM CHLORIDE 0.9 % IJ SOLN
INTRAMUSCULAR | Status: AC
  Filled 2017-10-28: qty 50

## 2017-10-28 MED ORDER — METOCLOPRAMIDE HCL 5 MG/ML IJ SOLN
INTRAMUSCULAR | Status: DC | PRN
  Administered 2017-10-28: 10 mg via INTRAVENOUS

## 2017-10-28 MED ORDER — SCOPOLAMINE 1 MG/3DAYS TD PT72
1.0000 | MEDICATED_PATCH | Freq: Once | TRANSDERMAL | Status: DC
  Administered 2017-10-28: 1.5 mg via TRANSDERMAL

## 2017-10-28 MED ORDER — DEXAMETHASONE SODIUM PHOSPHATE 10 MG/ML IJ SOLN
INTRAMUSCULAR | Status: DC | PRN
  Administered 2017-10-28: 10 mg via INTRAVENOUS

## 2017-10-28 MED ORDER — CEFAZOLIN SODIUM-DEXTROSE 2-4 GM/100ML-% IV SOLN
INTRAVENOUS | Status: AC
  Filled 2017-10-28: qty 100

## 2017-10-28 MED ORDER — LACTATED RINGERS IV SOLN
INTRAVENOUS | Status: DC
  Administered 2017-10-28 – 2017-10-29 (×3): via INTRAVENOUS

## 2017-10-28 MED ORDER — HYDROMORPHONE 1 MG/ML IV SOLN
INTRAVENOUS | Status: DC
  Administered 2017-10-28: 30 mg via INTRAVENOUS
  Administered 2017-10-29: 0.4 mg via INTRAVENOUS
  Filled 2017-10-28: qty 30

## 2017-10-28 MED ORDER — PROPOFOL 10 MG/ML IV BOLUS
INTRAVENOUS | Status: DC | PRN
  Administered 2017-10-28: 150 mg via INTRAVENOUS

## 2017-10-28 MED ORDER — PROMETHAZINE HCL 25 MG/ML IJ SOLN
6.2500 mg | INTRAMUSCULAR | Status: DC | PRN
  Administered 2017-10-28: 6.25 mg via INTRAVENOUS

## 2017-10-28 MED ORDER — ROCURONIUM BROMIDE 100 MG/10ML IV SOLN
INTRAVENOUS | Status: AC
  Filled 2017-10-28: qty 1

## 2017-10-28 MED ORDER — VASOPRESSIN 20 UNIT/ML IV SOLN
INTRAVENOUS | Status: AC
  Filled 2017-10-28: qty 1

## 2017-10-28 MED ORDER — LIDOCAINE HCL (CARDIAC) PF 100 MG/5ML IV SOSY
PREFILLED_SYRINGE | INTRAVENOUS | Status: AC
  Filled 2017-10-28: qty 5

## 2017-10-28 MED ORDER — DIPHENHYDRAMINE HCL 50 MG/ML IJ SOLN: 12.5000 mg | Freq: Four times a day (QID) | INTRAMUSCULAR | Status: DC | PRN

## 2017-10-28 MED ORDER — LIDOCAINE HCL (CARDIAC) PF 100 MG/5ML IV SOSY
PREFILLED_SYRINGE | INTRAVENOUS | Status: DC | PRN
  Administered 2017-10-28: 70 mg via INTRAVENOUS

## 2017-10-28 MED ORDER — SUGAMMADEX SODIUM 200 MG/2ML IV SOLN
INTRAVENOUS | Status: AC
  Filled 2017-10-28: qty 2

## 2017-10-28 MED ORDER — ONDANSETRON HCL 4 MG/2ML IJ SOLN
INTRAMUSCULAR | Status: DC | PRN
  Administered 2017-10-28: 4 mg via INTRAVENOUS

## 2017-10-28 MED ORDER — GLYCOPYRROLATE 0.2 MG/ML IJ SOLN
INTRAMUSCULAR | Status: DC | PRN
  Administered 2017-10-28 (×2): 0.1 mg via INTRAVENOUS

## 2017-10-28 MED ORDER — METOPROLOL SUCCINATE ER 25 MG PO TB24
25.0000 mg | ORAL_TABLET | Freq: Every day | ORAL | Status: DC
  Administered 2017-10-28 – 2017-10-30 (×3): 25 mg via ORAL
  Filled 2017-10-28 (×4): qty 1

## 2017-10-28 MED ORDER — BUPIVACAINE HCL (PF) 0.25 % IJ SOLN
INTRAMUSCULAR | Status: AC
  Filled 2017-10-28: qty 30

## 2017-10-28 MED ORDER — FENTANYL CITRATE (PF) 100 MCG/2ML IJ SOLN
INTRAMUSCULAR | Status: AC
  Filled 2017-10-28: qty 2

## 2017-10-28 MED ORDER — ONDANSETRON HCL 4 MG/2ML IJ SOLN
INTRAMUSCULAR | Status: AC
  Filled 2017-10-28: qty 2

## 2017-10-28 MED ORDER — PROMETHAZINE HCL 25 MG/ML IJ SOLN
INTRAMUSCULAR | Status: AC
  Filled 2017-10-28: qty 1

## 2017-10-28 MED ORDER — SCOPOLAMINE 1 MG/3DAYS TD PT72
MEDICATED_PATCH | TRANSDERMAL | Status: AC
  Administered 2017-10-28: 1.5 mg via TRANSDERMAL
  Filled 2017-10-28: qty 1

## 2017-10-28 MED ORDER — DEXAMETHASONE SODIUM PHOSPHATE 4 MG/ML IJ SOLN
INTRAMUSCULAR | Status: AC
  Filled 2017-10-28: qty 1

## 2017-10-28 MED ORDER — HYDROMORPHONE HCL 1 MG/ML IJ SOLN
INTRAMUSCULAR | Status: AC
  Filled 2017-10-28: qty 1

## 2017-10-28 MED ORDER — NALOXONE HCL 0.4 MG/ML IJ SOLN: 0.4000 mg | INTRAMUSCULAR | Status: DC | PRN

## 2017-10-28 MED ORDER — ONDANSETRON HCL 4 MG PO TABS: 4.0000 mg | ORAL_TABLET | Freq: Four times a day (QID) | ORAL | Status: DC | PRN

## 2017-10-28 MED ORDER — FENTANYL CITRATE (PF) 100 MCG/2ML IJ SOLN
INTRAMUSCULAR | Status: DC | PRN
  Administered 2017-10-28: 50 ug via INTRAVENOUS
  Administered 2017-10-28: 100 ug via INTRAVENOUS
  Administered 2017-10-28 (×2): 50 ug via INTRAVENOUS

## 2017-10-28 MED ORDER — BUPIVACAINE HCL (PF) 0.25 % IJ SOLN
INTRAMUSCULAR | Status: DC | PRN
  Administered 2017-10-28: 30 mL

## 2017-10-28 SURGICAL SUPPLY — 33 items
CANISTER SUCT 3000ML PPV (MISCELLANEOUS) ×2 IMPLANT
CONT PATH 16OZ SNAP LID 3702 (MISCELLANEOUS) ×2 IMPLANT
DRAPE CESAREAN BIRTH W POUCH (DRAPES) ×2 IMPLANT
DRAPE WARM FLUID 44X44 (DRAPE) IMPLANT
DRSG OPSITE POSTOP 4X10 (GAUZE/BANDAGES/DRESSINGS) ×2 IMPLANT
DURAPREP 26ML APPLICATOR (WOUND CARE) ×2 IMPLANT
GAUZE 4X4 16PLY RFD (DISPOSABLE) IMPLANT
GAUZE SPONGE 4X4 12PLY STRL LF (GAUZE/BANDAGES/DRESSINGS) ×2 IMPLANT
GLOVE BIO SURGEON STRL SZ7 (GLOVE) ×2 IMPLANT
GLOVE BIOGEL PI IND STRL 7.0 (GLOVE) ×3 IMPLANT
GLOVE BIOGEL PI INDICATOR 7.0 (GLOVE) ×3
GOWN STRL REUS W/TWL LRG LVL3 (GOWN DISPOSABLE) ×6 IMPLANT
LIGASURE IMPACT 36 18CM CVD LR (INSTRUMENTS) ×2 IMPLANT
NEEDLE HYPO 22GX1.5 SAFETY (NEEDLE) ×2 IMPLANT
NS IRRIG 1000ML POUR BTL (IV SOLUTION) ×2 IMPLANT
PACK ABDOMINAL GYN (CUSTOM PROCEDURE TRAY) ×2 IMPLANT
PAD ABD 8X7 1/2 STERILE (GAUZE/BANDAGES/DRESSINGS) ×2 IMPLANT
PAD OB MATERNITY 4.3X12.25 (PERSONAL CARE ITEMS) ×2 IMPLANT
PROTECTOR NERVE ULNAR (MISCELLANEOUS) ×2 IMPLANT
SPONGE LAP 18X18 X RAY DECT (DISPOSABLE) ×4 IMPLANT
STAPLER VISISTAT 35W (STAPLE) IMPLANT
SUT PLAIN 2 0 XLH (SUTURE) IMPLANT
SUT PROLENE 0 CT 1 30 (SUTURE) IMPLANT
SUT VIC AB 0 CT1 18XCR BRD8 (SUTURE) ×3 IMPLANT
SUT VIC AB 0 CT1 36 (SUTURE) ×8 IMPLANT
SUT VIC AB 0 CT1 8-18 (SUTURE) ×3
SUT VIC AB 4-0 KS 27 (SUTURE) IMPLANT
SUT VIC AB 4-0 SH 18 (SUTURE) IMPLANT
SUT VICRYL 0 TIES 12 18 (SUTURE) ×2 IMPLANT
SYR CONTROL 10ML LL (SYRINGE) ×2 IMPLANT
TAPE HYPAFIX 4 X10 (GAUZE/BANDAGES/DRESSINGS) ×2 IMPLANT
TOWEL OR 17X24 6PK STRL BLUE (TOWEL DISPOSABLE) ×4 IMPLANT
TRAY FOLEY W/BAG SLVR 14FR (SET/KITS/TRAYS/PACK) ×2 IMPLANT

## 2017-10-28 NOTE — Anesthesia Procedure Notes (Signed)
Procedure Name: Intubation Date/Time: 10/28/2017 1:42 PM Performed by: Rayvon Char, CRNA Pre-anesthesia Checklist: Patient identified, Emergency Drugs available, Suction available and Patient being monitored Patient Re-evaluated:Patient Re-evaluated prior to induction Oxygen Delivery Method: Circle system utilized Preoxygenation: Pre-oxygenation with 100% oxygen Induction Type: IV induction Ventilation: Mask ventilation without difficulty Laryngoscope Size: Miller and 2 Grade View: Grade II Tube type: Oral Tube size: 7.0 mm Number of attempts: 1 Airway Equipment and Method: Stylet Secured at: 22 cm Tube secured with: Tape Dental Injury: Teeth and Oropharynx as per pre-operative assessment

## 2017-10-28 NOTE — Anesthesia Postprocedure Evaluation (Signed)
Anesthesia Post Note  Patient: Sara Hunt  Procedure(s) Performed: HYSTERECTOMY ABDOMINAL WITH SALPINGECTOMY (Bilateral Abdomen)     Patient location during evaluation: PACU Anesthesia Type: General Level of consciousness: awake and alert Pain management: pain level controlled Vital Signs Assessment: post-procedure vital signs reviewed and stable Respiratory status: spontaneous breathing, nonlabored ventilation, respiratory function stable and patient connected to nasal cannula oxygen Cardiovascular status: blood pressure returned to baseline and stable Postop Assessment: no apparent nausea or vomiting Anesthetic complications: no    Last Vitals:  Vitals:   10/28/17 1730 10/28/17 1745  BP: (!) 166/101 (!) 163/103  Pulse: 91 88  Resp: 19 20  Temp:    SpO2: 100% 96%    Last Pain:  Vitals:   10/28/17 1745  TempSrc:   PainSc: 8    Pain Goal: Patients Stated Pain Goal: 5 (10/28/17 1140)               [place]
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
## 2017-10-28 NOTE — Anesthesia Preprocedure Evaluation (Addendum)
Anesthesia Evaluation  Patient identified by MRN, date of birth, ID band Patient awake    Reviewed: Allergy & Precautions, NPO status , Patient's Chart, lab work & pertinent test results, reviewed documented beta blocker date and time   Airway Mallampati: II  TM Distance: >3 FB Neck ROM: Full    Dental no notable dental hx. (+) Teeth Intact, Dental Advisory Given   Pulmonary former smoker,    Pulmonary exam normal breath sounds clear to auscultation       Cardiovascular hypertension, Pt. on medications and Pt. on home beta blockers Normal cardiovascular exam+ dysrhythmias (s/p ablation) Supra Ventricular Tachycardia  Rhythm:Regular Rate:Normal     Neuro/Psych negative neurological ROS  negative psych ROS   GI/Hepatic Neg liver ROS, GERD  ,  Endo/Other  negative endocrine ROS  Renal/GU negative Renal ROS  negative genitourinary   Musculoskeletal negative musculoskeletal ROS (+)   Abdominal   Peds  Hematology negative hematology ROS (+)   Anesthesia Other Findings Uterine fibroids, menorrhagia  Reproductive/Obstetrics                            Anesthesia Physical Anesthesia Plan  ASA: II  Anesthesia Plan: General   Post-op Pain Management:    Induction: Intravenous  PONV Risk Score and Plan: 3 and Midazolam, Scopolamine patch - Pre-op, Dexamethasone and Ondansetron  Airway Management Planned: Oral ETT  Additional Equipment:   Intra-op Plan:   Post-operative Plan: Extubation in OR  Informed Consent: I have reviewed the patients History and Physical, chart, labs and discussed the procedure including the risks, benefits and alternatives for the proposed anesthesia with the patient or authorized representative who has indicated his/her understanding and acceptance.   Dental advisory given  Plan Discussed with: CRNA  Anesthesia Plan Comments:         Anesthesia Quick  Evaluation

## 2017-10-28 NOTE — Op Note (Signed)
PRE-OPERATIVE DIAGNOSIS:  Uterine Fibroids   POST-OPERATIVE DIAGNOSIS:  Uterine Fibroids ( specimen weight 1080 gm)  PROCEDURE:  TOTAL ABDOMINAL HYSTERECTOMY, BILATERAL SALPINGECTOMY  SURGEON: Elveria Royals, MD  ASSISTANT:  Tiana Loft, MD  ANESTHESIA:  General endotracheal  EBL: 400 cc  IVF: 2300 LR   Urine output: 400 cc clear urine in foley  LOCAL MEDICATIONS USED:  MARCAINE 0.25% 10 cc skin infiltration     SPECIMEN: Uterus with cervix and bilateral fallopian tubes    DISPOSITION OF SPECIMEN:  PATHOLOGY  COUNTS:  YES  PATIENT DISPOSITION:  PACU - hemodynamically stable.    DVT: prophylaxis starting at 8 pm with Lovenox 40 mg daily subcu  PROCEDURE:   Indication:  Symptomatic uterine fibroids with abdominal mass, pain and menorrhagia. Hysterectomy was desired, she didn't want future childbearing. Risks and complications of surgery including infection, bleeding, damage to internal organs and other including but not limited to surgery related problems including pneumonia, VTE reviewed. Informed written consent was obtained.   Patient was brought to the operating room with IV running. She received 2 gm Ancef. Underwent general anesthesia without difficulty and was given dorsal supine position, prepped and draped in sterile fashion. Large pannus was elevated with Tracksee drape. Foley catheter was placed. Exam under anesthesia noted uterus at the umbilicus but mobile. Pfannenstiel incision was made with scalpel and carried down to the underlying fascia with Bovie with excellent hemostasis.  Fascia incised and extended laterally. Fascia grasped with Kocher's and underlying rectus muscles were dissected down. Rectus muscles were separated in midline. Posterior rectus sheath and posterior peritoneum was grasped with mosquitoes and peritoneal entry made. Large fibroid uterus was palpated. No adhesions noted. Upper abdomen felt normal. Patient's abdominal muscles were very tight and  difficult to retract though per CRNA she was well paralyzed. There was no mobility of the uterus once it was elevated through the incision. Ligasure was used due to increased vascularity and limited stretch of the abdominal wall leading to limited exposure. Bilateral tubes and ovaries seen well. Ureters were palpated along the pelvic floor. Bilateral cornual structures clamped with Ligasure and desiccated and cut and broad ligament dissected. Left salpingectomy performed with Ligasure and tube was handed off. Bilateral round ligaments were attenuated but could be palpated digitally and were clamped and desiccated and cut with Ligasure. Anterior broad ligament was dissected by blunt dissection, though visualization was not adequate.  At this point due to difficult with lateral dissection to secure uterine vessels, decision was made to perform myomectomy of her 10 cm posterior myoma to collapse the volume. Dilute vasopressin injected in uterine serosa and myometrium over the myoma. Serosa and myometrium incised and fibroid grasped. Fibroid enucleation began but it could not be completely resected at it was arising from lower segment, so it was brought up on uterine fundus. Now ureters were palpated well. Anterior bladder dissection was done. Laterally uterine vessels skeletonized with Ligasure. Then uterine vessels clamped, dessicated and cut with Ligasure. Uterus and myoma were amputated above uterine pedicle level and handed off. Lower segment stump with cervix grasped with Kochers. Bilateral cardinal ligaments dissected and cut with Ligasure. Then uterosacral ligaments grasped with curved Heaney and cut and suture transfixed. Vaginal angles clamped and cut and transfixed. Vagina cut circumferentially and cervix and lower segment stump handed off to pathology. Vaginal angles and cut edges closed with 0-Vicryl in interrupted fashion. Extra figure of 8 stitch needed to secure bleeding from posterior vaginal edge.  Left ovarian pedicle and  ovary was hemostasis. Right ovary/ tube grasped and left salpingectomy done with Ligasure and specimen handed off. Vaginal cuff reassess and all pedicles assessed to confirm hemostasis. Lap sponge used for packing removed. Abdomen closed. Peritoneal edges grasped and peritoneum closed with 2-0 Vicryl. Fascia sutured with 0 Vicryl from two ends and met in midline. Subcutaneous layer was deep and closed with 2-0 Plain gut. Skin approximated with 4-0 Vicryl in subcuticular fashion.  Sterile dressing placed.  Sterile Honeycomb dressing placed.  All  Instruments/ lap/ sponges counts were correct x2. No complications. Patient brought to PACU after extubation in stable condition.   Dr Benjie Karvonen was the surgeon for entire case.   Manon Hilding, MD

## 2017-10-28 NOTE — Transfer of Care (Signed)
Immediate Anesthesia Transfer of Care Note  Patient: Sara Hunt  Procedure(s) Performed: HYSTERECTOMY ABDOMINAL WITH SALPINGECTOMY (Bilateral Abdomen)  Patient Location: PACU  Anesthesia Type:General  Level of Consciousness: awake, alert  and oriented  Airway & Oxygen Therapy: Patient Spontanous Breathing and Patient connected to nasal cannula oxygen  Post-op Assessment: Report given to RN and Post -op Vital signs reviewed and stable  Post vital signs: Reviewed and stable  Last Vitals:  Vitals Value Taken Time  BP    Temp    Pulse 84 10/28/2017  5:04 PM  Resp 19 10/28/2017  5:04 PM  SpO2 100 % 10/28/2017  5:04 PM  Vitals shown include unvalidated device data.  Last Pain:  Vitals:   10/28/17 1140  TempSrc: Oral  PainSc: 0-No pain      Patients Stated Pain Goal: 5 (94/32/00 3794)  Complications: No apparent anesthesia complications

## 2017-10-29 ENCOUNTER — Encounter (HOSPITAL_COMMUNITY): Payer: Self-pay | Admitting: Obstetrics & Gynecology

## 2017-10-29 LAB — CREATININE, SERUM: Creatinine, Ser: 0.82 mg/dL (ref 0.44–1.00)

## 2017-10-29 LAB — CBC
HEMATOCRIT: 32.6 % — AB (ref 36.0–46.0)
HEMOGLOBIN: 11 g/dL — AB (ref 12.0–15.0)
MCH: 31.7 pg (ref 26.0–34.0)
MCHC: 33.7 g/dL (ref 30.0–36.0)
MCV: 93.9 fL (ref 80.0–100.0)
NRBC: 0 % (ref 0.0–0.2)
Platelets: 224 10*3/uL (ref 150–400)
RBC: 3.47 MIL/uL — ABNORMAL LOW (ref 3.87–5.11)
RDW: 12.4 % (ref 11.5–15.5)
WBC: 9.3 10*3/uL (ref 4.0–10.5)

## 2017-10-29 MED ORDER — ACETAMINOPHEN 325 MG PO TABS: 650.0000 mg | ORAL_TABLET | Freq: Four times a day (QID) | ORAL | Status: DC | PRN

## 2017-10-29 MED ORDER — IBUPROFEN 600 MG PO TABS
600.0000 mg | ORAL_TABLET | Freq: Four times a day (QID) | ORAL | Status: DC | PRN
  Administered 2017-10-30 (×2): 600 mg via ORAL
  Filled 2017-10-29 (×2): qty 1

## 2017-10-29 MED ORDER — OXYCODONE HCL 5 MG PO TABS
5.0000 mg | ORAL_TABLET | Freq: Four times a day (QID) | ORAL | Status: DC | PRN
  Administered 2017-10-29: 5 mg via ORAL
  Filled 2017-10-29: qty 1

## 2017-10-29 NOTE — Anesthesia Postprocedure Evaluation (Signed)
Anesthesia Post Note  Patient: Sara Hunt  Procedure(s) Performed: HYSTERECTOMY ABDOMINAL WITH SALPINGECTOMY (Bilateral Abdomen)     Patient location during evaluation: [facility] Unit Anesthesia Type: General Level of consciousness: awake, awake and alert and oriented Pain management: pain level controlled Vital Signs Assessment: post-procedure vital signs reviewed and stable Respiratory status: spontaneous breathing, nonlabored ventilation and respiratory function stable Cardiovascular status: stable Postop Assessment: no headache, no backache, no apparent nausea or vomiting, adequate PO intake, able to ambulate and patient able to bend at knees Anesthetic complications: no    Last Vitals:  Vitals:   10/29/17 0500 10/29/17 0557  BP: (!) 149/71   Pulse: 62   Resp: 17 18  Temp: 36.7 C   SpO2: 99% 99%    Last Pain:  Vitals:   10/29/17 0557  TempSrc:   PainSc: 4    Pain Goal: Patients Stated Pain Goal: 3 (10/29/17 0557)               Marcy Siren

## 2017-10-29 NOTE — Progress Notes (Signed)
Patient ID: Sara Hunt, female   DOB: 1975-08-23, 42 y.o.   MRN: [id number]  Subjective: Pain is controlled well. PCA discontinued. Cant ambulate but got out of bed to wheelchair and to the bathroom. Foley out and has voided once.  Tolerated general diet and fluid. No vaginal bleeding/ SOB/ CP / N/V   Objective: Vital signs in last 24 hours: Temp:  [97.9 F (36.6 C)-98.7 F (37.1 C)] 97.9 F (36.6 C) (10/25 1655) Pulse Rate:  [51-91] 70 (10/25 1655) Resp:  [10-21] 18 (10/25 1655) BP: (137-170)/(63-103) 149/89 (10/25 1655) SpO2:  [46 %-100 %] 100 % (10/25 1655) Weight:  [109.4 kg] 109.4 kg (10/24 1840) Weight change:  Last BM Date: 10/28/17  Intake/Output from previous day: 10/24 0701 - 10/25 0700 In: 3931.2 [P.O.:150; I.V.:3781.2] Out: 2975 [Urine:2575; Blood:400] Intake/Output this shift: Total I/O In: 980 [P.O.:480; I.V.:500] Out: 500 [Urine:500]  General appearance: alert and cooperative Resp: clear to auscultation bilaterally Cardio: regular rate and rhythm, S1, S2 normal, no murmur, click, rub or gallop GI: normal findings: bowel sounds normal Extremities: Homans sign is negative, no sign of DVT Incision/Wound: Wound with dressing, dry and no erythema. N No vaginal bleeding reported  CBC Latest Ref Rng & Units 10/29/2017 10/20/2017 04/12/2017  WBC 4.0 - 10.5 K/uL 9.3 6.9 7.1  Hemoglobin 12.0 - 15.0 g/dL 11.0(L) 12.2 12.2  Hematocrit 36.0 - 46.0 % 32.6(L) 37.5 36.6  Platelets 150 - 400 K/uL 224 244 226.0   BMET Recent Labs    10/29/17 0456  CREATININE 0.82    Assessment/Plan: POD#1 TAH/ bilateral salpingectomy for fibroid uterus.  Minimal mobility and with BMI meet criteria for perioperative thromboprophylaxis with Lovenox 40 units [place] daily Encourage to move a bit more as she does at home.  PO pain meds Stable, doing well    LOS: 1 day   Sara Hunt 10/29/2017, 5:11 PM

## 2017-10-30 MED ORDER — BISACODYL 10 MG RE SUPP: 10.0000 mg | Freq: Once | RECTAL | Status: DC | PRN

## 2017-10-30 MED ORDER — SIMETHICONE 80 MG PO CHEW
80.0000 mg | CHEWABLE_TABLET | Freq: Two times a day (BID) | ORAL | Status: DC | PRN
  Administered 2017-10-30 – 2017-10-31 (×2): 80 mg via ORAL
  Filled 2017-10-30 (×2): qty 1

## 2017-10-30 MED ORDER — MAGNESIUM HYDROXIDE 400 MG/5ML PO SUSP
15.0000 mL | Freq: Every day | ORAL | Status: DC
  Administered 2017-10-30 – 2017-10-31 (×2): 15 mL via ORAL
  Filled 2017-10-30 (×2): qty 30

## 2017-10-30 MED ORDER — DOCUSATE SODIUM 100 MG PO CAPS
100.0000 mg | ORAL_CAPSULE | Freq: Two times a day (BID) | ORAL | Status: DC
  Administered 2017-10-30 – 2017-10-31 (×2): 100 mg via ORAL
  Filled 2017-10-30 (×2): qty 1
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
## 2017-10-30 NOTE — Plan of Care (Signed)
Pts condition will continue to improve 

## 2017-10-31 MED ORDER — DOCUSATE SODIUM 100 MG PO CAPS: 100.0000 mg | ORAL_CAPSULE | Freq: Two times a day (BID) | ORAL | 0 refills | Status: DC

## 2017-10-31 MED ORDER — IBUPROFEN 600 MG PO TABS: 600.0000 mg | ORAL_TABLET | Freq: Four times a day (QID) | ORAL | 0 refills | Status: DC | PRN

## 2017-10-31 MED ORDER — OXYCODONE HCL 5 MG PO TABS: 5.0000 mg | ORAL_TABLET | Freq: Four times a day (QID) | ORAL | 0 refills | Status: DC | PRN

## 2017-10-31 MED ORDER — ACETAMINOPHEN 325 MG PO TABS: 650.0000 mg | ORAL_TABLET | Freq: Four times a day (QID) | ORAL | 0 refills | Status: DC | PRN

## 2017-10-31 MED ORDER — ENOXAPARIN SODIUM 40 MG/0.4ML ~~LOC~~ SOLN: 40.0000 mg | SUBCUTANEOUS | 0 refills | Status: DC

## 2017-10-31 MED ORDER — SIMETHICONE 80 MG PO CHEW: 80.0000 mg | CHEWABLE_TABLET | Freq: Two times a day (BID) | ORAL | 0 refills | Status: DC | PRN

## 2017-10-31 NOTE — Progress Notes (Signed)
Discharge instructions and prescriptions given to pt.  Discussed post-op care, signs and symptoms to report to the MD, upcoming appointments, and meds. Pt vervalizes understanding and has no questions or concerns at this time.

## 2017-10-31 NOTE — Discharge Summary (Signed)
Physician Discharge Summary  Patient ID: EILEE SCHADER MRN: [id number] DOB/AGE: Jun 11, 1975 42 y.o.  Admit date: 10/28/2017 Discharge date: 10/31/2017  Admission Diagnoses: Uterine fibroids   Discharge Diagnoses:  Active Problems:   Fibroid, uterine   S/P TAH (total abdominal hysterectomy)   Discharged Condition: good  Hospital Course: Patient had abdominal hysterectomy under general anesthesia. Surgery was uncomplicated. She was started on Lovenox 40 u daily 6 hrs post-op and will continue for 10 days. Vital signs stable, ambulating some (paraparesis), voiding, tolerating regular diet.  Pain well controlled.   Discharge Exam: Blood pressure (!) 141/93, pulse 76, temperature 98.3 F (36.8 C), temperature source Oral, resp. rate 16, height 5\' 6"  (1.676 m), weight 109.4 kg, last menstrual period 10/20/2017, SpO2 100 %, unknown if currently breastfeeding. General appearance: alert and cooperative Resp: clear to auscultation bilaterally Cardio: regular rate and rhythm, S1, S2 normal, no murmur, click, rub or gallop Extremities: Homans sign is negative, no sign of DVT Incision/Wound: intact no erythema or hematoma   Disposition: Discharge disposition: 01-Home or Self Care       Discharge Instructions    Call MD for:   Complete by:  As directed    Heavy vaginal bleeding   Call MD for:  difficulty breathing, headache or visual disturbances   Complete by:  As directed    Call MD for:  extreme fatigue   Complete by:  As directed    Call MD for:  hives   Complete by:  As directed    Call MD for:  persistant dizziness or light-headedness   Complete by:  As directed    Call MD for:  persistant nausea and vomiting   Complete by:  As directed    Call MD for:  redness, tenderness, or signs of infection (pain, swelling, redness, odor or green/yellow discharge around incision site)   Complete by:  As directed    Call MD for:  severe uncontrolled pain   Complete by:  As directed    Call MD for:  temperature >100.4   Complete by:  As directed    Diet - low sodium heart healthy   Complete by:  As directed    Driving Restrictions   Complete by:  As directed    2 weeks   Increase activity slowly   Complete by:  As directed    Lifting restrictions   Complete by:  As directed    Less than 20 lbs for 6 weeks   Sexual Activity Restrictions   Complete by:  As directed    None for 6 weeks     Allergies as of 10/31/2017      Reactions   Latex Rash      Medication List    TAKE these medications   acetaminophen 325 MG tablet Commonly known as:  TYLENOL Take 2 tablets (650 mg total) by mouth every 6 (six) hours as needed for mild pain.   BENGAY EX Apply 1 application topically daily as needed (muscle pain).   docusate sodium 100 MG capsule Commonly known as:  COLACE Take 1 capsule (100 mg total) by mouth 2 (two) times daily.   enoxaparin 40 MG/0.4ML injection Commonly known as:  LOVENOX Inject 0.4 mLs (40 mg total) into the skin daily for 7 days.   ibuprofen 600 MG tablet Commonly known as:  ADVIL,MOTRIN Take 1 tablet (600 mg total) by mouth every 6 (six) hours as needed for moderate pain. What changed:    medication strength  how much to take  when to take this  reasons to take this   metoprolol succinate 25 MG 24 hr tablet Commonly known as:  TOPROL-XL Take 1 tablet (25 mg total) by mouth daily. What changed:  when to take this   multivitamin with minerals Tabs tablet Take 1 tablet by mouth daily.   oxyCODONE 5 MG immediate release tablet Commonly known as:  Oxy IR/ROXICODONE Take 1 tablet (5 mg total) by mouth every 6 (six) hours as needed for severe pain.   rosuvastatin 10 MG tablet Commonly known as:  CRESTOR Take 1 tablet (10 mg total) by mouth daily. Needs annual exam with labs for further refills   simethicone 80 MG chewable tablet Commonly known as:  MYLICON Chew 1 tablet (80 mg total) by mouth 2 (two) times daily as needed for  flatulence.      F/up Dr Benjie Karvonen 2-3 weeks.  Post-op care reviewed.    SignedElveria Royals 10/31/2017, 10:36 AM

## 2017-10-31 NOTE — Discharge Instructions (Signed)
Abdominal Hysterectomy, Care After °This sheet gives you information about how to care for yourself after your procedure. Your doctor may also give you more specific instructions. If you have problems or questions, contact your doctor. °Follow these instructions at home: °Bathing °· Do not take baths, swim, or use a hot tub until your doctor says it is okay. Ask your doctor if you can take showers. You may only be allowed to take sponge baths for bathing. °· Keep the bandage (dressing) dry until your doctor says it can be taken off. °Surgical cut ( °incision) care °· Follow instructions from your doctor about how to take care of your cut from surgery. Make sure you: °? Wash your hands with soap and water before you change your bandage (dressing). If you cannot use soap and water, use hand sanitizer. °? Change your bandage as told by your doctor. °? Leave stitches (sutures), skin glue, or skin tape (adhesive) strips in place. They may need to stay in place for 2 weeks or longer. If tape strips get loose and curl up, you may trim the loose edges. Do not remove tape strips completely unless your doctor says it is okay. °· Check your surgical cut area every day for signs of infection. Check for: °? Redness, swelling, or pain. °? Fluid or blood. °? Warmth. °? Pus or a bad smell. °Activity °· Do gentle, daily exercise as told by your doctor. You may be told to take short walks every day and go farther each time. °· Do not lift anything that is heavier than 10 lb (4.5 kg), or the limit that your doctor tells you, until he or she says that it is safe. °· Do not drive or use heavy machinery while taking prescription pain medicine. °· Do not drive for 24 hours if you were given a medicine to help you relax (sedative). °· Follow your doctor's advice about exercise, driving, and general activities. Ask your doctor what activities are safe for you. °Lifestyle °· Do not douche, use tampons, or have sex for at least 6 weeks or as  told by your doctor. °· Do not drink alcohol until your doctor says it is okay. °· Drink enough fluid to keep your pee (urine) clear or pale yellow. °· Try to have someone at home with you for the first 1-2 weeks to help. °· Do not use any products that contain nicotine or tobacco, such as cigarettes and e-cigarettes. These can slow down healing. If you need help quitting, ask your doctor. °General instructions °· Take over-the-counter and prescription medicines only as told by your doctor. °· Do not take aspirin or ibuprofen. These medicines can cause bleeding. °· To prevent or treat constipation while you are taking prescription pain medicine, your doctor may suggest that you: °? Drink enough fluid to keep your urine clear or pale yellow. °? Take over-the-counter or prescription medicines. °? Eat foods that are high in fiber, such as: °§ Fresh fruits and vegetables. °§ Whole grains. °§ Beans. °? Limit foods that are high in fat and processed sugars, such as fried and sweet foods. °· Keep all follow-up visits as told by your doctor. This is important. °Contact a doctor if: °· You have chills or fever. °· You have redness, swelling, or pain around your cut. °· You have fluid or blood coming from your cut. °· Your cut feels warm to the touch. °· You have pus or a bad smell coming from your cut. °· Your cut breaks   open. °· You feel dizzy or light-headed. °· You have pain or bleeding when you pee. °· You keep having watery poop (diarrhea). °· You keep feeling sick to your stomach (nauseous) or keep throwing up (vomiting). °· You have unusual fluid (discharge) coming from your vagina. °· You have a rash. °· You have a reaction to your medicine. °· Your pain medicine does not help. °Get help right away if: °· You have a fever and your symptoms get worse all of a sudden. °· You have very bad belly (abdominal) pain. °· You are short of breath. °· You pass out (faint). °· You have pain, swelling, or redness of your  leg. °· You bleed a lot from your vagina and notice clumps of blood (clots). °Summary °· Do not take baths, swim, or use a hot tub until your doctor says it is okay. Ask your doctor if you can take showers. You may only be allowed to take sponge baths for bathing. °· Follow your doctor's advice about exercise, driving, and general activities. Ask your doctor what activities are safe for you. °· Do not lift anything that is heavier than 10 lb (4.5 kg), or the limit that your doctor tells you, until he or she says that it is safe. °· Try to have someone at home with you for the first 1-2 weeks to help. °This information is not intended to replace advice given to you by your health care provider. Make sure you discuss any questions you have with your health care provider. °Document Released: 10/01/2007 Document Revised: 12/11/2015 Document Reviewed: 12/11/2015 °Elsevier Interactive Patient Education © 2017 [employer] ° °

## 2017-10-31 NOTE — Progress Notes (Signed)
Patient ID: Sara Hunt, female   DOB: 1975/07/26, 42 y.o.   MRN: [id number]  Subjective: Flatus + nausea better. No vomiting.  No n/v, tolerating regular diet, voiding.   Objective: Vital signs in last 24 hours: Temp:  [97.9 F (36.6 C)-98.9 F (37.2 C)] 98.3 F (36.8 C) (10/27 0846) Pulse Rate:  [61-81] 76 (10/27 0846) Resp:  [16-18] 16 (10/27 0846) BP: (120-145)/(61-93) 141/93 (10/27 0846) SpO2:  [98 %-100 %] 100 % (10/27 0846) Weight change:  Last BM Date: 10/28/17  General appearance: alert and cooperative Resp: clear to auscultation bilaterally Cardio: regular rate and rhythm, S1, S2 normal, no murmur, click, rub or gallop GI: normal findings: bowel sounds normal Extremities: Homans sign is negative, no sign of DVT Incision/Wound: Wound with dressing, dry and no erythema. N Honeycomb dressing with old red dry blood, no active bleeding. No erythema/ hematoma in incision   No new labs  Assessment/Plan: POD# 3  TAH/ bilateral salpingectomy for fibroid uterus. Wheelchair bound female.  Continue perioperative thromboprophylaxis with Lovenox 40 units [place] daily for 7 days post-op Encourage to move a bit more as she does at home.  PO pain meds, Stable, doing well Cont Milk of mag for BM and add Miralax at home D/C home, mother and sister will be at home with her for post-op care    LOS: 3 days   Elveria Royals 10/31/2017, 10:25 AM

## 2017-10-31 NOTE — Plan of Care (Signed)
  Problem: Health Behavior/Discharge Planning: Goal: Ability to manage health-related needs will improve Outcome: Progressing   Problem: Activity: Goal: Risk for activity intolerance will decrease Outcome: Progressing   Problem: Elimination: Goal: Will not experience complications related to bowel motility Outcome: Progressing   Problem: Safety: Goal: Ability to remain free from injury will improve Outcome: Progressing   Problem: Skin Integrity: Goal: Risk for impaired skin integrity will decrease Outcome: Progressing   Problem: Skin Integrity: Goal: Demonstration of wound healing without infection will improve Outcome: Progressing

## 2017-11-02 DIAGNOSIS — K5900 Constipation, unspecified: Secondary | ICD-10-CM | POA: Diagnosis not present

## 2017-11-10 DIAGNOSIS — T8131XA Disruption of external operation (surgical) wound, not elsewhere classified, initial encounter: Secondary | ICD-10-CM | POA: Diagnosis not present

## 2017-11-28 ENCOUNTER — Encounter (HOSPITAL_COMMUNITY): Payer: Self-pay | Admitting: *Deleted

## 2017-11-28 ENCOUNTER — Emergency Department (HOSPITAL_COMMUNITY)
Admission: EM | Admit: 2017-11-28 | Discharge: 2017-11-28 | Disposition: A | Discharge status: Home / Self Care | Payer: 59 | Attending: Emergency Medicine | Admitting: Emergency Medicine

## 2017-11-28 DIAGNOSIS — Z87891 Personal history of nicotine dependence: Secondary | ICD-10-CM | POA: Insufficient documentation

## 2017-11-28 DIAGNOSIS — B373 Candidiasis of vulva and vagina: Secondary | ICD-10-CM | POA: Diagnosis not present

## 2017-11-28 DIAGNOSIS — Z9889 Other specified postprocedural states: Secondary | ICD-10-CM | POA: Diagnosis not present

## 2017-11-28 DIAGNOSIS — R102 Pelvic and perineal pain: Secondary | ICD-10-CM | POA: Diagnosis present

## 2017-11-28 DIAGNOSIS — Z90710 Acquired absence of both cervix and uterus: Secondary | ICD-10-CM | POA: Diagnosis not present

## 2017-11-28 DIAGNOSIS — B3731 Acute candidiasis of vulva and vagina: Secondary | ICD-10-CM

## 2017-11-28 DIAGNOSIS — I10 Essential (primary) hypertension: Secondary | ICD-10-CM | POA: Insufficient documentation

## 2017-11-28 DIAGNOSIS — Z79899 Other long term (current) drug therapy: Secondary | ICD-10-CM | POA: Insufficient documentation

## 2017-11-28 LAB — WET PREP, GENITAL
CLUE CELLS WET PREP: NONE SEEN
Sperm: NONE SEEN
Trich, Wet Prep: NONE SEEN
Yeast Wet Prep HPF POC: NONE SEEN

## 2017-11-28 LAB — URINALYSIS, ROUTINE W REFLEX MICROSCOPIC
Bacteria, UA: NONE SEEN
Bilirubin Urine: NEGATIVE
GLUCOSE, UA: NEGATIVE mg/dL
Ketones, ur: NEGATIVE mg/dL
Leukocytes, UA: NEGATIVE
NITRITE: NEGATIVE
PH: 6 (ref 5.0–8.0)
Protein, ur: NEGATIVE mg/dL
Specific Gravity, Urine: 1.016 (ref 1.005–1.030)

## 2017-11-28 MED ORDER — KETOCONAZOLE 2 % EX CREA: 1.0000 "application " | TOPICAL_CREAM | Freq: Every day | CUTANEOUS | 0 refills | Status: AC

## 2017-11-28 NOTE — Discharge Instructions (Signed)
Follow these instructions at home: Take or apply over-the-counter and prescription medicines only as told by your health care provider. Do not have sex until your health care provider has approved. Tell your sex partner that you have a yeast infection. That person should go to his or her health care provider if he or she develops symptoms. Do not wear tight clothes, such as pantyhose or tight pants. Avoid using tampons until your health care provider approves. Eat more yogurt. This may help to keep your yeast infection from returning. Try taking a sitz bath to help with discomfort. This is a warm water bath that is taken while you are sitting down. The water should only come up to your hips and should cover your buttocks. Do this 3-4 times per day or as told by your health care provider. Do not douche. Wear breathable, cotton underwear. If you have diabetes, keep your blood sugar levels under control. Contact a health care provider if: You have a fever. Your symptoms go away and then return. Your symptoms do not get better with treatment. Your symptoms get worse. You have new symptoms. You develop blisters in or around your vagina. You have blood coming from your vagina and it is not your menstrual period. You develop pain in your abdomen.

## 2017-11-28 NOTE — ED Notes (Signed)
Urine and culture sent to lab  

## 2017-11-28 NOTE — ED Triage Notes (Signed)
Pt reports vaginal burning and swelling since using soap to clean her vagina a few days ago. Pt took diflucan yesterday but states it did not improve. Pt tried amoxicillin, which she states seems to have helped. Pt denies bleeding or abnormal discharge. Pt had hysterectomy 4 weeks ago.
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
## 2018-01-06 ENCOUNTER — Other Ambulatory Visit (INDEPENDENT_AMBULATORY_CARE_PROVIDER_SITE_OTHER): Payer: 59

## 2018-01-06 ENCOUNTER — Encounter: Payer: Self-pay | Admitting: Internal Medicine

## 2018-01-06 ENCOUNTER — Ambulatory Visit (INDEPENDENT_AMBULATORY_CARE_PROVIDER_SITE_OTHER): Payer: 59 | Admitting: Internal Medicine

## 2018-01-06 VITALS — BP 140/90 | HR 67 | Temp 97.9°F | Ht 66.0 in | Wt 241.0 lb

## 2018-01-06 DIAGNOSIS — E785 Hyperlipidemia, unspecified: Secondary | ICD-10-CM

## 2018-01-06 DIAGNOSIS — I471 Supraventricular tachycardia: Secondary | ICD-10-CM

## 2018-01-06 DIAGNOSIS — R5383 Other fatigue: Secondary | ICD-10-CM

## 2018-01-06 DIAGNOSIS — I10 Essential (primary) hypertension: Secondary | ICD-10-CM

## 2018-01-06 LAB — LIPID PANEL
Cholesterol: 264 mg/dL — ABNORMAL HIGH (ref 0–200)
HDL: 37.8 mg/dL — ABNORMAL LOW (ref >39.00)
LDL Cholesterol: 200 mg/dL — ABNORMAL HIGH (ref 0–99)
NONHDL: 226.29
Total CHOL/HDL Ratio: 7
Triglycerides: 130 mg/dL (ref 0.0–149.0)
VLDL: 26 mg/dL (ref 0.0–40.0)

## 2018-01-06 LAB — COMPREHENSIVE METABOLIC PANEL
ALK PHOS: 61 U/L (ref 39–117)
ALT: 10 U/L (ref 0–35)
AST: 13 U/L (ref 0–37)
Albumin: 3.7 g/dL (ref 3.5–5.2)
BUN: 10 mg/dL (ref 6–23)
CO2: 28 mEq/L (ref 19–32)
Calcium: 9.4 mg/dL (ref 8.4–10.5)
Chloride: 101 mEq/L (ref 96–112)
Creatinine, Ser: 0.74 mg/dL (ref 0.40–1.20)
GFR: 110.8 mL/min (ref >60.00)
GLUCOSE: 80 mg/dL (ref 70–99)
Potassium: 3.8 mEq/L (ref 3.5–5.1)
Sodium: 135 mEq/L (ref 135–145)
TOTAL PROTEIN: 7.9 g/dL (ref 6.0–8.3)
Total Bilirubin: 0.4 mg/dL (ref 0.2–1.2)

## 2018-01-06 LAB — CBC
HCT: 38.1 % (ref 36.0–46.0)
HEMOGLOBIN: 12.7 g/dL (ref 12.0–15.0)
MCHC: 33.3 g/dL (ref 30.0–36.0)
MCV: 92.5 fl (ref 78.0–100.0)
Platelets: 251 10*3/uL (ref 150.0–400.0)
RBC: 4.11 Mil/uL (ref 3.87–5.11)
RDW: 12.9 % (ref 11.5–15.5)
WBC: 8.3 10*3/uL (ref 4.0–10.5)

## 2018-01-06 LAB — VITAMIN B12: VITAMIN B 12: 272 pg/mL (ref 211–911)

## 2018-01-06 LAB — TSH: TSH: 2 u[IU]/mL (ref 0.35–4.50)

## 2018-01-06 LAB — HEMOGLOBIN A1C: Hgb A1c MFr Bld: 4.9 % (ref 4.6–6.5)

## 2018-01-06 LAB — VITAMIN D 25 HYDROXY (VIT D DEFICIENCY, FRACTURES): VITD: 17.06 ng/mL — ABNORMAL LOW (ref 30.00–100.00)

## 2018-01-06 NOTE — Progress Notes (Signed)
   Subjective:   Patient ID: Sara Hunt, female    DOB: [birth year], 43 y.o.   MRN: [id number]  HPI The patient is a 43 YO female coming in for several concerns including blood pressure (BP running up a little recently and she had gained some weight, working on losing it again, taking her metoprolol, denies heart palpitations) and cholesterol (stopped taking crestor a long time ago and is worried that cholesterol levels are high, denies chest pains or stroke symptoms, denies side effects with it) and fatigue (wants to know if vitamin D is low, she has had problems in the past).   Review of Systems  Constitutional: Negative.   HENT: Negative.   Eyes: Negative.   Respiratory: Negative for cough, chest tightness and shortness of breath.   Cardiovascular: Negative for chest pain, palpitations and leg swelling.  Gastrointestinal: Negative for abdominal distention, abdominal pain, constipation, diarrhea, nausea and vomiting.  Musculoskeletal: Positive for gait problem.  Skin: Negative.   Psychiatric/Behavioral: Negative.     Objective:  Physical Exam Constitutional:      Appearance: She is well-developed.  HENT:     Head: Normocephalic and atraumatic.  Neck:     Musculoskeletal: Normal range of motion.  Cardiovascular:     Rate and Rhythm: Normal rate and regular rhythm.  Pulmonary:     Effort: Pulmonary effort is normal. No respiratory distress.     Breath sounds: Normal breath sounds. No wheezing or rales.  Abdominal:     General: Bowel sounds are normal. There is no distension.     Palpations: Abdomen is soft.     Tenderness: There is no abdominal tenderness. There is no rebound.  Skin:    General: Skin is warm and dry.  Neurological:     Mental Status: She is alert and oriented to person, place, and time.     Coordination: Coordination abnormal.     Comments: Wheelchair, self propels     Vitals:   01/06/18 1304 01/06/18 1431  BP: (!) 142/100 140/90  Pulse: 67   Temp:  97.9 F (36.6 C)   TempSrc: Oral   SpO2: 97%   Weight: 241 lb (109.3 kg)   Height: 5\' 6"  (1.676 m)     Assessment & Plan:

## 2018-01-06 NOTE — Patient Instructions (Signed)
We will check the labs today and call you back about the results.    

## 2018-01-07 ENCOUNTER — Other Ambulatory Visit: Payer: Self-pay | Admitting: Internal Medicine

## 2018-01-07 DIAGNOSIS — R5383 Other fatigue: Secondary | ICD-10-CM | POA: Insufficient documentation

## 2018-01-07 MED ORDER — VITAMIN D (ERGOCALCIFEROL) 1.25 MG (50000 UNIT) PO CAPS: 50000.0000 [IU] | ORAL_CAPSULE | ORAL | 0 refills | Status: DC

## 2018-01-07 MED ORDER — ROSUVASTATIN CALCIUM 10 MG PO TABS: 10.0000 mg | ORAL_TABLET | Freq: Every day | ORAL | 3 refills | Status: DC

## 2018-01-07 NOTE — Assessment & Plan Note (Signed)
BP elevated initial and then down on recheck. Will monitor at next visit and adjust if needed.

## 2018-01-07 NOTE — Assessment & Plan Note (Signed)
Metoprolol 25 mg daily and no episodes SVT. BP mildly high initially and then down on recheck.

## 2018-01-07 NOTE — Assessment & Plan Note (Signed)
Checking lipid panel and adjust as needed. Off meds currently. Previously on crestor 10 mg daily.

## 2018-01-07 NOTE — Assessment & Plan Note (Signed)
Checking vitamin D, B12, TSH. Adjust as needed.

## 2018-02-16 DIAGNOSIS — J029 Acute pharyngitis, unspecified: Secondary | ICD-10-CM | POA: Diagnosis not present

## 2018-04-07 ENCOUNTER — Telehealth: Payer: Self-pay | Admitting: Sports Medicine

## 2018-04-07 NOTE — Telephone Encounter (Signed)
I called and left the patient a voicemail explaining that I did call on 07/21/17 and explained via voicemail that : Sara Hunt B 07/21/2017 03:56 PM  I LVM for the patient explaining that nothing is in her account at this time for $50. Once she receives her statement then we can request a waive of the $50. Awaiting patients call once she receives her statement.   I also called on 07/13/17 originally.   Sara Hunt B 07/13/2017 09:38 AM  LVM for patient to call back to discuss her $50 no show fee. I mentioned that I would possibly be out of the office from 11-3 today and could call her back if the call is missed.    I am emailing charge corrections to waive the fee out of courtesy. I explained on the patient's voicemail that this would take time so another statement may be received in the meantime. No further action required as email was sent today to Charge Corrections to waive.   Copied from [place] [phone]. Topic: General - Other >> Apr 06, 2018  4:27 PM Valla Leaver wrote: Reason for CRM: Patient would like a call back to discuss NO SHOW fee from 07/05/2017. Went to collections and no one ever followed up with her from the practice last year about waiving the fee. Please call back to assist. Billing has not helped her.

## 2018-04-29 ENCOUNTER — Other Ambulatory Visit: Payer: Self-pay | Admitting: Internal Medicine

## 2018-05-12 ENCOUNTER — Encounter: Payer: Self-pay | Admitting: Internal Medicine

## 2018-05-12 MED ORDER — VITAMIN D (ERGOCALCIFEROL) 1.25 MG (50000 UNIT) PO CAPS: 50000.0000 [IU] | ORAL_CAPSULE | ORAL | 2 refills | Status: DC

## 2018-10-20 ENCOUNTER — Ambulatory Visit: Payer: 59 | Admitting: Internal Medicine

## 2018-10-27 ENCOUNTER — Ambulatory Visit (INDEPENDENT_AMBULATORY_CARE_PROVIDER_SITE_OTHER): Payer: 59 | Admitting: Internal Medicine

## 2018-10-27 ENCOUNTER — Other Ambulatory Visit: Payer: Self-pay

## 2018-10-27 ENCOUNTER — Encounter: Payer: Self-pay | Admitting: Internal Medicine

## 2018-10-27 ENCOUNTER — Other Ambulatory Visit (INDEPENDENT_AMBULATORY_CARE_PROVIDER_SITE_OTHER): Payer: 59

## 2018-10-27 VITALS — BP 152/92 | HR 97 | Temp 98.5°F | Ht 66.0 in | Wt 243.0 lb

## 2018-10-27 DIAGNOSIS — E785 Hyperlipidemia, unspecified: Secondary | ICD-10-CM

## 2018-10-27 DIAGNOSIS — R5383 Other fatigue: Secondary | ICD-10-CM

## 2018-10-27 DIAGNOSIS — I10 Essential (primary) hypertension: Secondary | ICD-10-CM

## 2018-10-27 LAB — COMPREHENSIVE METABOLIC PANEL
ALT: 16 U/L (ref 0–35)
AST: 18 U/L (ref 0–37)
Albumin: 3.8 g/dL (ref 3.5–5.2)
Alkaline Phosphatase: 62 U/L (ref 39–117)
BUN: 14 mg/dL (ref 6–23)
CO2: 31 mEq/L (ref 19–32)
Calcium: 9.4 mg/dL (ref 8.4–10.5)
Chloride: 101 mEq/L (ref 96–112)
Creatinine, Ser: 0.8 mg/dL (ref 0.40–1.20)
GFR: 94.91 mL/min (ref >60.00)
Glucose, Bld: 95 mg/dL (ref 70–99)
Potassium: 3.5 mEq/L (ref 3.5–5.1)
Sodium: 136 mEq/L (ref 135–145)
Total Bilirubin: 0.3 mg/dL (ref 0.2–1.2)
Total Protein: 7.7 g/dL (ref 6.0–8.3)

## 2018-10-27 LAB — LIPID PANEL
Cholesterol: 198 mg/dL (ref 0–200)
HDL: 34.3 mg/dL — ABNORMAL LOW (ref >39.00)
LDL Cholesterol: 136 mg/dL — ABNORMAL HIGH (ref 0–99)
NonHDL: 163.38
Total CHOL/HDL Ratio: 6
Triglycerides: 137 mg/dL (ref 0.0–149.0)
VLDL: 27.4 mg/dL (ref 0.0–40.0)

## 2018-10-27 LAB — CBC
HCT: 38.1 % (ref 36.0–46.0)
Hemoglobin: 12.8 g/dL (ref 12.0–15.0)
MCHC: 33.5 g/dL (ref 30.0–36.0)
MCV: 96 fl (ref 78.0–100.0)
Platelets: 229 10*3/uL (ref 150.0–400.0)
RBC: 3.97 Mil/uL (ref 3.87–5.11)
RDW: 11.8 % (ref 11.5–15.5)
WBC: 8.2 10*3/uL (ref 4.0–10.5)

## 2018-10-27 LAB — VITAMIN D 25 HYDROXY (VIT D DEFICIENCY, FRACTURES): VITD: 17.23 ng/mL — ABNORMAL LOW (ref 30.00–100.00)

## 2018-10-27 LAB — T4, FREE: Free T4: 0.78 ng/dL (ref 0.60–1.60)

## 2018-10-27 LAB — TSH: TSH: 1.02 u[IU]/mL (ref 0.35–4.50)

## 2018-10-27 LAB — HEMOGLOBIN A1C: Hgb A1c MFr Bld: 5.1 % (ref 4.6–6.5)

## 2018-10-27 LAB — VITAMIN B12: Vitamin B-12: 345 pg/mL (ref 211–911)

## 2018-10-27 NOTE — Patient Instructions (Signed)
We will check the labs today. 

## 2018-10-27 NOTE — Progress Notes (Signed)
   Subjective:   Patient ID: Sara Hunt, female    DOB: 1975-12-06, 43 y.o.   MRN: [id number]  HPI The patient is a 43 YO female coming in for concerns about blood pressure. For some time she was having pain in the left arm and she has high blood pressure. She was worried that it was running high. Denies headaches or chest pains. No significant known history of heart attack or stroke. No personal history of heart attack or stroke. She has been struggling with some depression recently and is seeing therapist recently. Just a lack of motivation. She has been eating more since the pandemic and she is going to start working on that soon.  Review of Systems  Constitutional: Positive for appetite change.  HENT: Negative.   Eyes: Negative.   Respiratory: Negative for cough, chest tightness and shortness of breath.   Cardiovascular: Negative for chest pain, palpitations and leg swelling.  Gastrointestinal: Negative for abdominal distention, abdominal pain, constipation, diarrhea, nausea and vomiting.  Musculoskeletal: Positive for gait problem.  Skin: Negative.   Psychiatric/Behavioral: Positive for decreased concentration, dysphoric mood and sleep disturbance. The patient is nervous/anxious.     Objective:  Physical Exam Constitutional:      Appearance: She is well-developed. She is obese.  HENT:     Head: Normocephalic and atraumatic.  Neck:     Musculoskeletal: Normal range of motion.  Cardiovascular:     Rate and Rhythm: Normal rate and regular rhythm.  Pulmonary:     Effort: Pulmonary effort is normal. No respiratory distress.     Breath sounds: Normal breath sounds. No wheezing or rales.  Abdominal:     General: Bowel sounds are normal. There is no distension.     Palpations: Abdomen is soft.     Tenderness: There is no abdominal tenderness. There is no rebound.  Skin:    General: Skin is warm and dry.  Neurological:     Mental Status: She is alert and oriented to person,  place, and time. Mental status is at baseline.     Coordination: Coordination abnormal.     Comments: Wheelchair for ambulation     Vitals:   10/27/18 1551 10/27/18 1629  BP: (!) 180/90 (!) 152/92  Pulse: 97   Temp: 98.5 F (36.9 C)   TempSrc: Oral   SpO2: 98%   Weight: 243 lb (110.2 kg)   Height: 5\' 6"  (1.676 m)     Assessment & Plan:  Visit time 25 minutes: greater than 50% of that time was spent in face to face counseling and coordination of care with the patient: counseled about weight, depression, blood pressure, prior EKG and family history

## 2018-10-28 NOTE — Assessment & Plan Note (Signed)
Checking labs to make sure no underlying cause and working with therapist for some depression.

## 2018-10-28 NOTE — Assessment & Plan Note (Signed)
BP is up slightly and she is going to work on diet to help with this and working on her depression which will help with the eating. She will keep taking metoprolol.

## 2018-10-29 ENCOUNTER — Other Ambulatory Visit: Payer: Self-pay | Admitting: Internal Medicine

## 2018-12-19 ENCOUNTER — Encounter: Payer: Self-pay | Admitting: Internal Medicine

## 2018-12-19 MED ORDER — METOPROLOL SUCCINATE ER 50 MG PO TB24: 50.0000 mg | ORAL_TABLET | Freq: Every day | ORAL | 3 refills | Status: DC

## 2019-01-07 ENCOUNTER — Ambulatory Visit (INDEPENDENT_AMBULATORY_CARE_PROVIDER_SITE_OTHER)
Admission: RE | Admit: 2019-01-07 | Discharge: 2019-01-07 | Disposition: A | Discharge status: Home / Self Care | Payer: 59 | Source: Ambulatory Visit

## 2019-01-07 DIAGNOSIS — M546 Pain in thoracic spine: Secondary | ICD-10-CM

## 2019-01-07 DIAGNOSIS — Z9889 Other specified postprocedural states: Secondary | ICD-10-CM | POA: Diagnosis not present

## 2019-01-07 DIAGNOSIS — Z87828 Personal history of other (healed) physical injury and trauma: Secondary | ICD-10-CM | POA: Diagnosis not present

## 2019-01-07 DIAGNOSIS — Z993 Dependence on wheelchair: Secondary | ICD-10-CM | POA: Diagnosis not present

## 2019-01-07 MED ORDER — CYCLOBENZAPRINE HCL 10 MG PO TABS: 10.0000 mg | ORAL_TABLET | Freq: Every evening | ORAL | 0 refills | Status: DC | PRN

## 2019-01-07 MED ORDER — PREDNISONE 20 MG PO TABS: ORAL_TABLET | ORAL | 0 refills | Status: DC
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
## 2019-01-10 ENCOUNTER — Encounter: Payer: Self-pay | Admitting: Internal Medicine

## 2019-01-30 ENCOUNTER — Other Ambulatory Visit: Payer: Self-pay | Admitting: Neurosurgery

## 2019-01-30 DIAGNOSIS — S24103A Unspecified injury at T7-T10 level of thoracic spinal cord, initial encounter: Secondary | ICD-10-CM

## 2019-02-09 ENCOUNTER — Ambulatory Visit
Admission: RE | Admit: 2019-02-09 | Discharge: 2019-02-09 | Disposition: A | Discharge status: Home / Self Care | Payer: 59 | Source: Ambulatory Visit | Attending: Neurosurgery | Admitting: Neurosurgery

## 2019-02-09 ENCOUNTER — Other Ambulatory Visit: Payer: Self-pay | Admitting: Internal Medicine

## 2019-02-09 DIAGNOSIS — S24103A Unspecified injury at T7-T10 level of thoracic spinal cord, initial encounter: Secondary | ICD-10-CM

## 2019-05-26 ENCOUNTER — Encounter: Payer: Self-pay | Admitting: Internal Medicine

## 2019-05-26 ENCOUNTER — Other Ambulatory Visit: Payer: Self-pay

## 2019-05-26 ENCOUNTER — Ambulatory Visit: Payer: 59 | Admitting: Internal Medicine

## 2019-05-26 VITALS — BP 146/86 | HR 66 | Temp 98.1°F | Ht 66.0 in | Wt 175.8 lb

## 2019-05-26 DIAGNOSIS — R5383 Other fatigue: Secondary | ICD-10-CM

## 2019-05-26 DIAGNOSIS — E559 Vitamin D deficiency, unspecified: Secondary | ICD-10-CM | POA: Diagnosis not present

## 2019-05-26 LAB — COMPREHENSIVE METABOLIC PANEL
ALT: 17 U/L (ref 0–35)
AST: 19 U/L (ref 0–37)
Albumin: 3.9 g/dL (ref 3.5–5.2)
Alkaline Phosphatase: 62 U/L (ref 39–117)
BUN: 13 mg/dL (ref 6–23)
CO2: 31 mEq/L (ref 19–32)
Calcium: 9.4 mg/dL (ref 8.4–10.5)
Chloride: 103 mEq/L (ref 96–112)
Creatinine, Ser: 0.72 mg/dL (ref 0.40–1.20)
GFR: 106.89 mL/min (ref >60.00)
Glucose, Bld: 107 mg/dL — ABNORMAL HIGH (ref 70–99)
Potassium: 3.7 mEq/L (ref 3.5–5.1)
Sodium: 136 mEq/L (ref 135–145)
Total Bilirubin: 0.3 mg/dL (ref 0.2–1.2)
Total Protein: 7.7 g/dL (ref 6.0–8.3)

## 2019-05-26 LAB — CBC
HCT: 38 % (ref 36.0–46.0)
Hemoglobin: 12.8 g/dL (ref 12.0–15.0)
MCHC: 33.8 g/dL (ref 30.0–36.0)
MCV: 94.4 fl (ref 78.0–100.0)
Platelets: 219 10*3/uL (ref 150.0–400.0)
RBC: 4.02 Mil/uL (ref 3.87–5.11)
RDW: 12.4 % (ref 11.5–15.5)
WBC: 7.5 10*3/uL (ref 4.0–10.5)

## 2019-05-26 LAB — FERRITIN: Ferritin: 84.5 ng/mL (ref 10.0–291.0)

## 2019-05-26 LAB — TSH: TSH: 1.27 u[IU]/mL (ref 0.35–4.50)

## 2019-05-26 LAB — VITAMIN D 25 HYDROXY (VIT D DEFICIENCY, FRACTURES): VITD: 21.09 ng/mL — ABNORMAL LOW (ref 30.00–100.00)

## 2019-05-26 NOTE — Progress Notes (Signed)
   Subjective:   Patient ID: Sara Hunt, female    DOB: 31-Aug-1975, 44 y.o.   MRN: [id number]  HPI The patient is a 44 YO female coming in for fatigue which is excessive. She seems to be napping and sleeping more but not feeling rested. Denies weight loss/gain. Denies SOB or chest pains. Rare palpitations not different than usual. Past hysterectomy. Past history of vitamin D deficiency and she is taking vitamins to help. Denies headaches or high BP at home. Taking medications as prescribed and no recent changes to medications or doses.   Review of Systems  Constitutional: Positive for fatigue.  HENT: Negative.   Eyes: Negative.   Respiratory: Negative for cough, chest tightness and shortness of breath.   Cardiovascular: Negative for chest pain, palpitations and leg swelling.  Gastrointestinal: Negative for abdominal distention, abdominal pain, constipation, diarrhea, nausea and vomiting.  Musculoskeletal: Negative.   Skin: Negative.   Neurological: Negative.   Psychiatric/Behavioral: Negative.     Objective:  Physical Exam Constitutional:      Appearance: She is well-developed.  HENT:     Head: Normocephalic and atraumatic.  Cardiovascular:     Rate and Rhythm: Normal rate and regular rhythm.  Pulmonary:     Effort: Pulmonary effort is normal. No respiratory distress.     Breath sounds: Normal breath sounds. No wheezing or rales.  Abdominal:     General: Bowel sounds are normal. There is no distension.     Palpations: Abdomen is soft.     Tenderness: There is no abdominal tenderness. There is no rebound.  Musculoskeletal:     Cervical back: Normal range of motion.  Skin:    General: Skin is warm and dry.  Neurological:     Mental Status: She is alert and oriented to person, place, and time. Mental status is at baseline.     Coordination: Coordination abnormal.     Vitals:   05/26/19 1531  BP: (!) 146/86  Pulse: 66  Temp: 98.1 F (36.7 C)  SpO2: 100%  Weight: 175  lb 12.8 oz (79.7 kg)  Height: 5\' 6"  (1.676 m)    This visit occurred during the SARS-CoV-2 public health emergency.  Safety protocols were in place, including screening questions prior to the visit, additional usage of staff PPE, and extensive cleaning of exam room while observing appropriate contact time as indicated for disinfecting solutions.   Assessment & Plan:

## 2019-05-26 NOTE — Patient Instructions (Signed)
We will check the labs today and call you back about the results.    

## 2019-05-26 NOTE — Assessment & Plan Note (Signed)
Taking otc vitamin d currently with multiple past low readings and current fatigue.

## 2019-05-26 NOTE — Assessment & Plan Note (Signed)
Checking labs and adjust as needed. If no abnormal labs can consider home sleep study.

## 2019-05-29 LAB — VITAMIN B12: Vitamin B-12: 278 pg/mL (ref 211–911)

## 2019-05-30 ENCOUNTER — Other Ambulatory Visit: Payer: Self-pay | Admitting: Internal Medicine

## 2019-05-30 MED ORDER — VITAMIN D (ERGOCALCIFEROL) 1.25 MG (50000 UNIT) PO CAPS
50000.0000 [IU] | ORAL_CAPSULE | ORAL | 3 refills | Status: DC
Start: 2019-05-30 — End: 2021-05-30

## 2019-05-31 ENCOUNTER — Encounter: Payer: Self-pay | Admitting: Internal Medicine

## 2019-07-04 ENCOUNTER — Telehealth: Payer: Self-pay

## 2019-07-04 NOTE — Telephone Encounter (Signed)
New message   1.Medication Requested:rosuvastatin (CRESTOR) 10 MG tablet  2. Pharmacy (Name, Street, [place]):[place], [address]  3. On Med List: Yes   4. Last Visit with PCP: 5.21.21   5. Next visit date with PCP:  n/a   Agent: Please be advised that RX refills may take up to 3 business days. We ask that you follow-up with your pharmacy.

## 2019-07-05 MED ORDER — ROSUVASTATIN CALCIUM 10 MG PO TABS: 10.0000 mg | ORAL_TABLET | Freq: Every day | ORAL | 0 refills | Status: DC

## 2019-07-18 ENCOUNTER — Encounter: Payer: Self-pay | Admitting: Internal Medicine

## 2019-07-18 ENCOUNTER — Ambulatory Visit: Payer: 59 | Admitting: Internal Medicine

## 2019-07-18 ENCOUNTER — Other Ambulatory Visit: Payer: Self-pay

## 2019-07-18 DIAGNOSIS — I10 Essential (primary) hypertension: Secondary | ICD-10-CM | POA: Diagnosis not present

## 2019-07-18 DIAGNOSIS — M940 Chondrocostal junction syndrome [Tietze]: Secondary | ICD-10-CM | POA: Diagnosis not present

## 2019-07-18 NOTE — Progress Notes (Signed)
   Subjective:   Patient ID: Sara Hunt, female    DOB: 02/10/1975, 44 y.o.   MRN: [id number]  HPI The patient is a 44 YO female coming in for concerns about left arm pain. Started about 3 days ago. Denies new activity or overuse but does use arms often. Overall it is stable to mild improvement. Denies chest pain with activity but chest muscles are sore to touch. Has tried nothing for pain. Pain in left shoulder and upper arm.Negative stress test about 10 years ago. BP at goal lately due to medication changes.   Review of Systems  Constitutional: Negative.   HENT: Negative.   Eyes: Negative.   Respiratory: Negative for cough, chest tightness and shortness of breath.   Cardiovascular: Negative for chest pain, palpitations and leg swelling.  Gastrointestinal: Negative for abdominal distention, abdominal pain, constipation, diarrhea, nausea and vomiting.  Musculoskeletal: Positive for gait problem and myalgias.  Skin: Negative.   Psychiatric/Behavioral: Negative.     Objective:  Physical Exam Constitutional:      Appearance: She is well-developed.  HENT:     Head: Normocephalic and atraumatic.  Cardiovascular:     Rate and Rhythm: Normal rate and regular rhythm.  Pulmonary:     Effort: Pulmonary effort is normal. No respiratory distress.     Breath sounds: Normal breath sounds. No wheezing or rales.  Abdominal:     General: Bowel sounds are normal. There is no distension.     Palpations: Abdomen is soft.     Tenderness: There is no abdominal tenderness. There is no rebound.  Musculoskeletal:        General: Tenderness present.     Cervical back: Normal range of motion.     Comments: Pain to palpation left shoulder, left upper arm, and left chest wall, some tenderness with ROM but full ROM.   Skin:    General: Skin is warm and dry.  Neurological:     Mental Status: She is alert and oriented to person, place, and time.     Coordination: Coordination abnormal.     Comments:  wheelchair     Vitals:   07/18/19 1029  BP: (!) 144/92  Pulse: 69  Temp: 98.3 F (36.8 C)  TempSrc: Oral  SpO2: 97%  Height: 5\' 6"  (1.676 m)    This visit occurred during the SARS-CoV-2 public health emergency.  Safety protocols were in place, including screening questions prior to the visit, additional usage of staff PPE, and extensive cleaning of exam room while observing appropriate contact time as indicated for disinfecting solutions.   Assessment & Plan:

## 2019-07-18 NOTE — Patient Instructions (Signed)
This is likely muscular.

## 2019-07-20 ENCOUNTER — Encounter: Payer: Self-pay | Admitting: Internal Medicine

## 2019-07-20 DIAGNOSIS — M940 Chondrocostal junction syndrome [Tietze]: Secondary | ICD-10-CM | POA: Insufficient documentation

## 2019-07-20 NOTE — Assessment & Plan Note (Signed)
BP is better controlled with metoprolol 50 mg daily. No adjustment today. She is also working on exercise and diet.

## 2019-07-20 NOTE — Assessment & Plan Note (Signed)
BP at goal, likely musculoskeletal in nature and reassurance given. Normal stress test 10 years ago.

## 2019-07-31 ENCOUNTER — Telehealth: Payer: Self-pay | Admitting: Internal Medicine

## 2019-07-31 NOTE — Telephone Encounter (Signed)
Called pt, LVM.   

## 2019-07-31 NOTE — Telephone Encounter (Signed)
I would recommend to stop the prednisone. She can take flonase which should help with sinus inflammation.

## 2019-07-31 NOTE — Telephone Encounter (Signed)
New message:   Pt is calling and states she went to urgent care on Saturday and she states she had a sinus infection with a lot of inflammation. She states her BP was high and they gave her Prednisone. She states that medication made her BP rise even more. She would like to know what kind of medication she can take to help with the inflammation that will not affect her BP. Please advise.

## 2019-08-14 LAB — HM MAMMOGRAPHY

## 2019-09-06 ENCOUNTER — Encounter: Payer: Self-pay | Admitting: Internal Medicine

## 2019-09-07 MED ORDER — ROSUVASTATIN CALCIUM 10 MG PO TABS: 10.0000 mg | ORAL_TABLET | Freq: Every day | ORAL | 0 refills | Status: DC

## 2019-10-24 ENCOUNTER — Other Ambulatory Visit: Payer: Self-pay

## 2019-10-24 ENCOUNTER — Encounter (HOSPITAL_COMMUNITY): Payer: Self-pay | Admitting: Emergency Medicine

## 2019-10-24 ENCOUNTER — Emergency Department (HOSPITAL_COMMUNITY)
Admission: EM | Admit: 2019-10-24 | Discharge: 2019-10-24 | Disposition: A | Discharge status: Home / Self Care | Payer: 59 | Attending: Emergency Medicine | Admitting: Emergency Medicine

## 2019-10-24 DIAGNOSIS — Z87891 Personal history of nicotine dependence: Secondary | ICD-10-CM | POA: Insufficient documentation

## 2019-10-24 DIAGNOSIS — R519 Headache, unspecified: Secondary | ICD-10-CM | POA: Insufficient documentation

## 2019-10-24 DIAGNOSIS — Z79899 Other long term (current) drug therapy: Secondary | ICD-10-CM | POA: Diagnosis not present

## 2019-10-24 DIAGNOSIS — Z91040 Latex allergy status: Secondary | ICD-10-CM | POA: Insufficient documentation

## 2019-10-24 DIAGNOSIS — I10 Essential (primary) hypertension: Secondary | ICD-10-CM | POA: Insufficient documentation
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
## 2019-10-24 NOTE — ED Triage Notes (Signed)
Per pt, states headache for a couple of days-states she takes Metoprolol for HTN-states pain in back of head

## 2019-10-31 ENCOUNTER — Telehealth: Payer: Self-pay | Admitting: Internal Medicine

## 2019-10-31 NOTE — Telephone Encounter (Signed)
Patient is calling and wanted to transfer from Dr. Sharlet Salina to Sara Hunt. Is this ok?

## 2019-10-31 NOTE — Telephone Encounter (Signed)
I will not be able to accommodate a transfer at this time due to limited providers and limited slots for new patients in this office. Thank you

## 2019-11-01 NOTE — Telephone Encounter (Signed)
I called pt to let her know. Pt aware

## 2019-11-22 ENCOUNTER — Other Ambulatory Visit: Payer: Self-pay

## 2019-11-22 ENCOUNTER — Telehealth: Payer: Self-pay | Admitting: Internal Medicine

## 2019-11-22 MED ORDER — ROSUVASTATIN CALCIUM 10 MG PO TABS: 10.0000 mg | ORAL_TABLET | Freq: Every day | ORAL | 0 refills | Status: DC

## 2019-11-22 NOTE — Telephone Encounter (Signed)
1.Medication Requested: rosuvastatin (CRESTOR) 10 MG tablet 2. Pharmacy (Name, Street, [place]): [place], [address] Phone:  [phone]  Fax:  [phone]      3. On Med List:yes  4. Last Visit with PCP:07.13.21  5. Next visit date with PCP:12.10.21   Requesting a refill, patient has been out of medication for three weeks.

## 2019-12-15 ENCOUNTER — Other Ambulatory Visit: Payer: Self-pay

## 2019-12-15 ENCOUNTER — Ambulatory Visit (INDEPENDENT_AMBULATORY_CARE_PROVIDER_SITE_OTHER): Payer: 59 | Admitting: Internal Medicine

## 2019-12-15 ENCOUNTER — Encounter: Payer: Self-pay | Admitting: Internal Medicine

## 2019-12-15 VITALS — BP 150/98 | HR 70 | Temp 98.3°F | Ht 66.0 in

## 2019-12-15 DIAGNOSIS — E782 Mixed hyperlipidemia: Secondary | ICD-10-CM | POA: Diagnosis not present

## 2019-12-15 DIAGNOSIS — Z1159 Encounter for screening for other viral diseases: Secondary | ICD-10-CM | POA: Diagnosis not present

## 2019-12-15 DIAGNOSIS — I10 Essential (primary) hypertension: Secondary | ICD-10-CM | POA: Diagnosis not present

## 2019-12-15 DIAGNOSIS — I471 Supraventricular tachycardia: Secondary | ICD-10-CM

## 2019-12-15 DIAGNOSIS — Z23 Encounter for immunization: Secondary | ICD-10-CM

## 2019-12-15 DIAGNOSIS — Z0000 Encounter for general adult medical examination without abnormal findings: Secondary | ICD-10-CM | POA: Diagnosis not present

## 2019-12-15 LAB — COMPREHENSIVE METABOLIC PANEL
ALT: 16 U/L (ref 0–35)
AST: 21 U/L (ref 0–37)
Albumin: 3.7 g/dL (ref 3.5–5.2)
Alkaline Phosphatase: 58 U/L (ref 39–117)
BUN: 10 mg/dL (ref 6–23)
CO2: 30 mEq/L (ref 19–32)
Calcium: 9.4 mg/dL (ref 8.4–10.5)
Chloride: 103 mEq/L (ref 96–112)
Creatinine, Ser: 0.82 mg/dL (ref 0.40–1.20)
GFR: 87.42 mL/min (ref >60.00)
Glucose, Bld: 96 mg/dL (ref 70–99)
Potassium: 3.9 mEq/L (ref 3.5–5.1)
Sodium: 138 mEq/L (ref 135–145)
Total Bilirubin: 0.4 mg/dL (ref 0.2–1.2)
Total Protein: 7.8 g/dL (ref 6.0–8.3)

## 2019-12-15 LAB — LIPID PANEL
Cholesterol: 196 mg/dL (ref 0–200)
HDL: 36 mg/dL — ABNORMAL LOW (ref >39.00)
LDL Cholesterol: 146 mg/dL — ABNORMAL HIGH (ref 0–99)
NonHDL: 159.75
Total CHOL/HDL Ratio: 5
Triglycerides: 67 mg/dL (ref 0.0–149.0)
VLDL: 13.4 mg/dL (ref 0.0–40.0)

## 2019-12-15 LAB — CBC
HCT: 37.4 % (ref 36.0–46.0)
Hemoglobin: 12.3 g/dL (ref 12.0–15.0)
MCHC: 33 g/dL (ref 30.0–36.0)
MCV: 93.8 fl (ref 78.0–100.0)
Platelets: 217 10*3/uL (ref 150.0–400.0)
RBC: 3.98 Mil/uL (ref 3.87–5.11)
RDW: 12.6 % (ref 11.5–15.5)
WBC: 6.2 10*3/uL (ref 4.0–10.5)

## 2019-12-15 MED ORDER — ROSUVASTATIN CALCIUM 10 MG PO TABS: 10.0000 mg | ORAL_TABLET | Freq: Every day | ORAL | 3 refills | Status: DC

## 2019-12-15 MED ORDER — METOPROLOL SUCCINATE ER 50 MG PO TB24: 50.0000 mg | ORAL_TABLET | Freq: Every day | ORAL | 3 refills | Status: DC

## 2019-12-15 NOTE — Progress Notes (Signed)
   Subjective:   Patient ID: Sara Hunt, female    DOB: 02-Feb-1975, 44 y.o.   MRN: [id number]  HPI The patient is a 44 YO female coming in for physical. Changing jobs soon to help relieve stress.   PMH, [facility], social history reviewed and updated  Review of Systems  Constitutional: Negative.   HENT: Negative.   Eyes: Negative.   Respiratory: Negative for cough, chest tightness and shortness of breath.   Cardiovascular: Negative for chest pain, palpitations and leg swelling.  Gastrointestinal: Negative for abdominal distention, abdominal pain, constipation, diarrhea, nausea and vomiting.  Musculoskeletal: Positive for gait problem.  Skin: Negative.   Neurological: Positive for headaches.  Psychiatric/Behavioral: Negative.     Objective:  Physical Exam Constitutional:      Appearance: She is well-developed and well-nourished.  HENT:     Head: Normocephalic and atraumatic.  Eyes:     Extraocular Movements: EOM normal.  Cardiovascular:     Rate and Rhythm: Normal rate and regular rhythm.  Pulmonary:     Effort: Pulmonary effort is normal. No respiratory distress.     Breath sounds: Normal breath sounds. No wheezing or rales.  Abdominal:     General: Bowel sounds are normal. There is no distension.     Palpations: Abdomen is soft.     Tenderness: There is no abdominal tenderness. There is no rebound.  Musculoskeletal:        General: No edema.     Cervical back: Normal range of motion.  Skin:    General: Skin is warm and dry.  Neurological:     Mental Status: She is alert and oriented to person, place, and time.     Coordination: Coordination abnormal.     Comments: wheelchair  Psychiatric:        Mood and Affect: Mood and affect normal.     Vitals:   12/15/19 1015  BP: (!) 150/98  Pulse: 70  Temp: 98.3 F (36.8 C)  TempSrc: Oral  SpO2: 97%  Height: 5\' 6"  (1.676 m)    This visit occurred during the SARS-CoV-2 public health emergency.  Safety protocols were in  place, including screening questions prior to the visit, additional usage of staff PPE, and extensive cleaning of exam room while observing appropriate contact time as indicated for disinfecting solutions.   Assessment & Plan:  Tdap given at visit

## 2019-12-15 NOTE — Assessment & Plan Note (Signed)
Flu shot declines today. Covid-19 up to date including booster. Tetanus given today. Mammogram with gyn up to date, pap smear up to date. Counseled about sun safety and mole surveillance. Counseled about the dangers of distracted driving. Given 10 year screening recommendations.

## 2019-12-15 NOTE — Assessment & Plan Note (Signed)
Checking lipid panel and refilled crestor 10 mg daily. Was out for a few weeks last month.

## 2019-12-15 NOTE — Assessment & Plan Note (Addendum)
BP running mildly elevated. Taking metoprolol 50 mg daily. She will monitor and let us know once in new job with less stress if still elevated.

## 2019-12-15 NOTE — Assessment & Plan Note (Signed)
No recurrent after ablation which she cannot break with breathing and valsalva. Taking metoprolol 50 mg daily.

## 2019-12-15 NOTE — Patient Instructions (Signed)
Health Maintenance, Female Adopting a healthy lifestyle and getting preventive care are important in promoting health and wellness. Ask your health care provider about:  The right schedule for you to have regular tests and exams.  Things you can do on your own to prevent diseases and keep yourself healthy. What should I know about diet, weight, and exercise? Eat a healthy diet   Eat a diet that includes plenty of vegetables, fruits, low-fat dairy products, and lean protein.  Do not eat a lot of foods that are high in solid fats, added sugars, or sodium. Maintain a healthy weight Body mass index (BMI) is used to identify weight problems. It estimates body fat based on height and weight. Your health care provider can help determine your BMI and help you achieve or maintain a healthy weight. Get regular exercise Get regular exercise. This is one of the most important things you can do for your health. Most adults should:  Exercise for at least 150 minutes each week. The exercise should increase your heart rate and make you sweat (moderate-intensity exercise).  Do strengthening exercises at least twice a week. This is in addition to the moderate-intensity exercise.  Spend less time sitting. Even light physical activity can be beneficial. Watch cholesterol and blood lipids Have your blood tested for lipids and cholesterol at 43 years of age, then have this test every 5 years. Have your cholesterol levels checked more often if:  Your lipid or cholesterol levels are high.  You are older than 43 years of age.  You are at high risk for heart disease. What should I know about cancer screening? Depending on your health history and family history, you may need to have cancer screening at various ages. This may include screening for:  Breast cancer.  Cervical cancer.  Colorectal cancer.  Skin cancer.  Lung cancer. What should I know about heart disease, diabetes, and high blood  pressure? Blood pressure and heart disease  High blood pressure causes heart disease and increases the risk of stroke. This is more likely to develop in people who have high blood pressure readings, are of African descent, or are overweight.  Have your blood pressure checked: ? Every 3-5 years if you are 18-39 years of age. ? Every year if you are 40 years old or older. Diabetes Have regular diabetes screenings. This checks your fasting blood sugar level. Have the screening done:  Once every three years after age 40 if you are at a normal weight and have a low risk for diabetes.  More often and at a younger age if you are overweight or have a high risk for diabetes. What should I know about preventing infection? Hepatitis B If you have a higher risk for hepatitis B, you should be screened for this virus. Talk with your health care provider to find out if you are at risk for hepatitis B infection. Hepatitis C Testing is recommended for:  Everyone born from 1945 through 1965.  Anyone with known risk factors for hepatitis C. Sexually transmitted infections (STIs)  Get screened for STIs, including gonorrhea and chlamydia, if: ? You are sexually active and are younger than 43 years of age. ? You are older than 43 years of age and your health care provider tells you that you are at risk for this type of infection. ? Your sexual activity has changed since you were last screened, and you are at increased risk for chlamydia or gonorrhea. Ask your health care provider if   you are at risk.  Ask your health care provider about whether you are at high risk for HIV. Your health care provider may recommend a prescription medicine to help prevent HIV infection. If you choose to take medicine to prevent HIV, you should first get tested for HIV. You should then be tested every 3 months for as long as you are taking the medicine. Pregnancy  If you are about to stop having your period (premenopausal) and  you may become pregnant, seek counseling before you get pregnant.  Take 400 to 800 micrograms (mcg) of folic acid every day if you become pregnant.  Ask for birth control (contraception) if you want to prevent pregnancy. Osteoporosis and menopause Osteoporosis is a disease in which the bones lose minerals and strength with aging. This can result in bone fractures. If you are 65 years old or older, or if you are at risk for osteoporosis and fractures, ask your health care provider if you should:  Be screened for bone loss.  Take a calcium or vitamin D supplement to lower your risk of fractures.  Be given hormone replacement therapy (HRT) to treat symptoms of menopause. Follow these instructions at home: Lifestyle  Do not use any products that contain nicotine or tobacco, such as cigarettes, e-cigarettes, and chewing tobacco. If you need help quitting, ask your health care provider.  Do not use street drugs.  Do not share needles.  Ask your health care provider for help if you need support or information about quitting drugs. Alcohol use  Do not drink alcohol if: ? Your health care provider tells you not to drink. ? You are pregnant, may be pregnant, or are planning to become pregnant.  If you drink alcohol: ? Limit how much you use to 0-1 drink a day. ? Limit intake if you are breastfeeding.  Be aware of how much alcohol is in your drink. In the [place] one drink equals one 12 oz bottle of beer (355 mL), one 5 oz glass of wine (148 mL), or one 1 oz glass of hard liquor (44 mL). General instructions  Schedule regular health, dental, and eye exams.  Stay current with your vaccines.  Tell your health care provider if: ? You often feel depressed. ? You have ever been abused or do not feel safe at home. Summary  Adopting a healthy lifestyle and getting preventive care are important in promoting health and wellness.  Follow your health care provider's instructions about healthy  diet, exercising, and getting tested or screened for diseases.  Follow your health care provider's instructions on monitoring your cholesterol and blood pressure. This information is not intended to replace advice given to you by your health care provider. Make sure you discuss any questions you have with your health care provider. Document Revised: 12/15/2017 Document Reviewed: 12/15/2017 Elsevier Patient Education  2020 Elsevier Inc.  

## 2019-12-18 LAB — HEPATITIS C ANTIBODY
Hepatitis C Ab: NONREACTIVE
SIGNAL TO CUT-OFF: 0.03 (ref <1.00)

## 2020-02-21 ENCOUNTER — Encounter: Payer: Self-pay | Admitting: Internal Medicine

## 2020-03-12 ENCOUNTER — Other Ambulatory Visit: Payer: Self-pay

## 2020-03-12 MED ORDER — ROSUVASTATIN CALCIUM 10 MG PO TABS: 10.0000 mg | ORAL_TABLET | Freq: Every day | ORAL | 3 refills | Status: DC

## 2020-04-15 ENCOUNTER — Encounter: Payer: Self-pay | Admitting: Internal Medicine

## 2020-04-15 ENCOUNTER — Other Ambulatory Visit: Payer: Self-pay

## 2020-04-15 ENCOUNTER — Ambulatory Visit: Payer: 59 | Admitting: Internal Medicine

## 2020-04-15 DIAGNOSIS — H6693 Otitis media, unspecified, bilateral: Secondary | ICD-10-CM

## 2020-04-15 MED ORDER — FLUCONAZOLE 150 MG PO TABS: 150.0000 mg | ORAL_TABLET | ORAL | 0 refills | Status: DC

## 2020-04-15 MED ORDER — AMOXICILLIN-POT CLAVULANATE 875-125 MG PO TABS: 1.0000 | ORAL_TABLET | Freq: Two times a day (BID) | ORAL | 0 refills | Status: DC

## 2020-04-15 NOTE — Progress Notes (Signed)
   Subjective:   Patient ID: Sara Hunt, female    DOB: [birth year], 45 y.o.   MRN: [id number]  HPI The patient is a 45 YO female coming in for concerns about ears ringing. Feels like she has water in her ears. Going on for a couple of weeks. Does have some sinus drainage. Denies fevers or chills. Denies cough or SOB. Gets allergies and not taking anything for that now. Did take for a week beginning of April. She denies recent travel or exposure to sick individuals. Has not taken home covid-19 test but is vaccinated against covid-19.   Review of Systems  Constitutional: Negative.   HENT: Positive for ear pain and tinnitus.   Eyes: Negative.   Respiratory: Negative for cough, chest tightness and shortness of breath.   Cardiovascular: Negative for chest pain, palpitations and leg swelling.  Gastrointestinal: Negative for abdominal distention, abdominal pain, constipation, diarrhea, nausea and vomiting.  Musculoskeletal: Positive for gait problem.  Skin: Negative.   Psychiatric/Behavioral: Negative.     Objective:  Physical Exam Constitutional:      Appearance: She is well-developed.  HENT:     Head: Normocephalic and atraumatic.     Ears:     Comments: Left TM bulging clear fluid, right ear canal with redness and slight bulging canal clear fluid.     Nose: Rhinorrhea present.     Mouth/Throat:     Pharynx: Posterior oropharyngeal erythema present.  Eyes:     Extraocular Movements: Extraocular movements intact.     Conjunctiva/sclera: Conjunctivae normal.     Pupils: Pupils are equal, round, and reactive to light.  Cardiovascular:     Rate and Rhythm: Normal rate and regular rhythm.  Pulmonary:     Effort: Pulmonary effort is normal. No respiratory distress.     Breath sounds: Normal breath sounds. No wheezing or rales.  Abdominal:     General: Bowel sounds are normal. There is no distension.     Palpations: Abdomen is soft.     Tenderness: There is no abdominal tenderness.  There is no rebound.  Musculoskeletal:     Cervical back: Normal range of motion.  Skin:    General: Skin is warm and dry.  Neurological:     Mental Status: She is alert and oriented to person, place, and time. Mental status is at baseline.     Coordination: Coordination abnormal.     Vitals:   04/15/20 1556  BP: 134/76  Pulse: 67  Resp: 18  Temp: 98.4 F (36.9 C)  TempSrc: Oral  SpO2: 97%  Height: 5\' 6"  (1.676 m)    This visit occurred during the SARS-CoV-2 public health emergency.  Safety protocols were in place, including screening questions prior to the visit, additional usage of staff PPE, and extensive cleaning of exam room while observing appropriate contact time as indicated for disinfecting solutions.   Assessment & Plan:

## 2020-04-15 NOTE — Patient Instructions (Signed)
We have sent in augmentin to take 1 pill twice a day for 1 week. We have also sent in the diflucan to take for yeast if needed.

## 2020-04-16 DIAGNOSIS — H6690 Otitis media, unspecified, unspecified ear: Secondary | ICD-10-CM | POA: Insufficient documentation

## 2020-04-16 NOTE — Assessment & Plan Note (Signed)
Rx augmentin and diflucan.

## 2020-05-06 ENCOUNTER — Encounter: Payer: Self-pay | Admitting: Internal Medicine

## 2020-05-06 DIAGNOSIS — H9313 Tinnitus, bilateral: Secondary | ICD-10-CM

## 2020-06-19 ENCOUNTER — Ambulatory Visit (INDEPENDENT_AMBULATORY_CARE_PROVIDER_SITE_OTHER): Payer: 59 | Admitting: Otolaryngology

## 2020-06-19 DIAGNOSIS — H9313 Tinnitus, bilateral: Secondary | ICD-10-CM
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
## 2020-06-24 ENCOUNTER — Encounter: Payer: Self-pay | Admitting: Internal Medicine

## 2020-07-03 ENCOUNTER — Telehealth (INDEPENDENT_AMBULATORY_CARE_PROVIDER_SITE_OTHER): Payer: 59 | Admitting: Internal Medicine

## 2020-07-03 ENCOUNTER — Encounter: Payer: Self-pay | Admitting: Internal Medicine

## 2020-07-03 DIAGNOSIS — J019 Acute sinusitis, unspecified: Secondary | ICD-10-CM | POA: Diagnosis not present

## 2020-07-03 DIAGNOSIS — U071 COVID-19: Secondary | ICD-10-CM | POA: Diagnosis not present

## 2020-07-03 MED ORDER — AMOXICILLIN-POT CLAVULANATE 875-125 MG PO TABS: 1.0000 | ORAL_TABLET | Freq: Two times a day (BID) | ORAL | 0 refills | Status: DC

## 2020-07-03 MED ORDER — HYDROCOD POLST-CPM POLST ER 10-8 MG/5ML PO SUER: 5.0000 mL | Freq: Two times a day (BID) | ORAL | 0 refills | Status: DC | PRN
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
## 2020-07-26 ENCOUNTER — Other Ambulatory Visit: Payer: Self-pay

## 2020-07-26 MED ORDER — ROSUVASTATIN CALCIUM 10 MG PO TABS: 10.0000 mg | ORAL_TABLET | Freq: Every day | ORAL | 3 refills | Status: DC

## 2020-08-05 ENCOUNTER — Ambulatory Visit: Payer: 59 | Admitting: Internal Medicine

## 2020-09-06 ENCOUNTER — Encounter: Payer: Self-pay | Admitting: Internal Medicine

## 2020-09-06 ENCOUNTER — Other Ambulatory Visit: Payer: Self-pay

## 2020-09-06 ENCOUNTER — Ambulatory Visit: Payer: 59 | Admitting: Internal Medicine

## 2020-09-06 VITALS — BP 140/80 | HR 73 | Resp 18 | Ht 66.0 in

## 2020-09-06 DIAGNOSIS — I10 Essential (primary) hypertension: Secondary | ICD-10-CM | POA: Diagnosis not present

## 2020-09-06 DIAGNOSIS — K219 Gastro-esophageal reflux disease without esophagitis: Secondary | ICD-10-CM

## 2020-09-06 DIAGNOSIS — R0789 Other chest pain: Secondary | ICD-10-CM | POA: Diagnosis not present

## 2020-09-06 NOTE — Progress Notes (Signed)
   Subjective:   Patient ID: Sara Hunt, female    DOB: 1975-05-08, 45 y.o.   MRN: [id number]  Gastroesophageal Reflux She complains of chest pain. She reports no abdominal pain, no coughing or no nausea.  The patient is a 45 YO female coming in for atypical chest pain. Went on about 3-4 days then she started nexium daily. This has helped and she currently has no chest pain. It did go into the left arm so she wanted to get checked. She had eaten some spicy food and drank some OJ that day it started. Denies missing medications lately.   Review of Systems  Constitutional: Negative.   HENT: Negative.    Eyes: Negative.   Respiratory:  Negative for cough, chest tightness and shortness of breath.   Cardiovascular:  Positive for chest pain. Negative for palpitations and leg swelling.  Gastrointestinal:  Negative for abdominal distention, abdominal pain, constipation, diarrhea, nausea and vomiting.  Musculoskeletal: Negative.   Skin: Negative.   Neurological: Negative.   Psychiatric/Behavioral: Negative.     Objective:  Physical Exam Constitutional:      Appearance: She is well-developed. She is obese.  HENT:     Head: Normocephalic and atraumatic.  Cardiovascular:     Rate and Rhythm: Normal rate and regular rhythm.  Pulmonary:     Effort: Pulmonary effort is normal. No respiratory distress.     Breath sounds: Normal breath sounds. No wheezing or rales.  Abdominal:     General: Bowel sounds are normal. There is no distension.     Palpations: Abdomen is soft.     Tenderness: There is no abdominal tenderness. There is no rebound.  Musculoskeletal:     Cervical back: Normal range of motion.  Skin:    General: Skin is warm and dry.  Neurological:     Mental Status: She is alert and oriented to person, place, and time.     Coordination: Coordination abnormal.     Comments: wheelchair    Vitals:   09/06/20 0956  BP: 140/80  Pulse: 73  Resp: 18  SpO2: 97%  Height: '5\' 6"'$  (1.676  m)   EKG: Rate 66, axis normal, interval normal, sinus, no st or t wave changes, no significant change compared to prior 2019  This visit occurred during the SARS-CoV-2 public health emergency.  Safety protocols were in place, including screening questions prior to the visit, additional usage of staff PPE, and extensive cleaning of exam room while observing appropriate contact time as indicated for disinfecting solutions.   Assessment & Plan:

## 2020-09-06 NOTE — Patient Instructions (Addendum)
We have done the EKG which is normal.

## 2020-09-06 NOTE — Assessment & Plan Note (Signed)
BP at goal and EKG without changes of LVH.

## 2020-09-06 NOTE — Assessment & Plan Note (Signed)
Okay to take nexium for 1-2 weeks then try stopping. If recurrent symptoms try pepcid daily for long term usage. EKG done today normal to rule out cardiac etiology to her atypical chest pain.

## 2020-09-06 NOTE — Assessment & Plan Note (Signed)
EKG done to rule out ischemic changes which is normal and not changed from prior. Advised to use nexium daily for 1-2 weeks and then stop. If recurrent symptoms try pepcid daily.

## 2020-09-10 ENCOUNTER — Encounter: Payer: Self-pay | Admitting: Internal Medicine

## 2020-09-12 NOTE — Telephone Encounter (Signed)
Letter done in chart, can you fax (can be done electronically). Then let patient know.

## 2020-09-30 ENCOUNTER — Telehealth: Payer: Self-pay | Admitting: Internal Medicine

## 2020-09-30 NOTE — Telephone Encounter (Signed)
Patient says she had been given the wrong # from her insurance provider to fac over rx for wheel chair  Patient called in w/ correct phone #:  ATTN: DME [phone]

## 2020-10-09 ENCOUNTER — Encounter (INDEPENDENT_AMBULATORY_CARE_PROVIDER_SITE_OTHER): Payer: Self-pay

## 2020-10-11 NOTE — Telephone Encounter (Signed)
Note has been re-faxed to the number provided below

## 2020-10-25 ENCOUNTER — Telehealth: Payer: Self-pay | Admitting: Internal Medicine

## 2020-10-25 NOTE — Telephone Encounter (Signed)
See below

## 2020-10-25 NOTE — Telephone Encounter (Signed)
Elias Else from [employer] is requesting a rx for patient wheelchair  Natsuko states letter request for wheelchair has been received but a rx is needed to proceed in getting patients' wheelchair  Maddi is requesting the rx be faxed to [place] [phone]

## 2020-10-28 NOTE — Telephone Encounter (Signed)
Rx done and on your desk [place] to fax.

## 2020-10-29 NOTE — Telephone Encounter (Signed)
Rx has been faxed to [facility]. Confirmation fax has been received.

## 2020-11-20 ENCOUNTER — Encounter: Payer: Self-pay | Admitting: Internal Medicine

## 2020-12-18 ENCOUNTER — Other Ambulatory Visit: Payer: Self-pay | Admitting: Internal Medicine

## 2020-12-19 ENCOUNTER — Encounter: Payer: Self-pay | Admitting: Internal Medicine

## 2021-01-02 ENCOUNTER — Other Ambulatory Visit: Payer: Self-pay

## 2021-01-02 ENCOUNTER — Ambulatory Visit: Payer: 59 | Admitting: Internal Medicine

## 2021-01-02 ENCOUNTER — Encounter: Payer: Self-pay | Admitting: Internal Medicine

## 2021-01-02 DIAGNOSIS — I10 Essential (primary) hypertension: Secondary | ICD-10-CM

## 2021-01-02 NOTE — Progress Notes (Signed)
° °  Subjective:   Patient ID: Sara Hunt, female    DOB: 08-28-1975, 45 y.o.   MRN: [id number]  HPI The patient is a 45 YO female coming in for BP concerns.   Review of Systems  Constitutional: Negative.   HENT: Negative.    Eyes: Negative.   Respiratory:  Negative for cough, chest tightness and shortness of breath.   Cardiovascular:  Negative for chest pain, palpitations and leg swelling.  Gastrointestinal:  Negative for abdominal distention, abdominal pain, constipation, diarrhea, nausea and vomiting.  Musculoskeletal:  Positive for gait problem.  Skin: Negative.   Psychiatric/Behavioral: Negative.     Objective:  Physical Exam Constitutional:      Appearance: She is well-developed.  HENT:     Head: Normocephalic and atraumatic.  Cardiovascular:     Rate and Rhythm: Normal rate and regular rhythm.  Pulmonary:     Effort: Pulmonary effort is normal. No respiratory distress.     Breath sounds: Normal breath sounds. No wheezing or rales.  Abdominal:     General: Bowel sounds are normal. There is no distension.     Palpations: Abdomen is soft.     Tenderness: There is no abdominal tenderness. There is no rebound.  Musculoskeletal:     Cervical back: Normal range of motion.  Skin:    General: Skin is warm and dry.  Neurological:     Mental Status: She is alert and oriented to person, place, and time.     Coordination: Coordination abnormal.     Comments: Manual wheelchair    Vitals:   01/02/21 0825  BP: 134/80  Pulse: 75  Resp: 18  SpO2: 97%  Height: 5\' 6"  (1.676 m)    This visit occurred during the SARS-CoV-2 public health emergency.  Safety protocols were in place, including screening questions prior to the visit, additional usage of staff PPE, and extensive cleaning of exam room while observing appropriate contact time as indicated for disinfecting solutions.   Assessment & Plan:

## 2021-01-02 NOTE — Patient Instructions (Signed)
Take an extra 1/2 atenolol at least 1 hour before the dentist.

## 2021-01-03 ENCOUNTER — Encounter: Payer: Self-pay | Admitting: Internal Medicine

## 2021-01-03 NOTE — Assessment & Plan Note (Signed)
BP at goal on current regimen metoprolol 50 mg daily which also prevents SVT. Continue and advised to take an extra 1/2 to 1 tablet day of dental visits to help prevent elevated readings there.

## 2021-01-23 ENCOUNTER — Encounter: Payer: Self-pay | Admitting: Internal Medicine

## 2021-01-27 NOTE — Telephone Encounter (Signed)
Per [place], CMA form has been faxed back

## 2021-02-18 ENCOUNTER — Telehealth: Payer: Self-pay

## 2021-02-18 NOTE — Telephone Encounter (Signed)
Pt is calling about Elevated BP when going to the dentist. BP today was 178/115 at the dentist. Pt states that she is extremely nervious when going to the dentist and BP always returns to baseline but they wanted her to follow up with her PCP.  Pt CB [phone]

## 2021-02-21 ENCOUNTER — Telehealth: Payer: Self-pay | Admitting: Internal Medicine

## 2021-02-21 NOTE — Telephone Encounter (Signed)
Pt is calling to follow up on BP. Pt does report a headache.

## 2021-02-21 NOTE — Telephone Encounter (Signed)
Pt inquiring if it's ok for her to take Tylenol sinus and headache w/ high bp  Please call pt

## 2021-02-21 NOTE — Telephone Encounter (Signed)
Spoke with the pt and she stated that she did not take the extra 1/2 atenolol 1 hour prior to going to the dentist. Pt stated that she feels fine now and that headache has subsided. I advised the pt that if anything changes to give our office a call back. No other questions or concerns at this time.

## 2021-02-24 ENCOUNTER — Encounter: Payer: Self-pay | Admitting: Internal Medicine

## 2021-02-24 NOTE — Telephone Encounter (Signed)
Patient scheduled for 02/25/21

## 2021-02-24 NOTE — Telephone Encounter (Signed)
Please advise due to provider being out of office

## 2021-02-24 NOTE — Telephone Encounter (Signed)
Left message for patient to call the office to schedule an appointment

## 2021-02-24 NOTE — Telephone Encounter (Signed)
Ideally should check for covid at home, then ROV   thanks

## 2021-02-24 NOTE — Telephone Encounter (Signed)
Pt was given options to schedule appt and those were declined due to not fitting work schedule. Pt states that she will just go to an urgent care due to the congestion its hard to breath. Pt also states she has high BP so she cant take any OTC medications for this symptoms.  FYI

## 2021-02-24 NOTE — Telephone Encounter (Signed)
Connected to [facility] 2.18.2023.  Caller states has sinus infection and cannot take much because of high blood pressure. Currently has some drainage and sinus headaches. Requesting an antibiotic like last time.

## 2021-02-25 ENCOUNTER — Ambulatory Visit: Payer: 59 | Admitting: Internal Medicine

## 2021-03-18 ENCOUNTER — Other Ambulatory Visit: Payer: Self-pay | Admitting: Internal Medicine

## 2021-03-20 ENCOUNTER — Other Ambulatory Visit: Payer: Self-pay | Admitting: Internal Medicine

## 2021-03-28 ENCOUNTER — Telehealth: Payer: 59 | Admitting: Family Medicine

## 2021-03-28 DIAGNOSIS — J0101 Acute recurrent maxillary sinusitis: Secondary | ICD-10-CM | POA: Diagnosis not present

## 2021-03-28 MED ORDER — AMOXICILLIN-POT CLAVULANATE 875-125 MG PO TABS: 1.0000 | ORAL_TABLET | Freq: Two times a day (BID) | ORAL | 0 refills | Status: AC

## 2021-03-28 NOTE — Patient Instructions (Signed)

## 2021-03-28 NOTE — Progress Notes (Signed)
?Virtual Visit Consent  ? ?Sara Hunt, you are scheduled for a virtual visit with a [facility] provider today.   ?  ?Just as with appointments in the office, your consent must be obtained to participate.  Your consent will be active for this visit and any virtual visit you may have with one of our providers in the next 365 days.   ?  ?If you have a MyChart account, a copy of this consent can be sent to you electronically.  All virtual visits are billed to your insurance company just like a traditional visit in the office.   ? ?As this is a virtual visit, video technology does not allow for your provider to perform a traditional examination.  This may limit your provider's ability to fully assess your condition.  If your provider identifies any concerns that need to be evaluated in person or the need to arrange testing (such as labs, EKG, etc.), we will make arrangements to do so.   ?  ?Although advances in technology are sophisticated, we cannot ensure that it will always work on either your end or our end.  If the connection with a video visit is poor, the visit may have to be switched to a telephone visit.  With either a video or telephone visit, we are not always able to ensure that we have a secure connection.    ? ?I need to obtain your verbal consent now.   Are you willing to proceed with your visit today?  ?  ?Sara Hunt has provided verbal consent on 03/28/2021 for a virtual visit (video or telephone). ?  ?Dellia Nims, FNP  ? ?Date: 03/28/2021 6:55 PM ? ? ?Virtual Visit via Video Note  ? ?IDellia Nims, connected with  Sara Hunt  ([id number], Dec 17, 1975) on 03/28/21 at  6:45 PM EDT by a video-enabled telemedicine application and verified that I am speaking with the correct person using two identifiers. ? ?Location: ?Patient: Virtual Visit Location Patient: Home ?Provider: Virtual Visit Location Provider: Home Office ?  ?I discussed the limitations of evaluation and management by telemedicine and the  availability of in person appointments. The patient expressed understanding and agreed to proceed.   ? ?History of Present Illness: ?Sara Hunt is a 46 y.o. who identifies as a female who was assigned female at birth, and is being seen today for sinus congestion with facial pressure and pain for over 6 days. No fever. Started left over amoxil and that has helped. Covid testing at home neg.  ? ?HPI: Sinusitis ?This is a recurrent problem. The current episode started in the past 7 days. The problem has been gradually improving since onset. There has been no fever. The pain is moderate. Associated symptoms include congestion and sinus pressure. The treatment provided moderate relief.   ?Problems:  ?Patient Active Problem List  ? Diagnosis Date Noted  ? Atypical chest pain 09/06/2020  ? Acute sinus infection 07/03/2020  ? Acute bacterial middle ear infection 04/16/2020  ? Essential hypertension   ? Neurodegenerative gait disorder 09/28/2012  ? PSVT (paroxysmal supraventricular tachycardia) ([place])   ? Routine health maintenance 02/27/2011  ? Vitamin D deficiency 09/07/2010  ? Obesity 10/15/2009  ? GERD 10/15/2009  ? Hyperlipidemia 07/12/2009  ?  ?Allergies:  ?Allergies  ?Allergen Reactions  ? Latex Rash  ? ?Medications:  ?Current Outpatient Medications:  ?  amoxicillin-clavulanate (AUGMENTIN) 875-125 MG tablet, Take 1 tablet by mouth 2 (two) times daily for 10 days., Disp:  20 tablet, Rfl: 0 ?  ibuprofen (ADVIL,MOTRIN) 600 MG tablet, Take 1 tablet (600 mg total) by mouth every 6 (six) hours as needed for moderate pain., Disp: 30 tablet, Rfl: 0 ?  Menthol, Topical Analgesic, (BENGAY EX), Apply 1 application topically daily as needed (muscle pain)., Disp: , Rfl:  ?  metoprolol succinate (TOPROL-XL) 50 MG 24 hr tablet, Take 1 tablet by mouth once daily, Disp: 30 tablet, Rfl: 0 ?  Multiple Vitamin (MULTIVITAMIN WITH MINERALS) TABS tablet, Take 1 tablet by mouth daily., Disp: , Rfl:  ?  rosuvastatin (CRESTOR) 10 MG tablet,  Take 1 tablet by mouth once daily, Disp: 90 tablet, Rfl: 0 ?  Vitamin D, Ergocalciferol, (DRISDOL) 1.25 MG (50000 UNIT) CAPS capsule, Take 1 capsule (50,000 Units total) by mouth every 7 (seven) days. (Patient not taking: Reported on 09/06/2020), Disp: 12 capsule, Rfl: 3 ? ?Observations/Objective: ?Patient is well-developed, well-nourished in no acute distress.  ?Resting comfortably at home.  ?Head is normocephalic, atraumatic.  ?No labored breathing.  ?Speech is clear and coherent with logical content.  ?Patient is alert and oriented at baseline.  ? ? ?Assessment and Plan: ?1. Acute recurrent maxillary sinusitis ? ?Increase fluids, ibuprofen as directed, follow up at urgent care as needed.  ? ?Follow Up Instructions: ?I discussed the assessment and treatment plan with the patient. The patient was provided an opportunity to ask questions and all were answered. The patient agreed with the plan and demonstrated an understanding of the instructions.  A copy of instructions were sent to the patient via MyChart unless otherwise noted below.  ? ? ? ?The patient was advised to call back or seek an in-person evaluation if the symptoms worsen or if the condition fails to improve as anticipated. ? ?Time:  ?I spent 10 minutes with the patient via telehealth technology discussing the above problems/concerns.   ? ?Dellia Nims, FNP  ?

## 2021-04-10 ENCOUNTER — Encounter: Payer: 59 | Admitting: Internal Medicine

## 2021-04-13 ENCOUNTER — Other Ambulatory Visit: Payer: Self-pay | Admitting: Internal Medicine

## 2021-04-14 ENCOUNTER — Encounter: Payer: 59 | Admitting: Internal Medicine

## 2021-04-28 ENCOUNTER — Encounter: Payer: 59 | Admitting: Internal Medicine

## 2021-05-17 ENCOUNTER — Other Ambulatory Visit: Payer: Self-pay | Admitting: Internal Medicine

## 2021-05-30 ENCOUNTER — Encounter: Payer: Self-pay | Admitting: Internal Medicine

## 2021-05-30 ENCOUNTER — Ambulatory Visit (INDEPENDENT_AMBULATORY_CARE_PROVIDER_SITE_OTHER): Payer: 59 | Admitting: Internal Medicine

## 2021-05-30 VITALS — BP 130/70 | HR 85 | Resp 18 | Ht 66.0 in

## 2021-05-30 DIAGNOSIS — Z1211 Encounter for screening for malignant neoplasm of colon: Secondary | ICD-10-CM | POA: Diagnosis not present

## 2021-05-30 DIAGNOSIS — J309 Allergic rhinitis, unspecified: Secondary | ICD-10-CM | POA: Insufficient documentation

## 2021-05-30 DIAGNOSIS — J3089 Other allergic rhinitis: Secondary | ICD-10-CM | POA: Diagnosis not present

## 2021-05-30 DIAGNOSIS — Z0000 Encounter for general adult medical examination without abnormal findings: Secondary | ICD-10-CM

## 2021-05-30 DIAGNOSIS — E782 Mixed hyperlipidemia: Secondary | ICD-10-CM | POA: Diagnosis not present

## 2021-05-30 DIAGNOSIS — I10 Essential (primary) hypertension: Secondary | ICD-10-CM

## 2021-05-30 LAB — CBC
HCT: 37 % (ref 36.0–46.0)
Hemoglobin: 12.4 g/dL (ref 12.0–15.0)
MCHC: 33.4 g/dL (ref 30.0–36.0)
MCV: 94.3 fl (ref 78.0–100.0)
Platelets: 222 10*3/uL (ref 150.0–400.0)
RBC: 3.93 Mil/uL (ref 3.87–5.11)
RDW: 12.3 % (ref 11.5–15.5)
WBC: 6.5 10*3/uL (ref 4.0–10.5)

## 2021-05-30 LAB — COMPREHENSIVE METABOLIC PANEL
ALT: 16 U/L (ref 0–35)
AST: 19 U/L (ref 0–37)
Albumin: 3.8 g/dL (ref 3.5–5.2)
Alkaline Phosphatase: 66 U/L (ref 39–117)
BUN: 12 mg/dL (ref 6–23)
CO2: 33 mEq/L — ABNORMAL HIGH (ref 19–32)
Calcium: 10 mg/dL (ref 8.4–10.5)
Chloride: 101 mEq/L (ref 96–112)
Creatinine, Ser: 0.76 mg/dL (ref 0.40–1.20)
GFR: 94.79 mL/min (ref >60.00)
Glucose, Bld: 89 mg/dL (ref 70–99)
Potassium: 3.9 mEq/L (ref 3.5–5.1)
Sodium: 136 mEq/L (ref 135–145)
Total Bilirubin: 0.5 mg/dL (ref 0.2–1.2)
Total Protein: 7.8 g/dL (ref 6.0–8.3)

## 2021-05-30 LAB — LIPID PANEL
Cholesterol: 195 mg/dL (ref 0–200)
HDL: 37.2 mg/dL — ABNORMAL LOW (ref >39.00)
LDL Cholesterol: 141 mg/dL — ABNORMAL HIGH (ref 0–99)
NonHDL: 157.92
Total CHOL/HDL Ratio: 5
Triglycerides: 87 mg/dL (ref 0.0–149.0)
VLDL: 17.4 mg/dL (ref 0.0–40.0)

## 2021-05-30 LAB — HEMOGLOBIN A1C: Hgb A1c MFr Bld: 5.1 % (ref 4.6–6.5)

## 2021-05-30 NOTE — Assessment & Plan Note (Signed)
Flu shot yearly. Covid-19 counseled. Tetanus up to date. Cologuard ordered. Pap smear not indicated. Counseled about sun safety and mole surveillance. Counseled about the dangers of distracted driving. Given 10 year screening recommendations.

## 2021-05-30 NOTE — Assessment & Plan Note (Signed)
Wishes allergy testing so refer to allergy for this. She gets overly dried out with claritin but has some chronic sinus pressure. No infection today.

## 2021-05-30 NOTE — Assessment & Plan Note (Signed)
Checking lipid panel and adjust crestor 10 mg daily as needed. 

## 2021-05-30 NOTE — Assessment & Plan Note (Signed)
BP at goal on recheck. Will continue her metoprolol 50 mg daily. Checking CMP and adjust as needed.

## 2021-05-30 NOTE — Progress Notes (Signed)
   Subjective:   Patient ID: Sara Hunt, female    DOB: 1975-06-21, 46 y.o.   MRN: [id number]  HPI The patient is here for physical.  PMH, [facility], social history reviewed and updated  Review of Systems  Constitutional: Negative.   HENT: Negative.    Eyes: Negative.   Respiratory:  Negative for cough, chest tightness and shortness of breath.   Cardiovascular:  Negative for chest pain, palpitations and leg swelling.  Gastrointestinal:  Negative for abdominal distention, abdominal pain, constipation, diarrhea, nausea and vomiting.  Musculoskeletal: Negative.   Skin: Negative.   Neurological: Negative.   Psychiatric/Behavioral: Negative.     Objective:  Physical Exam Constitutional:      Appearance: She is well-developed.  HENT:     Head: Normocephalic and atraumatic.  Cardiovascular:     Rate and Rhythm: Normal rate and regular rhythm.  Pulmonary:     Effort: Pulmonary effort is normal. No respiratory distress.     Breath sounds: Normal breath sounds. No wheezing or rales.  Abdominal:     General: Bowel sounds are normal. There is no distension.     Palpations: Abdomen is soft.     Tenderness: There is no abdominal tenderness. There is no rebound.  Musculoskeletal:     Cervical back: Normal range of motion.  Skin:    General: Skin is warm and dry.  Neurological:     Mental Status: She is alert and oriented to person, place, and time.     Coordination: Coordination normal.    Vitals:   05/30/21 1434 05/30/21 1506  BP: 138/90 130/70  Pulse: 85   Resp: 18   SpO2: 91%   Height: '5\' 6"'$  (1.676 m)     This visit occurred during the SARS-CoV-2 public health emergency.  Safety protocols were in place, including screening questions prior to the visit, additional usage of staff PPE, and extensive cleaning of exam room while observing appropriate contact time as indicated for disinfecting solutions.   Assessment & Plan:

## 2021-06-13 ENCOUNTER — Other Ambulatory Visit: Payer: Self-pay | Admitting: Internal Medicine

## 2021-07-23 ENCOUNTER — Ambulatory Visit: Payer: Self-pay | Admitting: Allergy

## 2021-07-26 ENCOUNTER — Other Ambulatory Visit: Payer: Self-pay | Admitting: Internal Medicine

## 2021-10-01 ENCOUNTER — Ambulatory Visit: Payer: 59 | Admitting: Internal Medicine

## 2021-10-01 VITALS — BP 152/92 | HR 86 | Temp 98.6°F

## 2021-10-01 DIAGNOSIS — I10 Essential (primary) hypertension: Secondary | ICD-10-CM | POA: Diagnosis not present

## 2021-10-01 DIAGNOSIS — R5383 Other fatigue: Secondary | ICD-10-CM

## 2021-10-01 DIAGNOSIS — E559 Vitamin D deficiency, unspecified: Secondary | ICD-10-CM

## 2021-10-01 LAB — COMPREHENSIVE METABOLIC PANEL
ALT: 14 U/L (ref 0–35)
AST: 17 U/L (ref 0–37)
Albumin: 3.7 g/dL (ref 3.5–5.2)
Alkaline Phosphatase: 60 U/L (ref 39–117)
BUN: 15 mg/dL (ref 6–23)
CO2: 31 mEq/L (ref 19–32)
Calcium: 9.7 mg/dL (ref 8.4–10.5)
Chloride: 102 mEq/L (ref 96–112)
Creatinine, Ser: 0.86 mg/dL (ref 0.40–1.20)
GFR: 81.53 mL/min (ref >60.00)
Glucose, Bld: 105 mg/dL — ABNORMAL HIGH (ref 70–99)
Potassium: 4.1 mEq/L (ref 3.5–5.1)
Sodium: 136 mEq/L (ref 135–145)
Total Bilirubin: 0.5 mg/dL (ref 0.2–1.2)
Total Protein: 7.6 g/dL (ref 6.0–8.3)

## 2021-10-01 LAB — CBC
HCT: 36.4 % (ref 36.0–46.0)
Hemoglobin: 12.1 g/dL (ref 12.0–15.0)
MCHC: 33.3 g/dL (ref 30.0–36.0)
MCV: 94.4 fl (ref 78.0–100.0)
Platelets: 215 10*3/uL (ref 150.0–400.0)
RBC: 3.85 Mil/uL — ABNORMAL LOW (ref 3.87–5.11)
RDW: 12 % (ref 11.5–15.5)
WBC: 6.3 10*3/uL (ref 4.0–10.5)

## 2021-10-01 LAB — VITAMIN B12: Vitamin B-12: 202 pg/mL — ABNORMAL LOW (ref 211–911)

## 2021-10-01 LAB — VITAMIN D 25 HYDROXY (VIT D DEFICIENCY, FRACTURES): VITD: 28.19 ng/mL — ABNORMAL LOW (ref 30.00–100.00)

## 2021-10-01 LAB — TSH: TSH: 2.2 u[IU]/mL (ref 0.35–5.50)

## 2021-10-01 NOTE — Patient Instructions (Signed)
We will check the labs today. 

## 2021-10-01 NOTE — Progress Notes (Signed)
   Subjective:   Patient ID: Sara Hunt, female    DOB: [birth year], 46 y.o.   MRN: [id number]  HPI The patient is a 46 YO female coming in for acute concerns.  Review of Systems  Constitutional:  Positive for fatigue.  HENT: Negative.         Dry mouth  Eyes: Negative.   Respiratory:  Negative for cough, chest tightness and shortness of breath.   Cardiovascular:  Negative for chest pain, palpitations and leg swelling.  Gastrointestinal:  Negative for abdominal distention, abdominal pain, constipation, diarrhea, nausea and vomiting.  Musculoskeletal: Negative.   Skin: Negative.   Neurological:  Positive for dizziness.  Psychiatric/Behavioral: Negative.      Objective:  Physical Exam Constitutional:      Appearance: She is well-developed. She is obese.  HENT:     Head: Normocephalic and atraumatic.  Cardiovascular:     Rate and Rhythm: Normal rate and regular rhythm.  Pulmonary:     Effort: Pulmonary effort is normal. No respiratory distress.     Breath sounds: Normal breath sounds. No wheezing or rales.  Abdominal:     General: Bowel sounds are normal. There is no distension.     Palpations: Abdomen is soft.     Tenderness: There is no abdominal tenderness. There is no rebound.  Musculoskeletal:     Cervical back: Normal range of motion.  Skin:    General: Skin is warm and dry.  Neurological:     Mental Status: She is alert and oriented to person, place, and time.     Coordination: Coordination abnormal.     Vitals:   10/01/21 0942 10/01/21 0958  BP: (!) 152/102 (!) 152/92  Pulse: 86   Temp: 98.6 F (37 C)   SpO2: 96%     Assessment & Plan:

## 2021-10-03 ENCOUNTER — Encounter: Payer: Self-pay | Admitting: Internal Medicine

## 2021-10-03 NOTE — Assessment & Plan Note (Signed)
BP normal at home. Taking metoprolol 50 mg daily. She will monitor 2-3 times a week and let us know BP readings. Adjust as needed with those.

## 2021-10-03 NOTE — Assessment & Plan Note (Signed)
Checking vitamin D today. She is having symptoms similar to prior severe deficiency.

## 2021-10-03 NOTE — Assessment & Plan Note (Signed)
Checking CBC, CMP, TSH, vitamin B12 and D. Adjust as needed. Prior Hga1c normal within 6 months without significant change. She is under a lot of stress and not sleeping/eating well which may be contributing. Treat as appropriate.

## 2021-10-06 ENCOUNTER — Other Ambulatory Visit: Payer: Self-pay | Admitting: Internal Medicine

## 2021-10-20 ENCOUNTER — Other Ambulatory Visit: Payer: Self-pay | Admitting: Internal Medicine

## 2021-11-09 ENCOUNTER — Telehealth: Payer: 59 | Admitting: Family

## 2021-11-09 DIAGNOSIS — B9689 Other specified bacterial agents as the cause of diseases classified elsewhere: Secondary | ICD-10-CM

## 2021-11-09 DIAGNOSIS — N760 Acute vaginitis: Secondary | ICD-10-CM | POA: Diagnosis not present

## 2021-11-09 MED ORDER — METRONIDAZOLE 500 MG PO TABS: 500.0000 mg | ORAL_TABLET | Freq: Two times a day (BID) | ORAL | 0 refills | Status: DC

## 2021-11-09 NOTE — Progress Notes (Signed)
Virtual Visit Consent   Sara Hunt, you are scheduled for a virtual visit with a [facility] provider today. Just as with appointments in the office, your consent must be obtained to participate. Your consent will be active for this visit and any virtual visit you may have with one of our providers in the next 365 days. If you have a MyChart account, a copy of this consent can be sent to you electronically.  As this is a virtual visit, video technology does not allow for your provider to perform a traditional examination. This may limit your provider's ability to fully assess your condition. If your provider identifies any concerns that need to be evaluated in person or the need to arrange testing (such as labs, EKG, etc.), we will make arrangements to do so. Although advances in technology are sophisticated, we cannot ensure that it will always work on either your end or our end. If the connection with a video visit is poor, the visit may have to be switched to a telephone visit. With either a video or telephone visit, we are not always able to ensure that we have a secure connection.  By engaging in this virtual visit, you consent to the provision of healthcare and authorize for your insurance to be billed (if applicable) for the services provided during this visit. Depending on your insurance coverage, you may receive a charge related to this service.  I need to obtain your verbal consent now. Are you willing to proceed with your visit today? JESSIE COWHER has provided verbal consent on 11/09/2021 for a virtual visit (video or telephone). Evelina Dun, FNP  Date: 11/09/2021 5:01 PM  Virtual Visit via Video Note   I, Evelina Dun, connected with  Sara Hunt  ([id number], [birth year]) on 11/09/21 at  5:00 PM EST by a video-enabled telemedicine application and verified that I am speaking with the correct person using two identifiers.  Location: Patient: Virtual Visit Location Patient:  Home Provider: Virtual Visit Location Provider: Home Office   I discussed the limitations of evaluation and management by telemedicine and the availability of in person appointments. The patient expressed understanding and agreed to proceed.    History of Present Illness: Sara Hunt is a 46 y.o. who identifies as a female who was assigned female at birth, and is being seen today for vaginal discharge.  HPI: Vaginal Discharge The patient's primary symptoms include genital itching, a genital odor, pelvic pain and vaginal discharge. The patient's pertinent negatives include no vaginal bleeding. This is a new problem. The current episode started 1 to 4 weeks ago. The problem has been waxing and waning. Associated symptoms include dysuria. Pertinent negatives include no back pain, chills, constipation, diarrhea, discolored urine, fever, flank pain or frequency. The vaginal discharge was clear.    Problems:  Patient Active Problem List   Diagnosis Date Noted   Allergic rhinitis due to allergen 05/30/2021   Other fatigue 01/07/2018   Essential hypertension    Neurodegenerative gait disorder 09/28/2012   PSVT (paroxysmal supraventricular tachycardia)    Routine health maintenance 02/27/2011   Vitamin D deficiency 09/07/2010   Obesity 10/15/2009   Hyperlipidemia 07/12/2009    Allergies:  Allergies  Allergen Reactions   Latex Rash   Medications:  Current Outpatient Medications:    metroNIDAZOLE (FLAGYL) 500 MG tablet, Take 1 tablet (500 mg total) by mouth 2 (two) times daily., Disp: 14 tablet, Rfl: 0   ibuprofen (ADVIL,MOTRIN) 600 MG tablet, Take 1  tablet (600 mg total) by mouth every 6 (six) hours as needed for moderate pain., Disp: 30 tablet, Rfl: 0   Menthol, Topical Analgesic, (BENGAY EX), Apply 1 application topically daily as needed (muscle pain)., Disp: , Rfl:    metoprolol succinate (TOPROL-XL) 50 MG 24 hr tablet, Take 1 tablet by mouth once daily, Disp: 90 tablet, Rfl: 2    Multiple Vitamin (MULTIVITAMIN WITH MINERALS) TABS tablet, Take 1 tablet by mouth daily., Disp: , Rfl:    rosuvastatin (CRESTOR) 10 MG tablet, Take 1 tablet by mouth once daily, Disp: 30 tablet, Rfl: 0  Observations/Objective: Patient is well-developed, well-nourished in no acute distress.  Resting comfortably  at home.  Head is normocephalic, atraumatic.  No labored breathing.  Speech is clear and coherent with logical content.  Patient is alert and oriented at baseline.    Assessment and Plan: 1. Vaginal bacteriosis - metroNIDAZOLE (FLAGYL) 500 MG tablet; Take 1 tablet (500 mg total) by mouth 2 (two) times daily.  Dispense: 14 tablet; Refill: 0  Keep clean and dry If symptoms worsen or do not improve needs to be seen in person for further testing Avoid harsh detergents  Probiotic    Follow Up Instructions: I discussed the assessment and treatment plan with the patient. The patient was provided an opportunity to ask questions and all were answered. The patient agreed with the plan and demonstrated an understanding of the instructions.  A copy of instructions were sent to the patient via MyChart unless otherwise noted below.     The patient was advised to call back or seek an in-person evaluation if the symptoms worsen or if the condition fails to improve as anticipated.  Time:  I spent 12 minutes with the patient via telehealth technology discussing the above problems/concerns.    Evelina Dun, FNP

## 2021-11-21 ENCOUNTER — Other Ambulatory Visit: Payer: Self-pay | Admitting: Internal Medicine

## 2021-12-16 NOTE — Progress Notes (Unsigned)
I, Peterson Lombard, LAT, ATC acting as a scribe for Lynne Leader, MD.  Subjective:    CC: R elbow pain  HPI: Pt is a 46 y/o female c/o R elbow pain. Pt notes she fx her R elbow 10+ years ago, and will still have pain around that area intermittently. Pt hit her elbow over the weekend, when she was coming out of her room and pain has not resolved. Pt locates pain to the posterior aspect of her R elbow. 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   Paresthesia: yes Aggravates: pronation/supination, full elbow extension, pushing off w/ R arm Treatments tried: elbow brace, Tylenol, IBU  Pertinent review of Systems: No fevers or chills  Relevant historical information: History of right elbow injury in the past.  Partially wheelchair confined.   Objective:    Vitals:   12/17/21 0855 12/17/21 0907  BP: (!) 198/122 (!) 200/198  Pulse: 70   SpO2: 99%    General: Well Developed, well nourished, and in no acute distress.   MSK: Right elbow: Some swelling at posterior elbow proximal ulna and olecranon area.  Tender palpation in this area.  Not particularly tender at the very tip of the elbow at the olecranon prominence. Normal elbow motion. Elbow strength is intact. Pulses cap refill and sensation are intact distally.  Lab and Radiology Results  Diagnostic Limited MSK Ultrasound of: Right elbow Posterior elbow olecranon shows a spur at the olecranon prominence at the triceps insertion site.  No obvious olecranon bursitis or significant soft tissue hypoechoic change.  Mild visible swelling overlying area of pain at proximal ulna Lateral epicondyle area looks normal to ultrasound examination. Impression: Likely contusion proximal ulna  X-ray images right elbow obtained today personally and independently interpreted No acute fractures are visible.  Osteophyte posterior elbow. Await formal radiology review   Impression and Recommendations:    Assessment and Plan: 46 y.o. female  with right elbow pain.  Patient has a history of a prior right elbow injury about 10 years ago and has had some chronic pain since.  She injured it again recently and has pain and tenderness at the proximal ulna.  Fundamentally I think she has a contusion that is acting worse than I would expect because she already has some existing pain in this area and she has to use her arms more than would be usual because she uses a wheelchair for the most part.  We discussed options.  Plan for Voltaren gel and compression with a backup plan for prednisone.  Hydrocodone is prescribed as well.  If not improving consider occupational therapy.Marland Kitchen  PDMP reviewed during this encounter. Orders Placed This Encounter  Procedures   DG ELBOW COMPLETE RIGHT (3+VIEW)    Standing Status:   Future    Number of Occurrences:   1    Standing Expiration Date:   01/17/2022    Order Specific Question:   Reason for Exam (SYMPTOM  OR DIAGNOSIS REQUIRED)    Answer:   right elbow pain    Order Specific Question:   Preferred imaging location?    Answer:   [facility]    Order Specific Question:   Is patient pregnant?    Answer:   No   [place] LIMITED JOINT SPACE STRUCTURES UP RIGHT(NO LINKED CHARGES)    Order Specific Question:   Reason for Exam (SYMPTOM  OR DIAGNOSIS REQUIRED)    Answer:   right elbow pain    Order Specific Question:  Preferred imaging location?    Answer:   [facility]   Meds ordered this encounter  Medications   predniSONE (DELTASONE) 50 MG tablet    Sig: Take 1 tablet (50 mg total) by mouth daily.    Dispense:  5 tablet    Refill:  0   HYDROcodone-acetaminophen (NORCO/VICODIN) 5-325 MG tablet    Sig: Take 1 tablet by mouth every 6 (six) hours as needed.    Dispense:  15 tablet    Refill:  0    Discussed warning signs or symptoms. Please see discharge instructions. Patient expresses understanding.   The above documentation has been reviewed and is accurate and complete  Lynne Leader, M.D.

## 2021-12-17 ENCOUNTER — Emergency Department (HOSPITAL_COMMUNITY)
Admission: EM | Admit: 2021-12-17 | Discharge: 2021-12-17 | Disposition: A | Discharge status: Home / Self Care | Payer: 59 | Attending: Student | Admitting: Student

## 2021-12-17 ENCOUNTER — Ambulatory Visit: Payer: Self-pay

## 2021-12-17 ENCOUNTER — Encounter (HOSPITAL_COMMUNITY): Payer: Self-pay

## 2021-12-17 ENCOUNTER — Ambulatory Visit (INDEPENDENT_AMBULATORY_CARE_PROVIDER_SITE_OTHER): Payer: 59

## 2021-12-17 ENCOUNTER — Other Ambulatory Visit: Payer: Self-pay

## 2021-12-17 ENCOUNTER — Ambulatory Visit: Payer: 59 | Admitting: Family Medicine

## 2021-12-17 VITALS — BP 200/198 | HR 70 | Ht 66.0 in

## 2021-12-17 DIAGNOSIS — M25521 Pain in right elbow: Secondary | ICD-10-CM

## 2021-12-17 DIAGNOSIS — Z79899 Other long term (current) drug therapy: Secondary | ICD-10-CM | POA: Diagnosis not present

## 2021-12-17 DIAGNOSIS — Z87891 Personal history of nicotine dependence: Secondary | ICD-10-CM | POA: Insufficient documentation

## 2021-12-17 DIAGNOSIS — I10 Essential (primary) hypertension: Secondary | ICD-10-CM | POA: Insufficient documentation

## 2021-12-17 DIAGNOSIS — R079 Chest pain, unspecified: Secondary | ICD-10-CM | POA: Diagnosis present

## 2021-12-17 LAB — BASIC METABOLIC PANEL
Anion gap: 6 (ref 5–15)
BUN: 14 mg/dL (ref 6–20)
CO2: 26 mmol/L (ref 22–32)
Calcium: 9.4 mg/dL (ref 8.9–10.3)
Chloride: 106 mmol/L (ref 98–111)
Creatinine, Ser: 0.75 mg/dL (ref 0.44–1.00)
GFR, Estimated: >60 mL/min (ref >60)
Glucose, Bld: 104 mg/dL — ABNORMAL HIGH (ref 70–99)
Potassium: 3.9 mmol/L (ref 3.5–5.1)
Sodium: 138 mmol/L (ref 135–145)

## 2021-12-17 LAB — CBC WITH DIFFERENTIAL/PLATELET
Abs Immature Granulocytes: 0.02 10*3/uL (ref 0.00–0.07)
Basophils Absolute: 0 10*3/uL (ref 0.0–0.1)
Basophils Relative: 0 %
Eosinophils Absolute: 0 10*3/uL (ref 0.0–0.5)
Eosinophils Relative: 0 %
HCT: 41.6 % (ref 36.0–46.0)
Hemoglobin: 13.4 g/dL (ref 12.0–15.0)
Immature Granulocytes: 0 %
Lymphocytes Relative: 20 %
Lymphs Abs: 1.9 10*3/uL (ref 0.7–4.0)
MCH: 31 pg (ref 26.0–34.0)
MCHC: 32.2 g/dL (ref 30.0–36.0)
MCV: 96.3 fL (ref 80.0–100.0)
Monocytes Absolute: 0.4 10*3/uL (ref 0.1–1.0)
Monocytes Relative: 5 %
Neutro Abs: 6.8 10*3/uL (ref 1.7–7.7)
Neutrophils Relative %: 75 %
Platelets: 261 10*3/uL (ref 150–400)
RBC: 4.32 MIL/uL (ref 3.87–5.11)
RDW: 11.8 % (ref 11.5–15.5)
WBC: 9.2 10*3/uL (ref 4.0–10.5)
nRBC: 0 % (ref 0.0–0.2)

## 2021-12-17 LAB — TROPONIN I (HIGH SENSITIVITY): Troponin I (High Sensitivity): 3 ng/L (ref <18)

## 2021-12-17 MED ORDER — PREDNISONE 50 MG PO TABS: 50.0000 mg | ORAL_TABLET | Freq: Every day | ORAL | 0 refills | Status: DC

## 2021-12-17 MED ORDER — HYDROCODONE-ACETAMINOPHEN 5-325 MG PO TABS: 1.0000 | ORAL_TABLET | Freq: Four times a day (QID) | ORAL | 0 refills | Status: DC | PRN

## 2021-12-17 MED ORDER — HYDROCHLOROTHIAZIDE 25 MG PO TABS: 12.5000 mg | ORAL_TABLET | Freq: Every day | ORAL | 0 refills | Status: DC

## 2021-12-17 MED ORDER — ALUM & MAG HYDROXIDE-SIMETH 200-200-20 MG/5ML PO SUSP
30.0000 mL | Freq: Once | ORAL | Status: AC
  Administered 2021-12-17: 30 mL via ORAL
  Filled 2021-12-17: qty 30

## 2021-12-17 MED ORDER — LIDOCAINE VISCOUS HCL 2 % MT SOLN
15.0000 mL | Freq: Once | OROMUCOSAL | Status: AC
  Administered 2021-12-17: 15 mL via ORAL
  Filled 2021-12-17: qty 15

## 2021-12-17 MED ORDER — NAPROXEN 500 MG PO TABS
500.0000 mg | ORAL_TABLET | Freq: Once | ORAL | Status: AC
  Administered 2021-12-17: 500 mg via ORAL
  Filled 2021-12-17: qty 1
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
## 2021-12-17 NOTE — Patient Instructions (Addendum)
Thank you for coming in today.   Please use Voltaren gel (Generic Diclofenac Gel) up to 4x daily for pain as needed.  This is available over-the-counter as both the name brand Voltaren gel and the generic diclofenac gel.   I recommend you obtained a compression sleeve to help with your joint problems. There are many options on the market however I recommend obtaining a full elbow Body Helix compression sleeve.  You can find information (including how to appropriate measure yourself for sizing) can be found at www.Body http://www.lambert.com/.  Many of these products are health savings account (HSA) eligible.   You can use the compression sleeve at any time throughout the day but is most important to use while being active as well as for 2 hours post-activity.   It is appropriate to ice following activity with the compression sleeve in place.   Use hydrocodone for severe pain.   Take the prednisone for 5 days.   Let me know if you would like to try Occupational therapy for this.   Read about Complex Regional Pain Syndrome

## 2021-12-17 NOTE — ED Triage Notes (Signed)
Pt was being seen at ortho and they noted her BP to being high. Pt wanted to be evaluated. No symptoms with it being high just wanted to be checked out.

## 2021-12-18 NOTE — Progress Notes (Signed)
Right elbow x-ray shows no fractures.  There is is a bone spur that we talked about in clinic but otherwise it looks okay.  There is a little bit of arthritis present.

## 2021-12-19 ENCOUNTER — Telehealth: Payer: Self-pay

## 2021-12-19 NOTE — Telephone Encounter (Signed)
Transition Care Management Follow-up Telephone Call Date of discharge and from where: 12/17/2021 How have you been since you were released from the hospital? No Any questions or concerns? No  Items Reviewed: Did the pt receive and understand the discharge instructions provided? Yes  Medications obtained and verified? Yes  Other? No  Any new allergies since your discharge? No  Dietary orders reviewed? No Do you have support at home? Yes   Home Care and Equipment/Supplies: Were home health services ordered? not applicable If so, what is the name of the agency? N/A  Has the agency set up a time to come to the patient's home? not applicable Were any new equipment or medical supplies ordered?  No What is the name of the medical supply agency? N/A Were you able to get the supplies/equipment? not applicable Do you have any questions related to the use of the equipment or supplies? No  Functional Questionnaire: (I = Independent and D = Dependent) ADLs: I  Bathing/Dressing- I  Meal Prep- I  Eating- I  Maintaining continence- I  Transferring/Ambulation- I  Managing Meds- I  Follow up appointments reviewed:  PCP Hospital f/u appt confirmed? No   Specialist Hospital f/u appt confirmed? No   Are transportation arrangements needed? No  If their condition worsens, is the pt aware to call PCP or go to the Emergency Dept.? Yes Was the patient provided with contact information for the PCP's office or ED? YES Was to pt encouraged to call back with questions or concerns? Yes

## 2022-01-20 ENCOUNTER — Encounter: Payer: Self-pay | Admitting: Internal Medicine

## 2022-01-20 ENCOUNTER — Ambulatory Visit: Payer: 59 | Admitting: Internal Medicine

## 2022-01-20 VITALS — BP 140/80 | HR 78 | Temp 98.3°F

## 2022-01-20 DIAGNOSIS — I10 Essential (primary) hypertension: Secondary | ICD-10-CM

## 2022-01-20 NOTE — Assessment & Plan Note (Signed)
BP at goal on metoprolol 50 mg daily. She did take 2 weeks of hctz 12.5 mg daily while she made dietary changes. Monitor BP at home and if elevates again can resume hctz 12.5 mg daily.

## 2022-01-20 NOTE — Progress Notes (Signed)
   Subjective:   Patient ID: Sara Hunt, female    DOB: Mar 04, 1975, 47 y.o.   MRN: [id number]  HPI The patient is a 47 YO female coming in for ER follow up. BP was high and started on hctz 12.5 mg daily for 2 weeks. Stopped about 1 week ago. Has changed diet  Review of Systems  Constitutional: Negative.   HENT: Negative.    Eyes: Negative.   Respiratory:  Negative for cough, chest tightness and shortness of breath.   Cardiovascular:  Negative for chest pain, palpitations and leg swelling.  Gastrointestinal:  Negative for abdominal distention, abdominal pain, constipation, diarrhea, nausea and vomiting.  Musculoskeletal:  Positive for gait problem.  Skin: Negative.   Psychiatric/Behavioral: Negative.      Objective:  Physical Exam Constitutional:      Appearance: She is well-developed.  HENT:     Head: Normocephalic and atraumatic.  Cardiovascular:     Rate and Rhythm: Normal rate and regular rhythm.  Pulmonary:     Effort: Pulmonary effort is normal. No respiratory distress.     Breath sounds: Normal breath sounds. No wheezing or rales.  Abdominal:     General: Bowel sounds are normal. There is no distension.     Palpations: Abdomen is soft.     Tenderness: There is no abdominal tenderness. There is no rebound.  Musculoskeletal:     Cervical back: Normal range of motion.  Skin:    General: Skin is warm and dry.  Neurological:     Mental Status: She is alert and oriented to person, place, and time. Mental status is at baseline.     Sensory: Sensory deficit present.     Motor: Weakness present.     Coordination: Coordination abnormal.     Vitals:   01/20/22 1104 01/20/22 1113  BP: (!) 140/90 (!) 140/80  Pulse: 78   Temp: 98.3 F (36.8 C)   TempSrc: Oral   SpO2: 98%     Assessment & Plan:

## 2022-01-27 ENCOUNTER — Other Ambulatory Visit: Payer: Self-pay | Admitting: Internal Medicine

## 2022-02-23 LAB — HM MAMMOGRAPHY

## 2022-02-25 ENCOUNTER — Encounter: Payer: Self-pay | Admitting: Internal Medicine

## 2022-03-13 ENCOUNTER — Other Ambulatory Visit: Payer: Self-pay | Admitting: Internal Medicine

## 2022-04-09 ENCOUNTER — Other Ambulatory Visit: Payer: Self-pay | Admitting: Internal Medicine

## 2022-04-26 ENCOUNTER — Telehealth: Payer: 59 | Admitting: Family Medicine

## 2022-04-26 DIAGNOSIS — M25521 Pain in right elbow: Secondary | ICD-10-CM

## 2022-04-26 MED ORDER — NAPROXEN 500 MG PO TABS: 500.0000 mg | ORAL_TABLET | Freq: Two times a day (BID) | ORAL | 0 refills | Status: AC

## 2022-04-26 NOTE — Progress Notes (Signed)
Virtual Visit Consent   Sara Hunt, you are scheduled for a virtual visit with a [facility] provider today. Just as with appointments in the office, your consent must be obtained to participate. Your consent will be active for this visit and any virtual visit you may have with one of our providers in the next 365 days. If you have a MyChart account, a copy of this consent can be sent to you electronically.  As this is a virtual visit, video technology does not allow for your provider to perform a traditional examination. This may limit your provider's ability to fully assess your condition. If your provider identifies any concerns that need to be evaluated in person or the need to arrange testing (such as labs, EKG, etc.), we will make arrangements to do so. Although advances in technology are sophisticated, we cannot ensure that it will always work on either your end or our end. If the connection with a video visit is poor, the visit may have to be switched to a telephone visit. With either a video or telephone visit, we are not always able to ensure that we have a secure connection.  By engaging in this virtual visit, you consent to the provision of healthcare and authorize for your insurance to be billed (if applicable) for the services provided during this visit. Depending on your insurance coverage, you may receive a charge related to this service.  I need to obtain your verbal consent now. Are you willing to proceed with your visit today? Sara Hunt has provided verbal consent on 04/26/2022 for a virtual visit (video or telephone). Georgana Curio, FNP  Date: 04/26/2022 12:48 PM  Virtual Visit via Video Note   I, Georgana Curio, connected with  Sara Hunt  ([id number], 05-15-1975) on 04/26/22 at 12:45 PM EDT by a video-enabled telemedicine application and verified that I am speaking with the correct person using two identifiers.  Location: Patient: Virtual Visit Location Patient:  Home Provider: Virtual Visit Location Provider: Home Office   I discussed the limitations of evaluation and management by telemedicine and the availability of in person appointments. The patient expressed understanding and agreed to proceed.    History of Present Illness: Sara Hunt is a 47 y.o. who identifies as a female who was assigned female at birth, and is being seen today for rt elbow pain from overuse wheeling her wheelchair this weekend. She has an old hairline fracture that flares with pain with over use. Naproxen in ED in December worked well. Marland Kitchen  HPI: HPI  Problems:  Patient Active Problem List   Diagnosis Date Noted   Allergic rhinitis due to allergen 05/30/2021   Other fatigue 01/07/2018   Essential hypertension    Neurodegenerative gait disorder 09/28/2012   PSVT (paroxysmal supraventricular tachycardia)    Routine health maintenance 02/27/2011   Vitamin D deficiency 09/07/2010   Obesity 10/15/2009   Hyperlipidemia 07/12/2009    Allergies:  Allergies  Allergen Reactions   Latex Rash   Medications:  Current Outpatient Medications:    fluconazole (DIFLUCAN) 150 MG tablet, Take by mouth., Disp: , Rfl:    HYDROcodone-acetaminophen (NORCO/VICODIN) 5-325 MG tablet, Take 1 tablet by mouth every 6 (six) hours as needed., Disp: 15 tablet, Rfl: 0   ibuprofen (ADVIL,MOTRIN) 600 MG tablet, Take 1 tablet (600 mg total) by mouth every 6 (six) hours as needed for moderate pain., Disp: 30 tablet, Rfl: 0   Menthol, Topical Analgesic, (BENGAY EX), Apply 1 application topically  daily as needed (muscle pain)., Disp: , Rfl:    metoprolol succinate (TOPROL-XL) 50 MG 24 hr tablet, Take 1 tablet by mouth once daily, Disp: 90 tablet, Rfl: 2   Multiple Vitamin (MULTIVITAMIN WITH MINERALS) TABS tablet, Take 1 tablet by mouth daily., Disp: , Rfl:    rosuvastatin (CRESTOR) 10 MG tablet, Take 1 tablet (10 mg total) by mouth daily. Annual appt due in May must see provider for future refills,  Disp: 30 tablet, Rfl: 1  Observations/Objective: Patient is well-developed, well-nourished in no acute distress.  Resting comfortably  at home.  Head is normocephalic, atraumatic.  No labored breathing.  Speech is clear and coherent with logical content.  Patient is alert and oriented at baseline.    Assessment and Plan: 1. Right elbow pain  Ice, take naproxen with food, rest arm, follow up with pcp as needed.   Follow Up Instructions: I discussed the assessment and treatment plan with the patient. The patient was provided an opportunity to ask questions and all were answered. The patient agreed with the plan and demonstrated an understanding of the instructions.  A copy of instructions were sent to the patient via MyChart unless otherwise noted below.     The patient was advised to call back or seek an in-person evaluation if the symptoms worsen or if the condition fails to improve as anticipated.  Time:  I spent 10 minutes with the patient via telehealth technology discussing the above problems/concerns.    Georgana Curio, FNP

## 2022-04-26 NOTE — Patient Instructions (Signed)
Joint Pain Joint pain may be caused by many things. Joint pain is likely to go away when you follow instructions from your health care provider for relieving pain at home. However, joint pain can also be caused by conditions that require more treatment. Common causes of joint pain include: Bruising in the area of the joint. Injury caused by repeating certain movements too many times. Age-related joint wear and tear. Buildup of uric acid crystals in the joint (gout). Inflammation of the joint. Other forms of arthritis. Infections of the joint or of the bone. Your health care provider may recommend that you take pain medicine or wear a supportive device like an elastic bandage, sling, or splint. If your joint pain continues, you may need lab or imaging tests to diagnose the cause of your joint pain. Follow these instructions at home: Managing pain, stiffness, and swelling     If directed, put ice on the painful area. To do this: If you have a removable elastic bandage, sling, or splint, take it off as told by your doctor. Put ice in a plastic bag. Place a towel between your skin and the bag. Leave the ice on for 20 minutes, 2-3 times a day. Remove the ice if your skin turns bright red. This is very important. If you cannot feel pain, heat, or cold, you have a greater risk of damage to the area. Move your fingers and toes often to reduce stiffness and swelling. Raise the injured area above the level of your heart while you are sitting or lying down. If directed, apply heat to the painful area as often as told by your health care provider. Use the heat source that your health care provider recommends, such as a moist heat pack or a heating pad. Place a towel between your skin and the heat source. Leave the heat on for 20-30 minutes. Remove the heat if your skin turns bright red. This is especially important if you are unable to feel pain, heat, or cold. You have a greater risk of getting  burned.  Activity Rest as told by your health care provider. Do not do anything that causes or worsens pain. Begin exercising or stretching the affected area as told by your health care provider. Return to your normal activities as told by your health care provider. Ask your health care provider what activities are safe for you. If you have an elastic bandage, sling, or splint: Wear the bandage, sling, or splint as told by your health care provider. Remove it only as told by your health care provider. Loosen it if your fingers or toes below the joint tingle, become numb, or turn cold and blue. Keep it clean. Ask your health care provider if you should remove it before bathing. If the bandage, sling, or splint is not waterproof: Do not let it get wet. Cover it with a watertight covering when you take a bath or shower. General instructions Treatment may include medicines for pain and inflammation that are taken by mouth or applied to the skin. Take over-the-counter and prescription medicines only as told by your health care provider. Do not use any products that contain nicotine or tobacco, such as cigarettes, e-cigarettes, and chewing tobacco. If you need help quitting, ask your health care provider. Keep all follow-up visits. This is important. Contact a health care provider if: You have pain that gets worse and does not get better with medicine. Your joint pain does not improve within 3 days. You have increased   bruising or swelling. You have a fever. You lose 10 lb (4.5 kg) or more without trying. Get help right away if: You cannot move the joint. Your fingers or toes tingle, become numb, or turn cold and blue. You have a fever along with a joint that is red, warm, and swollen. Summary Joint pain may be caused by many things. Your health care provider may recommend that you take pain medicine or wear a supportive device such as an elastic bandage, sling, or splint. If your joint pain  continues, you may need tests to diagnose the cause of your joint pain. Take over-the-counter and prescription medicines only as told by your health care provider. This information is not intended to replace advice given to you by your health care provider. Make sure you discuss any questions you have with your health care provider. Document Revised: 04/05/2019 Document Reviewed: 04/05/2019 Elsevier Patient Education  2023 Elsevier Inc.  

## 2022-04-28 ENCOUNTER — Other Ambulatory Visit: Payer: Self-pay | Admitting: Internal Medicine

## 2022-05-15 ENCOUNTER — Ambulatory Visit: Payer: 59 | Admitting: Family Medicine

## 2022-05-19 ENCOUNTER — Ambulatory Visit: Payer: 59 | Admitting: Family Medicine

## 2022-05-19 NOTE — Progress Notes (Deleted)
   Rubin Payor, PhD, LAT, ATC acting as a scribe for Clementeen Graham, MD.  Sara Hunt is a 47 y.o. female who presents to [facility] today for neck ***pain??? Pt was seen previously by Dr. Denyse Amass on 12/17/21.  Today, pt c/o ***. Pain has been ongoing for about 1 wk. She thinks that she "pulled something." Pt locates pain to ***  Radiating pain: LE numbness/tingling: LE weakness: Aggravates: Treatments tried:  Pertinent review of systems: ***  Relevant historical information: ***   Exam:  LMP 10/20/2017 (Exact Date)  General: Well Developed, well nourished, and in no acute distress.   MSK: ***    Lab and Radiology Results No results found for this or any previous visit (from the past 72 hour(s)). No results found.     Assessment and Plan: 47 y.o. female with ***   PDMP not reviewed this encounter. No orders of the defined types were placed in this encounter.  No orders of the defined types were placed in this encounter.    Discussed warning signs or symptoms. Please see discharge instructions. Patient expresses understanding.   ***

## 2022-06-22 ENCOUNTER — Other Ambulatory Visit: Payer: Self-pay | Admitting: Internal Medicine

## 2022-06-27 ENCOUNTER — Telehealth: Payer: 59 | Admitting: Nurse Practitioner

## 2022-06-27 DIAGNOSIS — K1379 Other lesions of oral mucosa: Secondary | ICD-10-CM

## 2022-06-27 NOTE — Patient Instructions (Addendum)
  Sara Hunt, thank you for joining Claiborne Rigg, NP for today's virtual visit.  While this provider is not your primary care provider (PCP), if your PCP is located in our provider database this encounter information will be shared with them immediately following your visit.   A [facility] MyChart account gives you access to today's visit and all your visits, tests, and labs performed at [facility] " click here if you don't have a [facility] MyChart account or go to [email or web address]  Consent: (Patient) Sara Hunt provided verbal consent for this virtual visit at the beginning of the encounter.  Current Medications:  Current Outpatient Medications:    fluconazole (DIFLUCAN) 150 MG tablet, Take by mouth., Disp: , Rfl:    HYDROcodone-acetaminophen (NORCO/VICODIN) 5-325 MG tablet, Take 1 tablet by mouth every 6 (six) hours as needed., Disp: 15 tablet, Rfl: 0   ibuprofen (ADVIL,MOTRIN) 600 MG tablet, Take 1 tablet (600 mg total) by mouth every 6 (six) hours as needed for moderate pain., Disp: 30 tablet, Rfl: 0   Menthol, Topical Analgesic, (BENGAY EX), Apply 1 application topically daily as needed (muscle pain)., Disp: , Rfl:    metoprolol succinate (TOPROL-XL) 50 MG 24 hr tablet, Take 1 tablet (50 mg total) by mouth daily. Annual appt due in April must see provider for future refills, Disp: 90 tablet, Rfl: 0   Multiple Vitamin (MULTIVITAMIN WITH MINERALS) TABS tablet, Take 1 tablet by mouth daily., Disp: , Rfl:    rosuvastatin (CRESTOR) 10 MG tablet, TAKE 1 TABLET BY MOUTH ONCE DAILY . APPOINTMENT REQUIRED FOR FUTURE REFILLS, Disp: 30 tablet, Rfl: 0   Medications ordered in this encounter:  No orders of the defined types were placed in this encounter.    *If you need refills on other medications prior to your next appointment, please contact your pharmacy*  Follow-Up: Call back or seek an in-person evaluation if the symptoms worsen or if the condition fails to  improve as anticipated.  [facility] [phone]  Other Instructions Follow up in office for physical exam/labs Continue benadryl prn   If you have been instructed to have an in-person evaluation today at a local Urgent Care facility, please use the link below. It will take you to a list of all of our available [facility], including address, phone number and hours of operation. Please do not delay care.  [facility]  If you or a family member do not have a primary care provider, use the link below to schedule a visit and establish care. When you choose a [facility] primary care physician or advanced practice provider, you gain a long-term partner in health. Find a Primary Care Provider  Learn more about [facility]'s in-office and virtual care options: [facility] - Get Care Now

## 2022-06-27 NOTE — Progress Notes (Signed)
Virtual Visit Consent   Sara Hunt, you are scheduled for a virtual visit with a [facility] provider today. Just as with appointments in the office, your consent must be obtained to participate. Your consent will be active for this visit and any virtual visit you may have with one of our providers in the next 365 days. If you have a MyChart account, a copy of this consent can be sent to you electronically.  As this is a virtual visit, video technology does not allow for your provider to perform a traditional examination. This may limit your provider's ability to fully assess your condition. If your provider identifies any concerns that need to be evaluated in person or the need to arrange testing (such as labs, EKG, etc.), we will make arrangements to do so. Although advances in technology are sophisticated, we cannot ensure that it will always work on either your end or our end. If the connection with a video visit is poor, the visit may have to be switched to a telephone visit. With either a video or telephone visit, we are not always able to ensure that we have a secure connection.  By engaging in this virtual visit, you consent to the provision of healthcare and authorize for your insurance to be billed (if applicable) for the services provided during this visit. Depending on your insurance coverage, you may receive a charge related to this service.  I need to obtain your verbal consent now. Are you willing to proceed with your visit today? Sara Hunt has provided verbal consent on 06/27/2022 for a virtual visit (video or telephone). Claiborne Rigg, NP  Date: 06/27/2022 12:33 PM  Virtual Visit via Video Note   I, Claiborne Rigg, connected with  [name]  ([id number], [birth year]) on 06/27/22 at 12:15 PM EDT by a video-enabled telemedicine application and verified that I am speaking with the correct person using two identifiers.  Location: Patient: Virtual Visit Location Patient:  Home Provider: Virtual Visit Location Provider: Home Office   I discussed the limitations of evaluation and management by telemedicine and the availability of in person appointments. The patient expressed understanding and agreed to proceed.    History of Present Illness: Sara Hunt is a 47 y.o. who identifies as a female who was assigned female at birth, and is being seen today for oral cavity pain.  Ms Archambeault states over the past week she has been experiencing an unusual sensation on her tongue. States it feels like the tip of her tongue has been burned and the roof of her mouth is very dry. She also notes mild tingling sensation in her lips. Took benadryl x1 which was not very effective. Denies any oral lesions, sore throat, fever, difficulty swallowing or eating spicy foods.  She does state this sensation of her tongue occurred in the past but only lasted a few days. She can not remember if she took benadryl for it at that time  Problems:  Patient Active Problem List   Diagnosis Date Noted   Allergic rhinitis due to allergen 05/30/2021   Other fatigue 01/07/2018   Essential hypertension    Neurodegenerative gait disorder 09/28/2012   PSVT (paroxysmal supraventricular tachycardia)    Routine health maintenance 02/27/2011   Vitamin D deficiency 09/07/2010   Obesity 10/15/2009   Hyperlipidemia 07/12/2009    Allergies:  Allergies  Allergen Reactions   Latex Rash   Medications:  Current Outpatient Medications:    fluconazole (DIFLUCAN) 150  MG tablet, Take by mouth., Disp: , Rfl:    HYDROcodone-acetaminophen (NORCO/VICODIN) 5-325 MG tablet, Take 1 tablet by mouth every 6 (six) hours as needed., Disp: 15 tablet, Rfl: 0   ibuprofen (ADVIL,MOTRIN) 600 MG tablet, Take 1 tablet (600 mg total) by mouth every 6 (six) hours as needed for moderate pain., Disp: 30 tablet, Rfl: 0   Menthol, Topical Analgesic, (BENGAY EX), Apply 1 application topically daily as needed (muscle pain)., Disp: ,  Rfl:    metoprolol succinate (TOPROL-XL) 50 MG 24 hr tablet, Take 1 tablet (50 mg total) by mouth daily. Annual appt due in April must see provider for future refills, Disp: 90 tablet, Rfl: 0   Multiple Vitamin (MULTIVITAMIN WITH MINERALS) TABS tablet, Take 1 tablet by mouth daily., Disp: , Rfl:    rosuvastatin (CRESTOR) 10 MG tablet, TAKE 1 TABLET BY MOUTH ONCE DAILY . APPOINTMENT REQUIRED FOR FUTURE REFILLS, Disp: 30 tablet, Rfl: 0  Observations/Objective: Patient is well-developed, well-nourished in no acute distress.  Resting comfortably at home.  Head is normocephalic, atraumatic.  No labored breathing. Speech is clear and coherent with logical content.  Patient is alert and oriented at baseline.    Assessment and Plan: 1. Oral cavity pain Follow up in office for physical exam/labs Continue benadryl prn  Follow Up Instructions: I discussed the assessment and treatment plan with the patient. The patient was provided an opportunity to ask questions and all were answered. The patient agreed with the plan and demonstrated an understanding of the instructions.  A copy of instructions were sent to the patient via MyChart unless otherwise noted below.    The patient was advised to call back or seek an in-person evaluation if the symptoms worsen or if the condition fails to improve as anticipated.  Time:  I spent 9 minutes with the patient via telehealth technology discussing the above problems/concerns.    Claiborne Rigg, NP

## 2022-06-30 ENCOUNTER — Ambulatory Visit (INDEPENDENT_AMBULATORY_CARE_PROVIDER_SITE_OTHER): Payer: 59 | Admitting: Internal Medicine

## 2022-06-30 ENCOUNTER — Encounter: Payer: Self-pay | Admitting: Internal Medicine

## 2022-06-30 VITALS — BP 158/100 | HR 77 | Temp 98.3°F | Ht 66.0 in

## 2022-06-30 DIAGNOSIS — E782 Mixed hyperlipidemia: Secondary | ICD-10-CM | POA: Diagnosis not present

## 2022-06-30 DIAGNOSIS — E559 Vitamin D deficiency, unspecified: Secondary | ICD-10-CM

## 2022-06-30 DIAGNOSIS — I10 Essential (primary) hypertension: Secondary | ICD-10-CM | POA: Diagnosis not present

## 2022-06-30 DIAGNOSIS — K146 Glossodynia: Secondary | ICD-10-CM | POA: Insufficient documentation

## 2022-06-30 LAB — LIPID PANEL
Cholesterol: 183 mg/dL (ref 0–200)
HDL: 31.6 mg/dL — ABNORMAL LOW (ref >39.00)
LDL Cholesterol: 125 mg/dL — ABNORMAL HIGH (ref 0–99)
NonHDL: 151.73
Total CHOL/HDL Ratio: 6
Triglycerides: 135 mg/dL (ref 0.0–149.0)
VLDL: 27 mg/dL (ref 0.0–40.0)

## 2022-06-30 LAB — COMPREHENSIVE METABOLIC PANEL
ALT: 27 U/L (ref 0–35)
AST: 20 U/L (ref 0–37)
Albumin: 3.9 g/dL (ref 3.5–5.2)
Alkaline Phosphatase: 63 U/L (ref 39–117)
BUN: 16 mg/dL (ref 6–23)
CO2: 30 mEq/L (ref 19–32)
Calcium: 10.4 mg/dL (ref 8.4–10.5)
Chloride: 100 mEq/L (ref 96–112)
Creatinine, Ser: 0.86 mg/dL (ref 0.40–1.20)
GFR: 81.11 mL/min (ref >60.00)
Glucose, Bld: 91 mg/dL (ref 70–99)
Potassium: 4.2 mEq/L (ref 3.5–5.1)
Sodium: 135 mEq/L (ref 135–145)
Total Bilirubin: 0.4 mg/dL (ref 0.2–1.2)
Total Protein: 8 g/dL (ref 6.0–8.3)

## 2022-06-30 LAB — VITAMIN B12: Vitamin B-12: 262 pg/mL (ref 211–911)

## 2022-06-30 LAB — CBC
HCT: 38.6 % (ref 36.0–46.0)
Hemoglobin: 12.6 g/dL (ref 12.0–15.0)
MCHC: 32.5 g/dL (ref 30.0–36.0)
MCV: 94.6 fl (ref 78.0–100.0)
Platelets: 238 10*3/uL (ref 150.0–400.0)
RBC: 4.08 Mil/uL (ref 3.87–5.11)
RDW: 12.1 % (ref 11.5–15.5)
WBC: 7.8 10*3/uL (ref 4.0–10.5)

## 2022-06-30 LAB — TSH: TSH: 1.49 u[IU]/mL (ref 0.35–5.50)

## 2022-06-30 LAB — HEMOGLOBIN A1C: Hgb A1c MFr Bld: 5.1 % (ref 4.6–6.5)

## 2022-06-30 LAB — VITAMIN D 25 HYDROXY (VIT D DEFICIENCY, FRACTURES): VITD: 19.77 ng/mL — ABNORMAL LOW (ref 30.00–100.00)

## 2022-06-30 NOTE — Assessment & Plan Note (Signed)
Checking lipid panel and adjust crestor 10 mg daily as needed.  

## 2022-06-30 NOTE — Assessment & Plan Note (Signed)
Checking TSH, B12, vitamin D, CBC, CMP, Hga1c. Treat as appropriate. If all labs normal will do oral nystatin swish and spit.

## 2022-06-30 NOTE — Assessment & Plan Note (Signed)
Checking vitamin D and adjust as needed. On otc vitamin D.

## 2022-06-30 NOTE — Patient Instructions (Addendum)
Try lemon drops to help.  We will check the labs.

## 2022-06-30 NOTE — Assessment & Plan Note (Addendum)
BP elevated initially she will monitor at home. Continue metoprolol 50 mg daily.

## 2022-06-30 NOTE — Progress Notes (Signed)
   Subjective:   Patient ID: Sara Hunt, female    DOB: July 21, 1975, 47 y.o.   MRN: [id number]  HPI The patient is a 47 YO female coming in for burning on the tongue. Tried benadryl without success. Going on 2 weeks or so. No other new symptoms and no white plaque on tongue.  Review of Systems  Constitutional: Negative.   HENT: Negative.         Tongue pain  Eyes: Negative.   Respiratory:  Negative for cough, chest tightness and shortness of breath.   Cardiovascular:  Negative for chest pain, palpitations and leg swelling.  Gastrointestinal:  Negative for abdominal distention, abdominal pain, constipation, diarrhea, nausea and vomiting.  Musculoskeletal: Negative.   Skin: Negative.   Neurological: Negative.   Psychiatric/Behavioral: Negative.      Objective:  Physical Exam Constitutional:      Appearance: She is well-developed.  HENT:     Head: Normocephalic and atraumatic.     Comments: No white plaque on tongue or oral lesions Cardiovascular:     Rate and Rhythm: Normal rate and regular rhythm.  Pulmonary:     Effort: Pulmonary effort is normal. No respiratory distress.     Breath sounds: Normal breath sounds. No wheezing or rales.  Abdominal:     General: Bowel sounds are normal. There is no distension.     Palpations: Abdomen is soft.     Tenderness: There is no abdominal tenderness. There is no rebound.  Musculoskeletal:     Cervical back: Normal range of motion.  Skin:    General: Skin is warm and dry.  Neurological:     Mental Status: She is alert and oriented to person, place, and time.     Cranial Nerves: Cranial nerve deficit present.     Coordination: Coordination abnormal.     Vitals:   06/30/22 1450 06/30/22 1454  BP: (!) 178/100 (!) 178/100  Pulse: 77   Temp: 98.3 F (36.8 C)   TempSrc: Oral   SpO2: 96%   Height: 5\' 6"  (1.676 m)     Assessment & Plan:

## 2022-07-02 ENCOUNTER — Other Ambulatory Visit: Payer: Self-pay | Admitting: Internal Medicine

## 2022-07-02 MED ORDER — VITAMIN D (ERGOCALCIFEROL) 1.25 MG (50000 UNIT) PO CAPS: 50000.0000 [IU] | ORAL_CAPSULE | ORAL | 0 refills | Status: DC

## 2022-07-27 ENCOUNTER — Other Ambulatory Visit: Payer: Self-pay

## 2022-07-27 ENCOUNTER — Other Ambulatory Visit: Payer: Self-pay | Admitting: Internal Medicine

## 2022-07-27 ENCOUNTER — Telehealth: Payer: Self-pay | Admitting: Internal Medicine

## 2022-07-27 MED ORDER — METOPROLOL SUCCINATE ER 50 MG PO TB24: 50.0000 mg | ORAL_TABLET | Freq: Every day | ORAL | 0 refills | Status: DC

## 2022-07-27 MED ORDER — ROSUVASTATIN CALCIUM 10 MG PO TABS: 10.0000 mg | ORAL_TABLET | Freq: Every day | ORAL | 3 refills | Status: DC

## 2022-07-27 NOTE — Telephone Encounter (Signed)
Done

## 2022-07-27 NOTE — Telephone Encounter (Signed)
Prescription Request  07/27/2022  LOV: 06/30/2022  What is the name of the medication or equipment?  metoprolol succinate (TOPROL-XL) 50 MG 24 hr tablet   rosuvastatin (CRESTOR) 10 MG tablet [  Have you contacted your pharmacy to request a refill? No   Which pharmacy would you like this sent to?    [facility] [address] [place], [place] - [address] [place] [address] Phone: [phone] Fax: [phone]  Patient notified that their request is being sent to the clinical staff for review and that they should receive a response within 2 business days.   Please advise at Mobile [phone] (mobile)

## 2022-09-14 ENCOUNTER — Encounter: Payer: Self-pay | Admitting: Internal Medicine

## 2022-09-21 MED ORDER — SEMAGLUTIDE-WEIGHT MANAGEMENT 0.5 MG/0.5ML ~~LOC~~ SOAJ: 0.5000 mg | SUBCUTANEOUS | 0 refills | Status: AC

## 2022-09-21 MED ORDER — SEMAGLUTIDE-WEIGHT MANAGEMENT 2.4 MG/0.75ML ~~LOC~~ SOAJ: 2.4000 mg | SUBCUTANEOUS | 2 refills | Status: AC

## 2022-09-21 MED ORDER — SEMAGLUTIDE-WEIGHT MANAGEMENT 1 MG/0.5ML ~~LOC~~ SOAJ: 1.0000 mg | SUBCUTANEOUS | 0 refills | Status: AC

## 2022-09-21 MED ORDER — SEMAGLUTIDE-WEIGHT MANAGEMENT 0.25 MG/0.5ML ~~LOC~~ SOAJ: 0.2500 mg | SUBCUTANEOUS | 0 refills | Status: AC

## 2022-09-21 MED ORDER — SEMAGLUTIDE-WEIGHT MANAGEMENT 1.7 MG/0.75ML ~~LOC~~ SOAJ: 1.7000 mg | SUBCUTANEOUS | 0 refills | Status: AC

## 2022-10-05 ENCOUNTER — Other Ambulatory Visit (HOSPITAL_COMMUNITY): Payer: Self-pay

## 2022-10-05 ENCOUNTER — Telehealth: Payer: Self-pay

## 2022-10-05 NOTE — Telephone Encounter (Signed)
Pharmacy Patient Advocate Encounter   Received notification from Patient Advice Request messages that prior authorization for [facility] 0.25mg /0.32ml is required/requested.   Insurance verification completed.   The patient is insured through [employer] .   Per test claim: PA required; PA submitted to [facility] via CoverMyMeds Key/confirmation #/EOC [id number] Status is pending

## 2022-10-08 ENCOUNTER — Other Ambulatory Visit (HOSPITAL_COMMUNITY): Payer: Self-pay

## 2022-10-08 NOTE — Telephone Encounter (Signed)
Pharmacy Patient Advocate Encounter  Received notification from [facility] that Prior Authorization for [facility] 0.25mg /0.94ml has been DENIED.  See denial reason below. No denial letter attached in CMM. Will attache denial letter to Media tab once received.   PA #/Case ID/Reference #: [id number]

## 2022-10-19 ENCOUNTER — Other Ambulatory Visit: Payer: Self-pay | Admitting: Internal Medicine

## 2022-10-21 ENCOUNTER — Telehealth: Payer: Self-pay | Admitting: Internal Medicine

## 2022-10-21 NOTE — Telephone Encounter (Signed)
Pt has requested to transfer care from Dr. Okey Dupre to Dr. Casimiro Needle. Is this transfer okay with both providers?

## 2022-10-21 NOTE — Telephone Encounter (Signed)
Fine with me

## 2022-10-21 NOTE — Telephone Encounter (Signed)
Ok with me as well. Looks like they already scheduled an appt with me in december

## 2022-12-07 ENCOUNTER — Encounter: Payer: 59 | Admitting: Family Medicine

## 2022-12-07 ENCOUNTER — Encounter: Payer: Self-pay | Admitting: Family Medicine

## 2022-12-07 NOTE — Telephone Encounter (Signed)
It's ok for patient to reschedule.

## 2022-12-30 ENCOUNTER — Telehealth: Payer: 59 | Admitting: Physician Assistant

## 2022-12-30 DIAGNOSIS — M25572 Pain in left ankle and joints of left foot: Secondary | ICD-10-CM | POA: Diagnosis not present

## 2022-12-30 DIAGNOSIS — G8929 Other chronic pain: Secondary | ICD-10-CM

## 2022-12-30 MED ORDER — PREDNISONE 10 MG (21) PO TBPK: ORAL_TABLET | ORAL | 0 refills | Status: DC

## 2022-12-30 NOTE — Patient Instructions (Signed)
Sara Hunt, thank you for joining [name], PA-C for today's virtual visit.  While this provider is not your primary care provider (PCP), if your PCP is located in our provider database this encounter information will be shared with them immediately following your visit.   A [facility] MyChart account gives you access to today's visit and all your visits, tests, and labs performed at [facility] " click here if you don't have a [facility] MyChart account or go to [email or web address]  Consent: (Patient) Sara Hunt provided verbal consent for this virtual visit at the beginning of the encounter.  Current Medications:  Current Outpatient Medications:    predniSONE (STERAPRED UNI-PAK 21 TAB) 10 MG (21) TBPK tablet, 6 day taper; take as directed on package instructions, Disp: 21 tablet, Rfl: 0   fluconazole (DIFLUCAN) 150 MG tablet, Take by mouth., Disp: , Rfl:    HYDROcodone-acetaminophen (NORCO/VICODIN) 5-325 MG tablet, Take 1 tablet by mouth every 6 (six) hours as needed., Disp: 15 tablet, Rfl: 0   ibuprofen (ADVIL,MOTRIN) 600 MG tablet, Take 1 tablet (600 mg total) by mouth every 6 (six) hours as needed for moderate pain., Disp: 30 tablet, Rfl: 0   Menthol, Topical Analgesic, (BENGAY EX), Apply 1 application topically daily as needed (muscle pain)., Disp: , Rfl:    metoprolol succinate (TOPROL-XL) 50 MG 24 hr tablet, TAKE 1 TABLET BY MOUTH ONCE DAILY **  ANNUAL  APPOINTMENT  DUE  IN  APRIL  MUST  SEE  PROVIDER  FOR  FUTURE  REFILLS**, Disp: 90 tablet, Rfl: 0   Multiple Vitamin (MULTIVITAMIN WITH MINERALS) TABS tablet, Take 1 tablet by mouth daily., Disp: , Rfl:    rosuvastatin (CRESTOR) 10 MG tablet, Take 1 tablet (10 mg total) by mouth daily., Disp: 90 tablet, Rfl: 3   Semaglutide-Weight Management 1.7 MG/0.75ML SOAJ, Inject 1.7 mg into the skin once a week for 28 days., Disp: 3 mL, Rfl: 0   [START ON 01/15/2023] Semaglutide-Weight Management 2.4 MG/0.75ML  SOAJ, Inject 2.4 mg into the skin once a week for 28 days., Disp: 3 mL, Rfl: 2   Vitamin D, Ergocalciferol, (DRISDOL) 1.25 MG (50000 UNIT) CAPS capsule, Take 1 capsule (50,000 Units total) by mouth every 7 (seven) days., Disp: 12 capsule, Rfl: 0   Medications ordered in this encounter:  Meds ordered this encounter  Medications   predniSONE (STERAPRED UNI-PAK 21 TAB) 10 MG (21) TBPK tablet    Sig: 6 day taper; take as directed on package instructions    Dispense:  21 tablet    Refill:  0    Supervising Provider:   Merrilee Jansky [[id number]]     *If you need refills on other medications prior to your next appointment, please contact your pharmacy*  Follow-Up: Call back or seek an in-person evaluation if the symptoms worsen or if the condition fails to improve as anticipated.  [facility] [phone]  Other Instructions Ankle Pain The ankle joint helps you stand on your leg and allows you to move around. Ankle pain can happen on either side or the back of the ankle. You may have pain in one ankle or both ankles. Ankle pain may be sharp and burning or dull and aching. There may be tenderness, stiffness, redness, or warmth around the ankle. Many things can cause ankle pain. These include an injury to the area and overuse of your ankle. Follow these instructions at home: Activity Rest your ankle as told by your  health care provider. Avoid doing things that cause ankle pain. Do not use the injured limb to support your body weight until your provider says that you can. Use crutches as told by your provider. Ask your provider when it is safe to drive if you have a brace on your ankle. Do exercises as told by your provider. If you have a removable brace: Wear the brace as told by your provider. Remove it only as told by your provider. Check the skin around the brace every day. Tell your provider about any concerns. Loosen the brace if your toes tingle, become numb, or turn  cold and blue. Keep the brace clean. If the brace is not waterproof: Do not let it get wet. Cover it with a watertight covering when you take a bath or shower. If you have an elastic bandage:  Remove it when you take a bath or a shower. Try not to move your ankle much. Wiggle your toes from time to time. This helps to prevent swelling. Adjust the bandage if it feels too tight. Loosen the bandage if your foot tingles, becomes numb, or turns cold and blue. Managing pain, stiffness, and swelling  If told, put ice on the painful area. If you have a removable brace or elastic bandage, remove it as told by your provider. Put ice in a plastic bag. Place a towel between your skin and the bag. Leave the ice on for 20 minutes, 2-3 times a day. If your skin turns bright red, remove the ice right away to prevent skin damage. The risk of damage is higher if you cannot feel pain, heat, or cold. Move your toes often to reduce stiffness and swelling. Raise (elevate) your ankle above the level of your heart while you are sitting or lying down. General instructions Take over-the-counter and prescription medicines only as told by your provider. To help you and your provider, write down: How often you have ankle pain. Where the pain is. What the pain feels like. If you are told to wear a certain shoe or insole, make sure you wear it the right way and for as long as you are told. Contact a health care provider if: Your pain gets worse. Your pain does not get better with medicine. You have a fever or chills. You have more trouble walking. You have new symptoms. Your foot, leg, toes, or ankle tingles, becomes numb or swollen, or turns cold and blue. This information is not intended to replace advice given to you by your health care provider. Make sure you discuss any questions you have with your health care provider. Document Revised: 10/15/2021 Document Reviewed: 10/15/2021 Elsevier Patient Education   2024 Elsevier Inc.    If you have been instructed to have an in-person evaluation today at a local Urgent Care facility, please use the link below. It will take you to a list of all of our available [facility], including address, phone number and hours of operation. Please do not delay care.  [facility]  If you or a family member do not have a primary care provider, use the link below to schedule a visit and establish care. When you choose a [facility] primary care physician or advanced practice provider, you gain a long-term partner in health. Find a Primary Care Provider  Learn more about [facility] in-office and virtual care options: [facility] - Get Care Now

## 2022-12-30 NOTE — Progress Notes (Signed)
Virtual Visit Consent   Sara Hunt, you are scheduled for a virtual visit with a [facility] provider today. Just as with appointments in the office, your consent must be obtained to participate. Your consent will be active for this visit and any virtual visit you may have with one of our providers in the next 365 days. If you have a MyChart account, a copy of this consent can be sent to you electronically.  As this is a virtual visit, video technology does not allow for your provider to perform a traditional examination. This may limit your provider's ability to fully assess your condition. If your provider identifies any concerns that need to be evaluated in person or the need to arrange testing (such as labs, EKG, etc.), we will make arrangements to do so. Although advances in technology are sophisticated, we cannot ensure that it will always work on either your end or our end. If the connection with a video visit is poor, the visit may have to be switched to a telephone visit. With either a video or telephone visit, we are not always able to ensure that we have a secure connection.  By engaging in this virtual visit, you consent to the provision of healthcare and authorize for your insurance to be billed (if applicable) for the services provided during this visit. Depending on your insurance coverage, you may receive a charge related to this service.  I need to obtain your verbal consent now. Are you willing to proceed with your visit today? Sara Hunt has provided verbal consent on 12/30/2022 for a virtual visit (video or telephone). Sara Loveless, PA-C  Date: 12/30/2022 4:22 PM  Virtual Visit via Video Note   I, Sara Hunt, connected with  Sara Hunt  ([id number], 03/12/1975) on 12/30/22 at  4:15 PM EST by a video-enabled telemedicine application and verified that I am speaking with the correct person using two identifiers.  Location: Patient: Virtual Visit Location  Patient: Home Provider: Virtual Visit Location Provider: Home Office   I discussed the limitations of evaluation and management by telemedicine and the availability of in person appointments. The patient expressed understanding and agreed to proceed.    History of Present Illness: Sara Hunt is a 47 y.o. who identifies as a female who was assigned female at birth, and is being seen today for ankle pain.  HPI: Ankle Pain  The incident occurred 6 to 12 hours ago (Has chronic ankle pain from an injury years ago; she is also in a wheelchair, able to stand and pivot and walk some. Stood yesterday putting something away in the cabinet and felt pain when she stood, like she stood on it wrong.). The incident occurred at home. The injury mechanism is unknown. The pain is present in the left ankle. The quality of the pain is described as aching. The pain is moderate. The pain has been Constant since onset. Pertinent negatives include no inability to bear weight, loss of motion, loss of sensation, muscle weakness, numbness or tingling. Associated symptoms comments: Denies swelling, pain with palpation. She reports no foreign bodies present. The symptoms are aggravated by weight bearing. She has tried NSAIDs and non-weight bearing (ibuprofen and Aleve) for the symptoms. The treatment provided no relief.     Problems:  Patient Active Problem List   Diagnosis Date Noted   Burning tongue 06/30/2022   Allergic rhinitis due to allergen 05/30/2021   Other fatigue 01/07/2018   Essential hypertension  Neurodegenerative gait disorder 09/28/2012   PSVT (paroxysmal supraventricular tachycardia) (HCC)    Routine health maintenance 02/27/2011   Vitamin D deficiency 09/07/2010   Obesity 10/15/2009   Hyperlipidemia 07/12/2009    Allergies:  Allergies  Allergen Reactions   Latex Rash   Medications:  Current Outpatient Medications:    predniSONE (STERAPRED UNI-PAK 21 TAB) 10 MG (21) TBPK tablet, 6 day  taper; take as directed on package instructions, Disp: 21 tablet, Rfl: 0   fluconazole (DIFLUCAN) 150 MG tablet, Take by mouth., Disp: , Rfl:    HYDROcodone-acetaminophen (NORCO/VICODIN) 5-325 MG tablet, Take 1 tablet by mouth every 6 (six) hours as needed., Disp: 15 tablet, Rfl: 0   ibuprofen (ADVIL,MOTRIN) 600 MG tablet, Take 1 tablet (600 mg total) by mouth every 6 (six) hours as needed for moderate pain., Disp: 30 tablet, Rfl: 0   Menthol, Topical Analgesic, (BENGAY EX), Apply 1 application topically daily as needed (muscle pain)., Disp: , Rfl:    metoprolol succinate (TOPROL-XL) 50 MG 24 hr tablet, TAKE 1 TABLET BY MOUTH ONCE DAILY **  ANNUAL  APPOINTMENT  DUE  IN  APRIL  MUST  SEE  PROVIDER  FOR  FUTURE  REFILLS**, Disp: 90 tablet, Rfl: 0   Multiple Vitamin (MULTIVITAMIN WITH MINERALS) TABS tablet, Take 1 tablet by mouth daily., Disp: , Rfl:    rosuvastatin (CRESTOR) 10 MG tablet, Take 1 tablet (10 mg total) by mouth daily., Disp: 90 tablet, Rfl: 3   Semaglutide-Weight Management 1.7 MG/0.75ML SOAJ, Inject 1.7 mg into the skin once a week for 28 days., Disp: 3 mL, Rfl: 0   [START ON 01/15/2023] Semaglutide-Weight Management 2.4 MG/0.75ML SOAJ, Inject 2.4 mg into the skin once a week for 28 days., Disp: 3 mL, Rfl: 2   Vitamin D, Ergocalciferol, (DRISDOL) 1.25 MG (50000 UNIT) CAPS capsule, Take 1 capsule (50,000 Units total) by mouth every 7 (seven) days., Disp: 12 capsule, Rfl: 0  Observations/Objective: Patient is well-developed, well-nourished in no acute distress.  Resting comfortably at home.  Head is normocephalic, atraumatic.  No labored breathing.  Speech is clear and coherent with logical content.  Patient is alert and oriented at baseline.    Assessment and Plan: 1. Chronic pain of left ankle (Primary) - predniSONE (STERAPRED UNI-PAK 21 TAB) 10 MG (21) TBPK tablet; 6 day taper; take as directed on package instructions  Dispense: 21 tablet; Refill: 0  - Suspect acute on chronic  ankle pain; arthritic flare most likely - Failed NSAIDs - Add Prednisone dose pack - Avoid NSAIDs (Ibuprofen/Advil/Motrin or Naproxen/Aleve) while on steroid - Tylenol if okay for breakthrough pain - Ice to area - Seek in person evaluation if worsening or fails to improve with treatment   Follow Up Instructions: I discussed the assessment and treatment plan with the patient. The patient was provided an opportunity to ask questions and all were answered. The patient agreed with the plan and demonstrated an understanding of the instructions.  A copy of instructions were sent to the patient via MyChart unless otherwise noted below.    The patient was advised to call back or seek an in-person evaluation if the symptoms worsen or if the condition fails to improve as anticipated.    Sara Loveless, PA-C

## 2023-01-10 ENCOUNTER — Telehealth: Payer: 59 | Admitting: Physician Assistant

## 2023-01-10 DIAGNOSIS — M25572 Pain in left ankle and joints of left foot: Secondary | ICD-10-CM | POA: Diagnosis not present

## 2023-01-10 DIAGNOSIS — G8929 Other chronic pain: Secondary | ICD-10-CM

## 2023-01-10 NOTE — Progress Notes (Signed)
 Virtual Visit Consent   Sara Hunt, you are scheduled for a virtual visit with a [facility] provider today. Just as with appointments in the office, your consent must be obtained to participate. Your consent will be active for this visit and any virtual visit you may have with one of our providers in the next 365 days. If you have a MyChart account, a copy of this consent can be sent to you electronically.  As this is a virtual visit, video technology does not allow for your provider to perform a traditional examination. This may limit your provider's ability to fully assess your condition. If your provider identifies any concerns that need to be evaluated in person or the need to arrange testing (such as labs, EKG, etc.), we will make arrangements to do so. Although advances in technology are sophisticated, we cannot ensure that it will always work on either your end or our end. If the connection with a video visit is poor, the visit may have to be switched to a telephone visit. With either a video or telephone visit, we are not always able to ensure that we have a secure connection.  By engaging in this virtual visit, you consent to the provision of healthcare and authorize for your insurance to be billed (if applicable) for the services provided during this visit. Depending on your insurance coverage, you may receive a charge related to this service.  I need to obtain your verbal consent now. Are you willing to proceed with your visit today? Sara Hunt has provided verbal consent on 01/10/2023 for a virtual visit (video or telephone). Sara GORMAN Sage, PA-C  Date: 01/10/2023 6:38 PM  Virtual Visit via Video Note   I, Sara Hunt, connected with  Sara Hunt  ([id number], 1975/09/16) on 01/10/23 at  4:15 PM EST by a video-enabled telemedicine application and verified that I am speaking with the correct person using two identifiers.  Location: Patient: Virtual Visit Location Patient:  Home Provider: Virtual Visit Location Provider: Home Office   I discussed the limitations of evaluation and management by telemedicine and the availability of in person appointments. The patient expressed understanding and agreed to proceed.    History of Present Illness: Sara Hunt is a 48 y.o. who identifies as a female who was assigned female at birth,  presents with persistent left ankle pain. They report a history of a sprained left ankle from a fall years ago, which has been exacerbated recently due to increased weight-bearing activities during the week of Christmas. Despite taking prescribed prednisone , the patient admits to not adhering to rest and continues to put weight on the affected ankle, leading to recurrent pain.  The patient describes the pain as significant, with no associated swelling. Over-the-counter ibuprofen  was taken for pain management, but it provided only limited relief. The patient also attempted icing the area with minimal improvement. They have ordered a boot to provide support and relieve pressure when standing.   Problems:  Patient Active Problem List   Diagnosis Date Noted   Burning tongue 06/30/2022   Allergic rhinitis due to allergen 05/30/2021   Other fatigue 01/07/2018   Essential hypertension    Neurodegenerative gait disorder 09/28/2012   PSVT (paroxysmal supraventricular tachycardia) (HCC)    Routine health maintenance 02/27/2011   Vitamin D  deficiency 09/07/2010   Obesity 10/15/2009   Hyperlipidemia 07/12/2009    Allergies:  Allergies  Allergen Reactions   Latex Rash   Medications:  Current Outpatient  Medications:    fluconazole  (DIFLUCAN ) 150 MG tablet, Take by mouth., Disp: , Rfl:    HYDROcodone -acetaminophen  (NORCO/VICODIN) 5-325 MG tablet, Take 1 tablet by mouth every 6 (six) hours as needed., Disp: 15 tablet, Rfl: 0   ibuprofen  (ADVIL ,MOTRIN ) 600 MG tablet, Take 1 tablet (600 mg total) by mouth every 6 (six) hours as needed for  moderate pain., Disp: 30 tablet, Rfl: 0   Menthol , Topical Analgesic, (BENGAY EX), Apply 1 application topically daily as needed (muscle pain)., Disp: , Rfl:    metoprolol  succinate (TOPROL -XL) 50 MG 24 hr tablet, TAKE 1 TABLET BY MOUTH ONCE DAILY **  ANNUAL  APPOINTMENT  DUE  IN  APRIL  MUST  SEE  PROVIDER  FOR  FUTURE  REFILLS**, Disp: 90 tablet, Rfl: 0   Multiple Vitamin (MULTIVITAMIN WITH MINERALS) TABS tablet, Take 1 tablet by mouth daily., Disp: , Rfl:    predniSONE  (STERAPRED UNI-PAK 21 TAB) 10 MG (21) TBPK tablet, 6 day taper; take as directed on package instructions, Disp: 21 tablet, Rfl: 0   rosuvastatin  (CRESTOR ) 10 MG tablet, Take 1 tablet (10 mg total) by mouth daily., Disp: 90 tablet, Rfl: 3   Semaglutide -Weight Management 1.7 MG/0.75ML SOAJ, Inject 1.7 mg into the skin once a week for 28 days., Disp: 3 mL, Rfl: 0   [START ON 01/15/2023] Semaglutide -Weight Management 2.4 MG/0.75ML SOAJ, Inject 2.4 mg into the skin once a week for 28 days., Disp: 3 mL, Rfl: 2   Vitamin D , Ergocalciferol , (DRISDOL ) 1.25 MG (50000 UNIT) CAPS capsule, Take 1 capsule (50,000 Units total) by mouth every 7 (seven) days., Disp: 12 capsule, Rfl: 0  Observations/Objective: Patient is well-developed, well-nourished in no acute distress.  Resting comfortably  at home.  Head is normocephalic, atraumatic.  No labored breathing.  Speech is clear and coherent with logical content.  Patient is alert and oriented at baseline.    Assessment and Plan: 1. Chronic pain of left ankle (Primary)   Persistent pain despite a course of prednisone . No additional swelling. Patient has been weight-bearing on the affected ankle. -Advise to take Ibuprofen  400mg  every 6 hours for pain and inflammation. -Recommend icing the area and rest. - Follow up with Orthopedics for further evaluation and management.   Follow Up Instructions: I discussed the assessment and treatment plan with the patient. The patient was provided an  opportunity to ask questions and Hunt were answered. The patient agreed with the plan and demonstrated an understanding of the instructions.  A copy of instructions were sent to the patient via MyChart unless otherwise noted below.     The patient was advised to call back or seek an in-person evaluation if the symptoms worsen or if the condition fails to improve as anticipated.    Sara RAMAN Mayers, PA-C

## 2023-01-10 NOTE — Patient Instructions (Signed)
 Sara Hunt, thank you for joining [name], PA-C for today's virtual visit.  While this provider is not your primary care provider (PCP), if your PCP is located in our provider database this encounter information will be shared with them immediately following your visit.   A [place] MyChart account gives you access to today's visit and all your visits, tests, and labs performed at [facility]  click here if you don't have a [facility] MyChart account or go to [email or web address]  Consent: (Patient) Sara Hunt provided verbal consent for this virtual visit at the beginning of the encounter.  Current Medications:  Current Outpatient Medications:    fluconazole  (DIFLUCAN ) 150 MG tablet, Take by mouth., Disp: , Rfl:    HYDROcodone -acetaminophen  (NORCO/VICODIN) 5-325 MG tablet, Take 1 tablet by mouth every 6 (six) hours as needed., Disp: 15 tablet, Rfl: 0   ibuprofen  (ADVIL ,MOTRIN ) 600 MG tablet, Take 1 tablet (600 mg total) by mouth every 6 (six) hours as needed for moderate pain., Disp: 30 tablet, Rfl: 0   Menthol , Topical Analgesic, (BENGAY EX), Apply 1 application topically daily as needed (muscle pain)., Disp: , Rfl:    metoprolol  succinate (TOPROL -XL) 50 MG 24 hr tablet, TAKE 1 TABLET BY MOUTH ONCE DAILY **  ANNUAL  APPOINTMENT  DUE  IN  APRIL  MUST  SEE  PROVIDER  FOR  FUTURE  REFILLS**, Disp: 90 tablet, Rfl: 0   Multiple Vitamin (MULTIVITAMIN WITH MINERALS) TABS tablet, Take 1 tablet by mouth daily., Disp: , Rfl:    predniSONE  (STERAPRED UNI-PAK 21 TAB) 10 MG (21) TBPK tablet, 6 day taper; take as directed on package instructions, Disp: 21 tablet, Rfl: 0   rosuvastatin  (CRESTOR ) 10 MG tablet, Take 1 tablet (10 mg total) by mouth daily., Disp: 90 tablet, Rfl: 3   Semaglutide -Weight Management 1.7 MG/0.75ML SOAJ, Inject 1.7 mg into the skin once a week for 28 days., Disp: 3 mL, Rfl: 0   [START ON 01/15/2023] Semaglutide -Weight Management 2.4 MG/0.75ML SOAJ,  Inject 2.4 mg into the skin once a week for 28 days., Disp: 3 mL, Rfl: 2   Vitamin D , Ergocalciferol , (DRISDOL ) 1.25 MG (50000 UNIT) CAPS capsule, Take 1 capsule (50,000 Units total) by mouth every 7 (seven) days., Disp: 12 capsule, Rfl: 0   Medications ordered in this encounter:  No orders of the defined types were placed in this encounter.    *If you need refills on other medications prior to your next appointment, please contact your pharmacy*  Follow-Up: Call back or seek an in-person evaluation if the symptoms worsen or if the condition fails to improve as anticipated.  [facility] [phone]  Other Instructions Ankle Pain The ankle joint helps you stand on your leg and allows you to move around. Ankle pain can happen on either side or the back of the ankle. You may have pain in one ankle or both ankles. Ankle pain may be sharp and burning or dull and aching. There may be tenderness, stiffness, redness, or warmth around the ankle. Many things can cause ankle pain. These include an injury to the area and overuse of your ankle. Follow these instructions at home: Activity Rest your ankle as told by your health care provider. Avoid doing things that cause ankle pain. Do not use the injured limb to support your body weight until your provider says that you can. Use crutches as told by your provider. Ask your provider when it is safe to drive if  you have a brace on your ankle. Do exercises as told by your provider. If you have a removable brace: Wear the brace as told by your provider. Remove it only as told by your provider. Check the skin around the brace every day. Tell your provider about any concerns. Loosen the brace if your toes tingle, become numb, or turn cold and blue. Keep the brace clean. If the brace is not waterproof: Do not let it get wet. Cover it with a watertight covering when you take a bath or shower. If you have an elastic bandage:  Remove it  when you take a bath or a shower. Try not to move your ankle much. Wiggle your toes from time to time. This helps to prevent swelling. Adjust the bandage if it feels too tight. Loosen the bandage if your foot tingles, becomes numb, or turns cold and blue. Managing pain, stiffness, and swelling  If told, put ice on the painful area. If you have a removable brace or elastic bandage, remove it as told by your provider. Put ice in a plastic bag. Place a towel between your skin and the bag. Leave the ice on for 20 minutes, 2-3 times a day. If your skin turns bright red, remove the ice right away to prevent skin damage. The risk of damage is higher if you cannot feel pain, heat, or cold. Move your toes often to reduce stiffness and swelling. Raise (elevate) your ankle above the level of your heart while you are sitting or lying down. General instructions Take over-the-counter and prescription medicines only as told by your provider. To help you and your provider, write down: How often you have ankle pain. Where the pain is. What the pain feels like. If you are told to wear a certain shoe or insole, make sure you wear it the right way and for as long as you are told. Contact a health care provider if: Your pain gets worse. Your pain does not get better with medicine. You have a fever or chills. You have more trouble walking. You have new symptoms. Your foot, leg, toes, or ankle tingles, becomes numb or swollen, or turns cold and blue. This information is not intended to replace advice given to you by your health care provider. Make sure you discuss any questions you have with your health care provider. Document Revised: 10/15/2021 Document Reviewed: 10/15/2021 Elsevier Patient Education  2024 Elsevier Inc.    If you have been instructed to have an in-person evaluation today at a local Urgent Care facility, please use the link below. It will take you to a list of all of our available [facility], including address, phone number and hours of operation. Please do not delay care.  [facility]  If you or a family member do not have a primary care provider, use the link below to schedule a visit and establish care. When you choose a [place] primary care physician or advanced practice provider, you gain a long-term partner in health. Find a Primary Care Provider  Learn more about [facility]'s in-office and virtual care options: [facility] - Get Care Now

## 2023-01-14 NOTE — Progress Notes (Signed)
 I, Leotis Batter, CMA acting as a scribe for Artist Lloyd, MD.  Sara Hunt is a 48 y.o. female who presents to [facility] today for L ankle pain ongoing since around Christmas. She notes spraining her L ankle about a year ago and thinks the increased activity around Christmas time exacerbated her pain. Pt locates pain to top of the ankle. Wearing ankle brace today. Denies new swelling.   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  She predominantly uses a wheelchair but does stand with a walker to transition and ambulate around her home.   Exam:  BP 136/88   Pulse 73   Ht 5' 6 (1.676 m)   LMP 10/20/2017 (Exact Date)   SpO2 98%   BMI 28.37 kg/m  General: Well Developed, well nourished, and in no acute distress.   MSK: Left foot and ankle some edema is present.  Tender palpation anterior lateral ankle.  Motion is somewhat limited and strength is reduced to dorsiflexion and eversion.  Capillary fill and pulses are intact distally.    Lab and Radiology Results  Diagnostic Limited MSK Ultrasound of: Left lateral ankle No significant joint effusion is present. Bony cortexes are normal-appearing Peroneal tendons are normal-appearing Soft tissue hypoechoic fluid tracking within subcutaneous soft tissue consistent with edema. Impression: Edema   X-ray images left foot and ankle obtained today personally and independently interpreted  Left ankle: No acute fractures.  No severe degeneration.  Left foot: Dorsal spur at distal talus.  No acute fractures are visible.  Await formal radiology review   Assessment and Plan: 48 y.o. female with acute exacerbation of chronic left foot and ankle pain.  This occurs in the  setting of partial paraplegia.  She inverted her ankle while standing.  Normally she uses a wheelchair but does transition and uses a walker at times.  Differential diagnosis today is an ankle sprain.  Given her unique circumstances she will benefit from some limited physical therapy.  She does have a cam walker boot that she can use if needed however I would advise her to not use a cam walker boot at this time as it would make her more likely to fall.  Continue compression and ankle brace.  Add Voltaren  gel.  Recheck in 8 weeks.   PDMP not reviewed this encounter. Orders Placed This Encounter  Procedures   US  LIMITED JOINT SPACE STRUCTURES LOW RIGHT(NO LINKED CHARGES)    Reason for Exam (SYMPTOM  OR DIAGNOSIS REQUIRED):   right ankle pain    Preferred imaging location?:   [facility] Ankle Complete Left    Standing Status:   Future    Number of Occurrences:   1    Expiration Date:   01/15/2024    Reason for Exam (SYMPTOM  OR DIAGNOSIS REQUIRED):   left ankle pain    Preferred imaging location?:   [facility]    Is patient pregnant?:   No   DG Foot Complete Left    Standing Status:   Future    Number of Occurrences:   1    Expiration Date:   01/15/2024    Reason for Exam (SYMPTOM  OR DIAGNOSIS REQUIRED):   left ankle pain    Preferred imaging location?:  [facility]    Is patient pregnant?:   No   Ambulatory referral to Physical Therapy    Referral Priority:   Routine    Referral Type:   Physical Medicine    Referral Reason:   Specialty Services Required    Requested Specialty:   Physical Therapy    Number of Visits Requested:   1   No orders of the defined types were placed in this encounter.    Discussed warning signs or symptoms. Please see discharge instructions. Patient expresses understanding.   The above documentation has been reviewed and is accurate and complete Artist Lloyd, M.D.

## 2023-01-15 ENCOUNTER — Ambulatory Visit (INDEPENDENT_AMBULATORY_CARE_PROVIDER_SITE_OTHER): Payer: 59

## 2023-01-15 ENCOUNTER — Encounter: Payer: Self-pay | Admitting: Family Medicine

## 2023-01-15 ENCOUNTER — Ambulatory Visit: Payer: 59 | Admitting: Family Medicine

## 2023-01-15 ENCOUNTER — Other Ambulatory Visit: Payer: Self-pay

## 2023-01-15 VITALS — BP 136/88 | HR 73 | Ht 66.0 in

## 2023-01-15 DIAGNOSIS — M25572 Pain in left ankle and joints of left foot: Secondary | ICD-10-CM

## 2023-01-15 DIAGNOSIS — M25571 Pain in right ankle and joints of right foot: Secondary | ICD-10-CM

## 2023-01-15 DIAGNOSIS — R2689 Other abnormalities of gait and mobility: Secondary | ICD-10-CM | POA: Diagnosis not present

## 2023-01-15 NOTE — Patient Instructions (Addendum)
 Thank you for coming in today.   Please get an Xray today before you leave   I've referred you to Physical Therapy.  Let us  know if you don't hear from them in one week.   Please use Voltaren  gel (Generic Diclofenac  Gel) up to 4x daily for pain as needed.  This is available over-the-counter as both the name brand Voltaren  gel and the generic diclofenac  gel.   Check back in 8 weeks

## 2023-01-18 NOTE — Progress Notes (Signed)
 Left foot x-ray shows that spur we talked about on the ankle x-ray and some arthritis in your midfoot.

## 2023-01-18 NOTE — Progress Notes (Signed)
 Left ankle x-ray shows a spur at the front of the ankle which could cause some pain if you dorsiflex your foot all the way up.  Ongoing issue and not new.  You do have some arthritis in your midfoot.  No acute fractures are present.

## 2023-01-18 NOTE — Telephone Encounter (Signed)
 I am out of my depth here.  Do you want me to place referral to have a consultation with Dr. Loreta Ave?

## 2023-01-24 ENCOUNTER — Other Ambulatory Visit: Payer: Self-pay | Admitting: Internal Medicine

## 2023-02-11 ENCOUNTER — Telehealth: Payer: 59 | Admitting: Physician Assistant

## 2023-02-11 DIAGNOSIS — M5442 Lumbago with sciatica, left side: Secondary | ICD-10-CM | POA: Diagnosis not present

## 2023-02-11 MED ORDER — CYCLOBENZAPRINE HCL 10 MG PO TABS: 10.0000 mg | ORAL_TABLET | Freq: Three times a day (TID) | ORAL | 0 refills | Status: DC | PRN

## 2023-02-11 MED ORDER — MELOXICAM 15 MG PO TABS: 15.0000 mg | ORAL_TABLET | Freq: Every day | ORAL | 0 refills | Status: DC

## 2023-02-11 NOTE — Patient Instructions (Signed)
 Sara Hunt, thank you for joining [name], PA-C for today's virtual visit.  While this provider is not your primary care provider (PCP), if your PCP is located in our provider database this encounter information will be shared with them immediately following your visit.   A [place] [facility] MyChart account gives you access to today's visit and all your visits, tests, and labs performed at [facility]  click here if you don't have a [facility] MyChart account or go to [email or web address]  Consent: (Patient) Sara Hunt provided verbal consent for this virtual visit at the beginning of the encounter.  Current Medications:  Current Outpatient Medications:    fluconazole  (DIFLUCAN ) 150 MG tablet, Take by mouth., Disp: , Rfl:    HYDROcodone -acetaminophen  (NORCO/VICODIN) 5-325 MG tablet, Take 1 tablet by mouth every 6 (six) hours as needed., Disp: 15 tablet, Rfl: 0   ibuprofen  (ADVIL ,MOTRIN ) 600 MG tablet, Take 1 tablet (600 mg total) by mouth every 6 (six) hours as needed for moderate pain., Disp: 30 tablet, Rfl: 0   Menthol , Topical Analgesic, (BENGAY EX), Apply 1 application topically daily as needed (muscle pain)., Disp: , Rfl:    metoprolol  succinate (TOPROL -XL) 50 MG 24 hr tablet, TAKE 1 TABLET BY MOUTH ONCE DAILY ANNUAL  APPT  DUE  IN  APRIL  --MUST  SEE  PROVIDER  FOR  FUTURE  REFILLS, Disp: 90 tablet, Rfl: 0   Multiple Vitamin (MULTIVITAMIN WITH MINERALS) TABS tablet, Take 1 tablet by mouth daily., Disp: , Rfl:    predniSONE  (STERAPRED UNI-PAK 21 TAB) 10 MG (21) TBPK tablet, 6 day taper; take as directed on package instructions, Disp: 21 tablet, Rfl: 0   rosuvastatin  (CRESTOR ) 10 MG tablet, Take 1 tablet (10 mg total) by mouth daily., Disp: 90 tablet, Rfl: 3   Semaglutide -Weight Management 2.4 MG/0.75ML SOAJ, Inject 2.4 mg into the skin once a week for 28 days., Disp: 3 mL, Rfl: 2   Vitamin D , Ergocalciferol , (DRISDOL ) 1.25 MG (50000 UNIT) CAPS capsule, Take 1  capsule (50,000 Units total) by mouth every 7 (seven) days., Disp: 12 capsule, Rfl: 0   Medications ordered in this encounter:  No orders of the defined types were placed in this encounter.    *If you need refills on other medications prior to your next appointment, please contact your pharmacy*  Follow-Up: Call back or seek an in-person evaluation if the symptoms worsen or if the condition fails to improve as anticipated.  [facility] [phone]  Other Instructions Sciatica  Sciatica is pain, weakness, tingling, or loss of feeling (numbness) along the sciatic nerve. The sciatic nerve starts in the lower back and goes down the back of each leg. Sciatica usually affects one side of the body. Sciatica usually goes away on its own or with treatment. Sometimes, sciatica may come back. What are the causes? This condition happens when the sciatic nerve is pinched or has pressure put on it. This may be caused by: A disk in between the bones of the spine bulging out too far (herniated disk). Changes in the spinal disks due to aging. A condition that affects a muscle in the butt. Extra bone growth near the sciatic nerve. A break (fracture) of the area between your hip bones (pelvis). Pregnancy. Tumor. This is rare. What increases the risk? You are more likely to develop this condition if you: Play sports that put pressure or stress on the spine. Have poor strength and ease of movement (flexibility). Have  had a back injury or back surgery. Sit for long periods of time. Do activities that involve bending or lifting over and over again. Are very overweight (obese). What are the signs or symptoms? Symptoms can vary from mild to very bad. They may include: Any of these problems in the lower back, leg, hip, or butt: Mild tingling, loss of feeling, or dull aches. A burning feeling. Sharp pains. Loss of feeling in the back of the calf or the sole of the foot. Leg  weakness. Very bad back pain that makes it hard to move. These symptoms may get worse when you cough, sneeze, or laugh. They may also get worse when you sit or stand for long periods of time. How is this treated? This condition often gets better without any treatment. However, treatment may include: Changing or cutting back on physical activity when you have pain. Exercising, including strengthening and stretching. Putting ice or heat on the affected area. Shots of medicines to relieve pain and swelling or to relax your muscles. Surgery. Follow these instructions at home: Medicines Take over-the-counter and prescription medicines only as told by your doctor. Ask your doctor if you should avoid driving or using machines while you are taking your medicine. Managing pain     If told, put ice on the affected area. To do this: Put ice in a plastic bag. Place a towel between your skin and the bag. Leave the ice on for 20 minutes, 2-3 times a day. If your skin turns bright red, take off the ice right away to prevent skin damage. The risk of skin damage is higher if you cannot feel pain, heat, or cold. If told, put heat on the affected area. Do this as often as told by your doctor. Use the heat source that your doctor tells you to use, such as a moist heat pack or a heating pad. Place a towel between your skin and the heat source. Leave the heat on for 20-30 minutes. If your skin turns bright red, take off the heat right away to prevent burns. The risk of burns is higher if you cannot feel pain, heat, or cold. Activity  Return to your normal activities when your doctor says that it is safe. Avoid activities that make your symptoms worse. Take short rests during the day. When you rest for a long time, do some physical activity or stretching between periods of rest. Avoid sitting for a long time without moving. Get up and move around at least one time each hour. Do exercises and stretches as  told by your doctor. Do not lift anything that is heavier than 10 lb (4.5 kg). Avoid lifting heavy things even when you do not have symptoms. Avoid lifting heavy things over and over. When you lift objects, always lift in a way that is safe for your body. To do this, you should: Bend your knees. Keep the object close to your body. Avoid twisting. General instructions Stay at a healthy weight. Wear comfortable shoes that support your feet. Avoid wearing high heels. Avoid sleeping on a mattress that is too soft or too hard. You might have less pain if you sleep on a mattress that is firm enough to support your back. Contact a doctor if: Your pain is not controlled by medicine. Your pain does not get better. Your pain gets worse. Your pain lasts longer than 4 weeks. You lose weight without trying. Get help right away if: You cannot control when you pee (urinate)  or poop (have a bowel movement). You have weakness in any of these areas and it gets worse: Lower back. The area between your hip bones. Butt. Legs. You have redness or swelling of your back. You have a burning feeling when you pee. Summary Sciatica is pain, weakness, tingling, or loss of feeling (numbness) along the sciatic nerve. This may include the lower back, legs, hips, and butt. This condition happens when the sciatic nerve is pinched or has pressure put on it. Treatment often includes rest, exercise, medicines, and putting ice or heat on the affected area. This information is not intended to replace advice given to you by your health care provider. Make sure you discuss any questions you have with your health care provider. Document Revised: 03/31/2021 Document Reviewed: 03/31/2021 Elsevier Patient Education  2024 Elsevier Inc.   If you have been instructed to have an in-person evaluation today at a local Urgent Care facility, please use the link below. It will take you to a list of all of our available [facility] [place]  [facility], including address, phone number and hours of operation. Please do not delay care.  [facility]  If you or a family member do not have a primary care provider, use the link below to schedule a visit and establish care. When you choose a [facility] primary care physician or advanced practice provider, you gain a long-term partner in health. Find a Primary Care Provider  Learn more about [facility]'s in-office and virtual care options: [facility] - Get Care Now

## 2023-02-11 NOTE — Progress Notes (Signed)
 Virtual Visit Consent   Sara Hunt, you are scheduled for a virtual visit with a [facility] provider today. Just as with appointments in the office, your consent must be obtained to participate. Your consent will be active for this visit and any virtual visit you may have with one of our providers in the next 365 days. If you have a MyChart account, a copy of this consent can be sent to you electronically.  As this is a virtual visit, video technology does not allow for your provider to perform a traditional examination. This may limit your provider's ability to fully assess your condition. If your provider identifies any concerns that need to be evaluated in person or the need to arrange testing (such as labs, EKG, etc.), we will make arrangements to do so. Although advances in technology are sophisticated, we cannot ensure that it will always work on either your end or our end. If the connection with a video visit is poor, the visit may have to be switched to a telephone visit. With either a video or telephone visit, we are not always able to ensure that we have a secure connection.  By engaging in this virtual visit, you consent to the provision of healthcare and authorize for your insurance to be billed (if applicable) for the services provided during this visit. Depending on your insurance coverage, you may receive a charge related to this service.  I need to obtain your verbal consent now. Are you willing to proceed with your visit today? Sara Hunt has provided verbal consent on 02/11/2023 for a virtual visit (video or telephone). Sara Hunt, [place]  Date: 02/11/2023 6:24 PM  Virtual Visit via Video Note   I, Sara Hunt, connected with  DONJA TIPPING  ([id number], Jan 16, 1975) on 02/11/23 at  6:15 PM EST by a video-enabled telemedicine application and verified that I am speaking with the correct person using two identifiers.  Location: Patient: Virtual Visit Location  Patient: Home Provider: Virtual Visit Location Provider: Home Office   I discussed the limitations of evaluation and management by telemedicine and the availability of in person appointments. The patient expressed understanding and agreed to proceed.    History of Present Illness: Sara Hunt is a 48 y.o. who identifies as a female who was assigned female at birth, and is being seen today for flare of sciatica over the past two days ago. Notes her office was being moved and she helped pick up something with noted pulling in her left lower back. Since then has progressed to 5/10 lower back pain with radiation into the buttock. Is wheelchair bound which is making it harder not to trigger her sciatica. Denies new paresthesia or change to bowel/bladder habits. Is taking Ibuprofen  with some relief.     HPI: HPI  Problems:  Patient Active Problem List   Diagnosis Date Noted   Burning tongue 06/30/2022   Allergic rhinitis due to allergen 05/30/2021   Other fatigue 01/07/2018   Essential hypertension    Neurodegenerative gait disorder 09/28/2012   PSVT (paroxysmal supraventricular tachycardia) (HCC)    Routine health maintenance 02/27/2011   Vitamin D  deficiency 09/07/2010   Obesity 10/15/2009   Hyperlipidemia 07/12/2009    Allergies:  Allergies  Allergen Reactions   Latex Rash   Medications:  Current Outpatient Medications:    cyclobenzaprine  (FLEXERIL ) 10 MG tablet, Take 1 tablet (10 mg total) by mouth 3 (three) times daily as needed., Disp: 15 tablet, Rfl: 0  meloxicam  (MOBIC ) 15 MG tablet, Take 1 tablet (15 mg total) by mouth daily., Disp: 15 tablet, Rfl: 0   ibuprofen  (ADVIL ,MOTRIN ) 600 MG tablet, Take 1 tablet (600 mg total) by mouth every 6 (six) hours as needed for moderate pain., Disp: 30 tablet, Rfl: 0   Menthol , Topical Analgesic, (BENGAY EX), Apply 1 application topically daily as needed (muscle pain)., Disp: , Rfl:    metoprolol  succinate (TOPROL -XL) 50 MG 24 hr tablet,  TAKE 1 TABLET BY MOUTH ONCE DAILY ANNUAL  APPT  DUE  IN  APRIL  --MUST  SEE  PROVIDER  FOR  FUTURE  REFILLS, Disp: 90 tablet, Rfl: 0   Multiple Vitamin (MULTIVITAMIN WITH MINERALS) TABS tablet, Take 1 tablet by mouth daily., Disp: , Rfl:    rosuvastatin  (CRESTOR ) 10 MG tablet, Take 1 tablet (10 mg total) by mouth daily., Disp: 90 tablet, Rfl: 3   Semaglutide -Weight Management 2.4 MG/0.75ML SOAJ, Inject 2.4 mg into the skin once a week for 28 days., Disp: 3 mL, Rfl: 2  Observations/Objective: Patient is well-developed, well-nourished in no acute distress.  Resting comfortably at home.  Head is normocephalic, atraumatic.  No labored breathing. Speech is clear and coherent with logical content.  Patient is alert and oriented at baseline.   Assessment and Plan: 1. Acute bilateral low back pain with left-sided sciatica (Primary) - cyclobenzaprine  (FLEXERIL ) 10 MG tablet; Take 1 tablet (10 mg total) by mouth 3 (three) times daily as needed.  Dispense: 15 tablet; Refill: 0 - meloxicam  (MOBIC ) 15 MG tablet; Take 1 tablet (15 mg total) by mouth daily.  Dispense: 15 tablet; Refill: 0  Supportive measures and OTC medications reviewed. Begin Flexeril  and Meloxicam  per orders. Follow-up with PCP as scheduled.  Strict in person evaluation precautions reviewed. Work note declined.   Follow Up Instructions: I discussed the assessment and treatment plan with the patient. The patient was provided an opportunity to ask questions and all were answered. The patient agreed with the plan and demonstrated an understanding of the instructions.  A copy of instructions were sent to the patient via MyChart unless otherwise noted below.   The patient was advised to call back or seek an in-person evaluation if the symptoms worsen or if the condition fails to improve as anticipated.    Sara Velma Lunger, PA-C

## 2023-02-16 ENCOUNTER — Encounter: Payer: Self-pay | Admitting: Family Medicine

## 2023-02-16 ENCOUNTER — Ambulatory Visit: Payer: 59 | Admitting: Family Medicine

## 2023-02-16 DIAGNOSIS — Z6828 Body mass index (BMI) 28.0-28.9, adult: Secondary | ICD-10-CM

## 2023-02-16 DIAGNOSIS — E782 Mixed hyperlipidemia: Secondary | ICD-10-CM

## 2023-02-16 DIAGNOSIS — I10 Essential (primary) hypertension: Secondary | ICD-10-CM

## 2023-02-16 NOTE — Progress Notes (Unsigned)
Established Patient Office Visit  Subjective   Patient ID: Sara Hunt, female    DOB: 1975-08-30  Age: 48 y.o. MRN: [id number]  Chief Complaint  Patient presents with   Establish Care    Patient is here to transition her care from Dr. Okey Dupre. She reports that she wanted a second opinion about her medications and her medical conditions. She reports that she has a history of high blood pressure, and she is also taking cholesterol medication as well. I have reviewed her last set of blood work with her.   HTN -- pt is currently on metoprolol XL 50 mg daily, states that she was originally put on this for SVT and is s/p ablation. She reports that she is taking her metoprolol daily, states that she also has "white coat" syndrome. She also reports that she has a "stressful" job and is currently in pain -- states that she has had back trouble off and on since she had an accident with T12 injury several years ago. I reviewed imaging, the only thing that I found was a CT abd from 2015 that showed chornic rib fractures and age advanced degeneration of the spine. States that she has been taking ibuprofen for this acutely. Patient states that she does check her BP at home, states that it is usually in the mid-130's. States her BP elevates when she comes to the doctor.   Acute back strain-- she did a video visit, was given muscle relaxers and meloxicam. States that she is still having pain despite the medications, is asking if there is something else she can take. I advised PRN tylenol 500 mg every 4 hours in addition to the meloxicam.  Morbid obesity-- patient states that she is interested in weight loss therapy. States that she has tried limiting portions, has tried meal planning, has tried limiting sweets as well. Has never tried weight watchers or any other specific diets. States that she has never tried any exercise programs in the past either-- cannot easily participate in exercise due to her chronic  spine issues. Pt reports she cannot get on our scale today to get an accurate weight. I counseled her that we need a weight to calculate her BMI so that we could complete the prior auth for wegovy.      Current Outpatient Medications  Medication Instructions   cyclobenzaprine (FLEXERIL) 10 mg, Oral, 3 times daily PRN   ibuprofen (ADVIL) 600 mg, Oral, Every 6 hours PRN   meloxicam (MOBIC) 15 mg, Oral, Daily   Menthol, Topical Analgesic, (BENGAY EX) 1 application , Daily PRN   metoprolol succinate (TOPROL-XL) 50 MG 24 hr tablet TAKE 1 TABLET BY MOUTH ONCE DAILY ANNUAL  APPT  DUE  IN  APRIL  --MUST  SEE  PROVIDER  FOR  FUTURE  REFILLS   Multiple Vitamin (MULTIVITAMIN WITH MINERALS) TABS tablet 1 tablet, Daily   rosuvastatin (CRESTOR) 10 mg, Oral, Daily    Patient Active Problem List   Diagnosis Date Noted   Burning tongue 06/30/2022   Allergic rhinitis due to allergen 05/30/2021   Other fatigue 01/07/2018   Essential hypertension    Neurodegenerative gait disorder 09/28/2012   PSVT (paroxysmal supraventricular tachycardia) (HCC)    Routine health maintenance 02/27/2011   Vitamin D deficiency 09/07/2010   Morbid obesity (HCC) 10/15/2009   Hyperlipidemia 07/12/2009      Review of Systems  All other systems reviewed and are negative.     Objective:     BP Marland Kitchen)  154/90   Pulse 91   Temp 98.5 F (36.9 C) (Oral)   Ht 5\' 6"  (1.676 m)   LMP 10/20/2017 (Exact Date)   SpO2 99%   BMI 28.37 kg/m     Physical Exam Vitals reviewed.  Constitutional:      Appearance: Normal appearance. She is well-groomed. She is obese.  Neck:     Thyroid: No thyromegaly.  Cardiovascular:     Rate and Rhythm: Normal rate and regular rhythm.     Pulses: Normal pulses.     Heart sounds: S1 normal and S2 normal.  Pulmonary:     Effort: Pulmonary effort is normal.     Breath sounds: Normal breath sounds and air entry.  Musculoskeletal:     Right lower leg: No edema.     Left lower leg: No  edema.  Neurological:     Mental Status: She is alert and oriented to person, place, and time. Mental status is at baseline.     Gait: Gait is intact.  Psychiatric:        Mood and Affect: Mood and affect normal.        Speech: Speech normal.        Behavior: Behavior normal.        Judgment: Judgment normal.      No results found for any visits on 02/16/23.     The 10-year ASCVD risk score (Arnett DK, et al., 2019) is: 13.9%    Assessment & Plan:  Morbid obesity (HCC) Assessment & Plan: I have had an extensive 30 minute conversation today with the patient about healthy eating habits, exercise, calorie and carb goals for sustainable and successful weight loss. I gave the patient caloric and protein daily intake values as well as described the importance of increasing fiber and water intake. I discussed weight loss medications that could be used in the treatment of this patient. Handouts on low carb eating were given to the patient.    Total calories per day: 1800  Total carbohydrates per day: less than 100 grams of carbs per day  Total protein per day: 120 grams per day  Pt has both HLD and HTN which are both comorbid conditions associates with her obesity. Patient would be an excellent candidate for weight loss medication, however I need her BMI before I can order the wegovy, which means I need an accurate weight. Pt is unable to get out of her wheelchair today due to the acute back pain.    Essential hypertension Assessment & Plan: On metoprolol 50 mg daily, she is not well controlled, although she reports normal BP's at home and white coat HTN when she comes into the office. Will continue metoprolol as prescribed.    Mixed hyperlipidemia Assessment & Plan: On rosuvastatin 10 mg daily, reviewed last set of labs, will continue this as prescribed.       Return in about 3 months (around 05/16/2023) for weight loss.    Karie Georges, MD

## 2023-02-16 NOTE — Patient Instructions (Addendum)
Total calories per day: 1800  Total carbohydrates per day: less than 100 grams of carbs per day  Total protein per day: 120 grams per day  Apps -- Myfitnesspal, Loseit!, Carb Manager  Ok to take tylenol 500 mg every 4-6 hours in addition to the daily meloxicam.

## 2023-02-16 NOTE — Assessment & Plan Note (Addendum)
I have had an extensive 30 minute conversation today with the patient about healthy eating habits, exercise, calorie and carb goals for sustainable and successful weight loss. I gave the patient caloric and protein daily intake values as well as described the importance of increasing fiber and water intake. I discussed weight loss medications that could be used in the treatment of this patient. Handouts on low carb eating were given to the patient.    Total calories per day: 1800  Total carbohydrates per day: less than 100 grams of carbs per day  Total protein per day: 120 grams per day  Pt has both HLD and HTN which are both comorbid conditions associates with her obesity. Patient would be an excellent candidate for weight loss medication, however I need her BMI before I can order the wegovy, which means I need an accurate weight. Pt is unable to get out of her wheelchair today due to the acute back pain.

## 2023-02-17 NOTE — Assessment & Plan Note (Signed)
On metoprolol 50 mg daily, she is not well controlled, although she reports normal BP's at home and white coat HTN when she comes into the office. Will continue metoprolol as prescribed.

## 2023-02-17 NOTE — Assessment & Plan Note (Signed)
On rosuvastatin 10 mg daily, reviewed last set of labs, will continue this as prescribed.

## 2023-02-26 ENCOUNTER — Ambulatory Visit: Payer: 59 | Admitting: Family Medicine

## 2023-02-28 ENCOUNTER — Encounter: Payer: Self-pay | Admitting: Family Medicine

## 2023-02-28 DIAGNOSIS — I10 Essential (primary) hypertension: Secondary | ICD-10-CM

## 2023-03-01 NOTE — Telephone Encounter (Signed)
 Message sent to PCP as I am unaware of a different place a patient can go to other than the PCP's office during the visit.

## 2023-03-01 NOTE — Telephone Encounter (Signed)
 Is there anywhere else she can go within our [facility] to be weighed?

## 2023-03-19 NOTE — Telephone Encounter (Signed)
 Spoke with the patient and scheduled an office visit on 3/24 to arrive at 1:45pm.

## 2023-03-19 NOTE — Telephone Encounter (Signed)
 Ok to put her on the schedule for me

## 2023-03-28 ENCOUNTER — Telehealth: Admitting: Family

## 2023-03-28 DIAGNOSIS — N760 Acute vaginitis: Secondary | ICD-10-CM

## 2023-03-28 DIAGNOSIS — B9689 Other specified bacterial agents as the cause of diseases classified elsewhere: Secondary | ICD-10-CM | POA: Diagnosis not present

## 2023-03-28 MED ORDER — FLUCONAZOLE 150 MG PO TABS: 150.0000 mg | ORAL_TABLET | ORAL | 0 refills | Status: DC | PRN

## 2023-03-28 MED ORDER — METRONIDAZOLE 500 MG PO TABS: 500.0000 mg | ORAL_TABLET | Freq: Two times a day (BID) | ORAL | 0 refills | Status: DC

## 2023-03-28 NOTE — Progress Notes (Signed)
 Virtual Visit Consent   Sara Hunt, you are scheduled for a virtual visit with a [facility] provider today. Just as with appointments in the office, your consent must be obtained to participate. Your consent will be active for this visit and any virtual visit you may have with one of our providers in the next 365 days. If you have a MyChart account, a copy of this consent can be sent to you electronically.  As this is a virtual visit, video technology does not allow for your provider to perform a traditional examination. This may limit your provider's ability to fully assess your condition. If your provider identifies any concerns that need to be evaluated in person or the need to arrange testing (such as labs, EKG, etc.), we will make arrangements to do so. Although advances in technology are sophisticated, we cannot ensure that it will always work on either your end or our end. If the connection with a video visit is poor, the visit may have to be switched to a telephone visit. With either a video or telephone visit, we are not always able to ensure that we have a secure connection.  By engaging in this virtual visit, you consent to the provision of healthcare and authorize for your insurance to be billed (if applicable) for the services provided during this visit. Depending on your insurance coverage, you may receive a charge related to this service.  I need to obtain your verbal consent now. Are you willing to proceed with your visit today? Sara Hunt has provided verbal consent on 03/28/2023 for a virtual visit (video or telephone). Jannifer Rodney, FNP  Date: 03/28/2023 12:16 PM   Virtual Visit via Video Note   I, Jannifer Rodney, connected with  Sara Hunt  ([id number][birth year]) on 03/28/23 at 12:15 PM EDT by a video-enabled telemedicine application and verified that I am speaking with the correct person using two identifiers.  Location: Patient: Virtual Visit Location Patient:  Home Provider: Virtual Visit Location Provider: Home Office   I discussed the limitations of evaluation and management by telemedicine and the availability of in person appointments. The patient expressed understanding and agreed to proceed.    History of Present Illness: Sara Hunt is a 48 y.o. who identifies as a female who was assigned female at birth, and is being seen today for vaginal discharge.  HPI: Vaginal Itching The patient's primary symptoms include genital itching and vaginal discharge. The patient's pertinent negatives include no genital odor. The current episode started in the past 7 days. Associated symptoms include dysuria. Pertinent negatives include no diarrhea or discolored urine. The vaginal discharge was clear. There has been no bleeding. She has tried antifungals for the symptoms. The treatment provided mild relief.    Problems:  Patient Active Problem List   Diagnosis Date Noted   Burning tongue 06/30/2022   Allergic rhinitis due to allergen 05/30/2021   Other fatigue 01/07/2018   Essential hypertension    Neurodegenerative gait disorder 09/28/2012   PSVT (paroxysmal supraventricular tachycardia) (HCC)    Routine health maintenance 02/27/2011   Vitamin D deficiency 09/07/2010   Morbid obesity (HCC) 10/15/2009   Hyperlipidemia 07/12/2009    Allergies:  Allergies  Allergen Reactions   Latex Rash   Medications:  Current Outpatient Medications:    fluconazole (DIFLUCAN) 150 MG tablet, Take 1 tablet (150 mg total) by mouth every three (3) days as needed., Disp: 2 tablet, Rfl: 0   metroNIDAZOLE (FLAGYL) 500 MG tablet,  Take 1 tablet (500 mg total) by mouth 2 (two) times daily., Disp: 14 tablet, Rfl: 0   cyclobenzaprine (FLEXERIL) 10 MG tablet, Take 1 tablet (10 mg total) by mouth 3 (three) times daily as needed., Disp: 15 tablet, Rfl: 0   ibuprofen (ADVIL,MOTRIN) 600 MG tablet, Take 1 tablet (600 mg total) by mouth every 6 (six) hours as needed for moderate  pain., Disp: 30 tablet, Rfl: 0   meloxicam (MOBIC) 15 MG tablet, Take 1 tablet (15 mg total) by mouth daily., Disp: 15 tablet, Rfl: 0   Menthol, Topical Analgesic, (BENGAY EX), Apply 1 application topically daily as needed (muscle pain)., Disp: , Rfl:    metoprolol succinate (TOPROL-XL) 50 MG 24 hr tablet, TAKE 1 TABLET BY MOUTH ONCE DAILY ANNUAL  APPT  DUE  IN  APRIL  --MUST  SEE  PROVIDER  FOR  FUTURE  REFILLS, Disp: 90 tablet, Rfl: 0   Multiple Vitamin (MULTIVITAMIN WITH MINERALS) TABS tablet, Take 1 tablet by mouth daily., Disp: , Rfl:    rosuvastatin (CRESTOR) 10 MG tablet, Take 1 tablet (10 mg total) by mouth daily., Disp: 90 tablet, Rfl: 3  Observations/Objective: Patient is well-developed, well-nourished in no acute distress.  Resting comfortably  at home.  Head is normocephalic, atraumatic.  No labored breathing.  Speech is clear and coherent with logical content.  Patient is alert and oriented at baseline.    Assessment and Plan: 1. BV (bacterial vaginosis) (Primary) - metroNIDAZOLE (FLAGYL) 500 MG tablet; Take 1 tablet (500 mg total) by mouth 2 (two) times daily.  Dispense: 14 tablet; Refill: 0  Keep clean and dry Flagyl BID  Follow up if symptoms worsen or do not improve  Follow Up Instructions: I discussed the assessment and treatment plan with the patient. The patient was provided an opportunity to ask questions and all were answered. The patient agreed with the plan and demonstrated an understanding of the instructions.  A copy of instructions were sent to the patient via MyChart unless otherwise noted below.     The patient was advised to call back or seek an in-person evaluation if the symptoms worsen or if the condition fails to improve as anticipated.    Jannifer Rodney, FNP

## 2023-03-29 ENCOUNTER — Ambulatory Visit: Admitting: Family Medicine

## 2023-03-29 ENCOUNTER — Encounter: Payer: Self-pay | Admitting: Family Medicine

## 2023-03-29 MED ORDER — ZEPBOUND 7.5 MG/0.5ML ~~LOC~~ SOAJ: 7.5000 mg | SUBCUTANEOUS | 0 refills | Status: DC

## 2023-03-29 MED ORDER — ZEPBOUND 5 MG/0.5ML ~~LOC~~ SOAJ: 5.0000 mg | SUBCUTANEOUS | 0 refills | Status: DC

## 2023-03-29 MED ORDER — ZEPBOUND 2.5 MG/0.5ML ~~LOC~~ SOAJ: 2.5000 mg | SUBCUTANEOUS | 0 refills | Status: DC

## 2023-03-29 NOTE — Assessment & Plan Note (Signed)
 I have had an extensive 30 minute conversation today with the patient about healthy eating habits, exercise, calorie and carb goals for sustainable and successful weight loss. I gave the patient caloric and protein daily intake values as well as described the importance of increasing fiber and water intake. I discussed weight loss medications that could be used in the treatment of this patient. Handouts on low carb eating were given to the patient.    Total calories per day: 1800  Total carbohydrates per day: less than 100 grams of carbs per day  Total protein per day: 120 grams per day  Pt has both HLD and HTN which are both comorbid conditions associates with her obesity. Patient would be an excellent candidate for weight loss medication, however I need her BMI before I can order the wegovy, which means I need an accurate weight. Patient is an excellent candidate for weight loss therapy, she will be engaging in dietary modification (plan listed above) as well and exercise (patient is mostly in a wheelchair due to chronic back pain but can do upper body exercises).

## 2023-03-29 NOTE — Progress Notes (Signed)
 Established Patient Office Visit  Subjective   Patient ID: Sara Hunt, female    DOB: 10/27/1975  Age: 48 y.o. MRN: [id number]  Chief Complaint  Patient presents with   Weight Check    Morbid obesity-- patient states that she is interested in weight loss therapy. States that she has tried limiting portions, has tried meal planning, has tried limiting sweets as well. Has never tried weight watchers or any other specific diets. States that she has never tried any exercise programs in the past either-- cannot easily participate in exercise due to her chronic spine issues. Pt reports she cannot get on our scale today to get an accurate weight. Weight was performed today, BMI is 39.72. we discussed again about diet control. I went over the patient's dietary goals that were calculated last month. Pt confirms she still has the handouts I gave her at home.     Current Outpatient Medications  Medication Instructions   cyclobenzaprine (FLEXERIL) 10 mg, Oral, 3 times daily PRN   fluconazole (DIFLUCAN) 150 mg, Oral, Every 3 DAYS PRN   ibuprofen (ADVIL) 600 mg, Oral, Every 6 hours PRN   meloxicam (MOBIC) 15 mg, Oral, Daily   Menthol, Topical Analgesic, (BENGAY EX) 1 application , Daily PRN   metoprolol succinate (TOPROL-XL) 50 MG 24 hr tablet TAKE 1 TABLET BY MOUTH ONCE DAILY ANNUAL  APPT  DUE  IN  APRIL  --MUST  SEE  PROVIDER  FOR  FUTURE  REFILLS   metroNIDAZOLE (FLAGYL) 500 mg, Oral, 2 times daily   Multiple Vitamin (MULTIVITAMIN WITH MINERALS) TABS tablet 1 tablet, Daily   rosuvastatin (CRESTOR) 10 mg, Oral, Daily   Zepbound 2.5 mg, Subcutaneous, Weekly   Zepbound 5 mg, Subcutaneous, Weekly   Zepbound 7.5 mg, Subcutaneous, Weekly    Patient Active Problem List   Diagnosis Date Noted   Burning tongue 06/30/2022   Allergic rhinitis due to allergen 05/30/2021   Other fatigue 01/07/2018   Essential hypertension    Neurodegenerative gait disorder 09/28/2012   PSVT (paroxysmal  supraventricular tachycardia) (HCC)    Routine health maintenance 02/27/2011   Vitamin D deficiency 09/07/2010   Morbid obesity (HCC) 10/15/2009   Hyperlipidemia 07/12/2009      Review of Systems  All other systems reviewed and are negative.     Objective:     BP (!) 160/90 Comment: BP performed by PCP--jaf  Pulse 80   Temp 98.4 F (36.9 C) (Oral)   Ht 5\' 6"  (1.676 m)   Wt 246 lb 1.6 oz (111.6 kg)   LMP 10/20/2017 (Exact Date)   SpO2 99%   BMI 39.72 kg/m    Physical Exam Vitals reviewed.  Constitutional:      Appearance: Normal appearance. She is obese.  Cardiovascular:     Rate and Rhythm: Normal rate and regular rhythm.     Heart sounds: Normal heart sounds. No murmur heard. Pulmonary:     Effort: Pulmonary effort is normal.  Neurological:     Mental Status: She is alert and oriented to person, place, and time. Mental status is at baseline.  Psychiatric:        Mood and Affect: Mood normal.        Behavior: Behavior normal.      No results found for any visits on 03/29/23.    The 10-year ASCVD risk score (Arnett DK, et al., 2019) is: 16.3%    Assessment & Plan:  Morbid obesity (HCC) Assessment & Plan: I have had  an extensive 30 minute conversation today with the patient about healthy eating habits, exercise, calorie and carb goals for sustainable and successful weight loss. I gave the patient caloric and protein daily intake values as well as described the importance of increasing fiber and water intake. I discussed weight loss medications that could be used in the treatment of this patient. Handouts on low carb eating were given to the patient.    Total calories per day: 1800  Total carbohydrates per day: less than 100 grams of carbs per day  Total protein per day: 120 grams per day  Pt has both HLD and HTN which are both comorbid conditions associates with her obesity. Patient would be an excellent candidate for weight loss medication, however I  need her BMI before I can order the wegovy, which means I need an accurate weight. Patient is an excellent candidate for weight loss therapy, she will be engaging in dietary modification (plan listed above) as well and exercise (patient is mostly in a wheelchair due to chronic back pain but can do upper body exercises).   Orders: -     Zepbound; Inject 2.5 mg into the skin once a week.  Dispense: 2 mL; Refill: 0 -     Zepbound; Inject 5 mg into the skin once a week.  Dispense: 2 mL; Refill: 0 -     Zepbound; Inject 7.5 mg into the skin once a week.  Dispense: 2 mL; Refill: 0     Return in about 3 months (around 06/29/2023).    Karie Georges, MD

## 2023-03-29 NOTE — Patient Instructions (Addendum)
 Total calories per day: 1800  Total carbohydrates per day: less than 100 grams of carbs per day  Total protein per day: 120 grams per day

## 2023-03-30 ENCOUNTER — Telehealth: Payer: Self-pay | Admitting: Pharmacist

## 2023-03-30 NOTE — Telephone Encounter (Signed)
 Pharmacy Patient Advocate Encounter   Received notification from Patient Pharmacy that prior authorization for Zepbound 2.5MG /0.5ML pen-injectors is required/requested.   Insurance verification completed.   The patient is insured through [facility] .   Per test claim: PA required; PA submitted to above mentioned insurance via CoverMyMeds Key/confirmation #/EOC [id number] Status is pending

## 2023-03-31 MED ORDER — AMLODIPINE BESYLATE 5 MG PO TABS: 5.0000 mg | ORAL_TABLET | Freq: Every day | ORAL | 0 refills | Status: DC

## 2023-03-31 NOTE — Addendum Note (Signed)
 Addended by: Karie Georges on: 03/31/2023 09:34 AM   Modules accepted: Orders

## 2023-03-31 NOTE — Telephone Encounter (Signed)
 Pharmacy Patient Advocate Encounter  Received notification from [facility] that Prior Authorization for Zepbound 2.5MG /0.5ML pen-injectors has been APPROVED from 03/30/2023 to 09/30/2023   PA #/Case ID/Reference #: [id number]

## 2023-04-09 NOTE — Telephone Encounter (Signed)
 Finished and will place on your desk

## 2023-04-29 ENCOUNTER — Other Ambulatory Visit: Payer: Self-pay | Admitting: Internal Medicine

## 2023-04-29 MED ORDER — TELMISARTAN 20 MG PO TABS: 20.0000 mg | ORAL_TABLET | Freq: Every day | ORAL | 1 refills | Status: DC

## 2023-04-29 NOTE — Addendum Note (Signed)
 Addended by: Aida House on: 04/29/2023 12:36 PM   Modules accepted: Orders

## 2023-04-30 ENCOUNTER — Other Ambulatory Visit: Payer: Self-pay | Admitting: Family Medicine

## 2023-04-30 NOTE — Telephone Encounter (Signed)
 Copied from CRM [id number]. Topic: Clinical - Medication Refill >> Apr 30, 2023  4:15 PM Keitha Pata L wrote: Most Recent Primary Care Visit:  Provider: Aida House  Department: [facility]  Visit Type: OFFICE VISIT  Date: 03/29/2023  Medication: metoprolol  succinate (TOPROL -XL) 50 MG 24 hr tablet  Has the patient contacted their pharmacy? Yes (Agent: If no, request that the patient contact the pharmacy for the refill. If patient does not wish to contact the pharmacy document the reason why and proceed with request.) (Agent: If yes, when and what did the pharmacy advise?)  Is this the correct pharmacy for this prescription? Yes If no, delete pharmacy and type the correct one.  This is the patient's preferred pharmacy:  [facility] [address], [place] - [address] [place] [address] Phone: [phone] Fax: [phone]   Has the prescription been filled recently? No  Is the patient out of the medication? Yes  Has the patient been seen for an appointment in the last year OR does the patient have an upcoming appointment? No  Can we respond through MyChart? Yes  Agent: Please be advised that Rx refills may take up to 3 business days. We ask that you follow-up with your pharmacy.

## 2023-04-30 NOTE — Telephone Encounter (Signed)
 Copied from CRM [id number]. Topic: Clinical - Medication Refill >> Apr 30, 2023  4:25 PM Earnestine Goes B wrote: Most Recent Primary Care Visit:  Provider: Aida House  Department: [facility]  Visit Type: OFFICE VISIT  Date: 03/29/2023  Medication: metoprolol  succinate (TOPROL -XL) 50 MG 24 hr table  Has the patient contacted their pharmacy? Yes (Agent: If no, request that the patient contact the pharmacy for the refill. If patient does not wish to contact the pharmacy document the reason why and proceed with request.) (Agent: If yes, when and what did the pharmacy advise?)  Is this the correct pharmacy for this prescription? Yes If no, delete pharmacy and type the correct one.  This is the patient's preferred pharmacy:    [id number] - [place], [place] - [address] AT [facility] OF [address] [place] [address] Phone: [phone] Fax: [phone]   Has the prescription been filled recently? Yes  Is the patient out of the medication? Yes  Has the patient been seen for an appointment in the last year OR does the patient have an upcoming appointment? Yes  Can we respond through MyChart? Yes  Agent: Please be advised that Rx refills may take up to 3 business days. We ask that you follow-up with your pharmacy.

## 2023-05-03 ENCOUNTER — Other Ambulatory Visit: Payer: Self-pay | Admitting: Family Medicine

## 2023-05-03 ENCOUNTER — Ambulatory Visit: Payer: Self-pay

## 2023-05-03 ENCOUNTER — Encounter: Payer: Self-pay | Admitting: Family Medicine

## 2023-05-03 DIAGNOSIS — B9689 Other specified bacterial agents as the cause of diseases classified elsewhere: Secondary | ICD-10-CM

## 2023-05-03 MED ORDER — METOPROLOL SUCCINATE ER 50 MG PO TB24: 50.0000 mg | ORAL_TABLET | Freq: Every day | ORAL | 1 refills | Status: DC

## 2023-05-03 NOTE — Telephone Encounter (Signed)
 Copied from CRM [id number]. Topic: Clinical - Medication Refill >> May 03, 2023  2:40 PM Palma Bob wrote: Most Recent Primary Care Visit:  Provider: Aida House  Department: [facility]  Visit Type: OFFICE VISIT  Date: 03/29/2023  Medication: metoprolol  succinate (TOPROL -XL) 50 MG 24 hr tablet  Has the patient contacted their pharmacy? Yes (Agent: If no, request that the patient contact the pharmacy for the refill. If patient does not wish to contact the pharmacy document the reason why and proceed with request.) (Agent: If yes, when and what did the pharmacy advise?)  Is this the correct pharmacy for this prescription? Yes If no, delete pharmacy and type the correct one.  This is the patient's preferred pharmacy:  [facility] [address], [place] - [address] [place] [address] Phone: [phone] Fax: [phone]     Has the prescription been filled recently? No  Is the patient out of the medication? Yes  Has the patient been seen for an appointment in the last year OR does the patient have an upcoming appointment? Yes  Can we respond through MyChart? Yes  Agent: Please be advised that Rx refills may take up to 3 business days. We ask that you follow-up with your pharmacy. This encounter was created in error - please disregard.

## 2023-05-03 NOTE — Telephone Encounter (Signed)
 Copied from CRM [id number]. Topic: Clinical - Medication Refill >> May 03, 2023  2:40 PM Palma Bob wrote: Most Recent Primary Care Visit:  Provider: Aida House  Department: [facility]  Visit Type: OFFICE VISIT  Date: 03/29/2023  Medication: metoprolol  succinate (TOPROL -XL) 50 MG 24 hr tablet  Has the patient contacted their pharmacy? Yes (Agent: If no, request that the patient contact the pharmacy for the refill. If patient does not wish to contact the pharmacy document the reason why and proceed with request.) (Agent: If yes, when and what did the pharmacy advise?)  Is this the correct pharmacy for this prescription? Yes If no, delete pharmacy and type the correct one.  This is the patient's preferred pharmacy:  [facility] [address], [place] - [address] [place] [address] Phone: [phone] Fax: [phone]     Has the prescription been filled recently? No  Is the patient out of the medication? Yes  Has the patient been seen for an appointment in the last year OR does the patient have an upcoming appointment? Yes  Can we respond through MyChart? Yes  Agent: Please be advised that Rx refills may take up to 3 business days. We ask that you follow-up with your pharmacy.

## 2023-05-03 NOTE — Telephone Encounter (Signed)
 Pt reports she called in Friday and was advised office was closed, no notes for refill request in chart. Pt has been out of this medication for 3 days. She is not currently symptomatic at this time. Pharmacy updated. This RN educated pt on home care, new-worsening symptoms, when to call back/seek emergent care. Pt verbalized understanding and agrees to plan.    Reason for Disposition  [1] Prescription refill request for ESSENTIAL medicine (i.e., likelihood of harm to patient if not taken) AND [2] triager unable to refill per department policy  Answer Assessment - Initial Assessment Questions 1. DRUG NAME: "What medicine do you need to have refilled?"     Metoprolol  2. REFILLS REMAINING: "How many refills are remaining?" (Note: The label on the medicine or pill bottle will show how many refills are remaining. If there are no refills remaining, then a renewal may be needed.)     0 4. PRESCRIBING HCP: "Who prescribed it?" Reason: If prescribed by specialist, call should be referred to that group.     PCP 5. SYMPTOMS: "Do you have any symptoms?"     None  Protocols used: Medication Refill and Renewal Call-A-AH

## 2023-05-10 ENCOUNTER — Other Ambulatory Visit: Payer: Self-pay | Admitting: Family Medicine

## 2023-05-11 ENCOUNTER — Other Ambulatory Visit: Payer: Self-pay | Admitting: Family Medicine

## 2023-05-11 MED ORDER — ZEPBOUND 7.5 MG/0.5ML ~~LOC~~ SOAJ: 7.5000 mg | SUBCUTANEOUS | 0 refills | Status: DC

## 2023-05-12 ENCOUNTER — Encounter: Payer: Self-pay | Admitting: Family Medicine

## 2023-05-12 ENCOUNTER — Telehealth

## 2023-05-12 ENCOUNTER — Encounter: Payer: Self-pay | Admitting: Nurse Practitioner

## 2023-05-12 DIAGNOSIS — M5442 Lumbago with sciatica, left side: Secondary | ICD-10-CM

## 2023-05-12 NOTE — Telephone Encounter (Signed)
 Patient called to check in and make sure her message was received that she sent this morning. I let patient know we received her message.

## 2023-05-13 ENCOUNTER — Telehealth: Payer: Self-pay | Admitting: Physician Assistant

## 2023-05-13 DIAGNOSIS — M25572 Pain in left ankle and joints of left foot: Secondary | ICD-10-CM | POA: Diagnosis not present

## 2023-05-13 DIAGNOSIS — G8929 Other chronic pain: Secondary | ICD-10-CM

## 2023-05-13 MED ORDER — MELOXICAM 15 MG PO TABS: 15.0000 mg | ORAL_TABLET | Freq: Every day | ORAL | 0 refills | Status: DC

## 2023-05-13 NOTE — Progress Notes (Signed)
 Virtual Visit Consent   Sara Hunt, you are scheduled for a virtual visit with a [facility] provider today. Just as with appointments in the office, your consent must be obtained to participate. Your consent will be active for this visit and any virtual visit you may have with one of our providers in the next 365 days. If you have a MyChart account, a copy of this consent can be sent to you electronically.  As this is a virtual visit, video technology does not allow for your provider to perform a traditional examination. This may limit your provider's ability to fully assess your condition. If your provider identifies any concerns that need to be evaluated in person or the need to arrange testing (such as labs, EKG, etc.), we will make arrangements to do so. Although advances in technology are sophisticated, we cannot ensure that it will always work on either your end or our end. If the connection with a video visit is poor, the visit may have to be switched to a telephone visit. With either a video or telephone visit, we are not always able to ensure that we have a secure connection.  By engaging in this virtual visit, you consent to the provision of healthcare and authorize for your insurance to be billed (if applicable) for the services provided during this visit. Depending on your insurance coverage, you may receive a charge related to this service.  I need to obtain your verbal consent now. Are you willing to proceed with your visit today? Sara Hunt has provided verbal consent on 05/13/2023 for a virtual visit (video or telephone). Angelia Kelp, PA-C  Date: 05/13/2023 8:22 AM   Virtual Visit via Video Note   I, Angelia Kelp, connected with  Sara Hunt  ([id number], 04-27-1975) on 05/13/23 at  8:15 AM EDT by a video-enabled telemedicine application and verified that I am speaking with the correct person using two identifiers.  Location: Patient: Virtual Visit Location  Patient: Home Provider: Virtual Visit Location Provider: Home Office   I discussed the limitations of evaluation and management by telemedicine and the availability of in person appointments. The patient expressed understanding and agreed to proceed.    History of Present Illness: Sara Hunt is a 48 y.o. who identifies as a female who was assigned female at birth, and is being seen today for recurrent ankle sprain. Patient does most often get around in a wheelchair, but is able to stand and ambulate with a walker short distances. Yesterday when she went to stand she felt her left ankle give a little and had pain in it instantly. She has had this recurrently, most recently around [facility] 2024. She did notice some swelling starting in it last night and this morning and it is still tender to bear weight.   Problems:  Patient Active Problem List   Diagnosis Date Noted   Allergic rhinitis due to allergen 05/30/2021   Other fatigue 01/07/2018   Essential hypertension    Neurodegenerative gait disorder 09/28/2012   PSVT (paroxysmal supraventricular tachycardia) (HCC)    Routine health maintenance 02/27/2011   Vitamin D  deficiency 09/07/2010   Morbid obesity (HCC) 10/15/2009   Hyperlipidemia 07/12/2009    Allergies:  Allergies  Allergen Reactions   Latex Rash   Medications:  Current Outpatient Medications:    meloxicam  (MOBIC ) 15 MG tablet, Take 1 tablet (15 mg total) by mouth daily., Disp: 30 tablet, Rfl: 0   amLODipine  (NORVASC ) 5 MG tablet,  Take 5 mg by mouth daily., Disp: , Rfl:    cyclobenzaprine  (FLEXERIL ) 10 MG tablet, Take 1 tablet (10 mg total) by mouth 3 (three) times daily as needed., Disp: 15 tablet, Rfl: 0   ibuprofen  (ADVIL ,MOTRIN ) 600 MG tablet, Take 1 tablet (600 mg total) by mouth every 6 (six) hours as needed for moderate pain., Disp: 30 tablet, Rfl: 0   Menthol , Topical Analgesic, (BENGAY EX), Apply 1 application topically daily as needed (muscle pain)., Disp: , Rfl:     metoprolol  succinate (TOPROL -XL) 50 MG 24 hr tablet, Take 1 tablet (50 mg total) by mouth daily. Take with or immediately following a meal., Disp: 90 tablet, Rfl: 1   Multiple Vitamin (MULTIVITAMIN WITH MINERALS) TABS tablet, Take 1 tablet by mouth daily., Disp: , Rfl:    rosuvastatin  (CRESTOR ) 10 MG tablet, Take 1 tablet (10 mg total) by mouth daily., Disp: 90 tablet, Rfl: 3   telmisartan  (MICARDIS ) 20 MG tablet, Take 1 tablet (20 mg total) by mouth daily., Disp: 90 tablet, Rfl: 1   tirzepatide  (ZEPBOUND ) 7.5 MG/0.5ML Pen, Inject 7.5 mg into the skin once a week., Disp: 2 mL, Rfl: 0  Observations/Objective: Patient is well-developed, well-nourished in no acute distress.  Resting comfortably at home.  Head is normocephalic, atraumatic.  No labored breathing.  Speech is clear and coherent with logical content.  Patient is alert and oriented at baseline.    Assessment and Plan: 1. Chronic pain of left ankle (Primary) - meloxicam  (MOBIC ) 15 MG tablet; Take 1 tablet (15 mg total) by mouth daily.  Dispense: 30 tablet; Refill: 0  - Meloxicam  for inflammatory pain - Rest, Ice, Use compression wrap, Elevate - Tylenol  for breakthrough pain - Seek in person evaluation if worsening or fails to improve  Follow Up Instructions: I discussed the assessment and treatment plan with the patient. The patient was provided an opportunity to ask questions and all were answered. The patient agreed with the plan and demonstrated an understanding of the instructions.  A copy of instructions were sent to the patient via MyChart unless otherwise noted below.    The patient was advised to call back or seek an in-person evaluation if the symptoms worsen or if the condition fails to improve as anticipated.    Angelia Kelp, PA-C

## 2023-05-13 NOTE — Patient Instructions (Signed)
 Sara Hunt, thank you for joining Angelia Kelp, PA-C for today's virtual visit.  While this provider is not your primary care provider (PCP), if your PCP is located in our provider database this encounter information will be shared with them immediately following your visit.   A [facility] MyChart account gives you access to today's visit and all your visits, tests, and labs performed at [facility] " click here if you don't have a [facility] MyChart account or go to [email or web address]  Consent: (Patient) Sara Hunt provided verbal consent for this virtual visit at the beginning of the encounter.  Current Medications:  Current Outpatient Medications:    meloxicam  (MOBIC ) 15 MG tablet, Take 1 tablet (15 mg total) by mouth daily., Disp: 30 tablet, Rfl: 0   amLODipine  (NORVASC ) 5 MG tablet, Take 5 mg by mouth daily., Disp: , Rfl:    cyclobenzaprine  (FLEXERIL ) 10 MG tablet, Take 1 tablet (10 mg total) by mouth 3 (three) times daily as needed., Disp: 15 tablet, Rfl: 0   ibuprofen  (ADVIL ,MOTRIN ) 600 MG tablet, Take 1 tablet (600 mg total) by mouth every 6 (six) hours as needed for moderate pain., Disp: 30 tablet, Rfl: 0   Menthol , Topical Analgesic, (BENGAY EX), Apply 1 application topically daily as needed (muscle pain)., Disp: , Rfl:    metoprolol  succinate (TOPROL -XL) 50 MG 24 hr tablet, Take 1 tablet (50 mg total) by mouth daily. Take with or immediately following a meal., Disp: 90 tablet, Rfl: 1   Multiple Vitamin (MULTIVITAMIN WITH MINERALS) TABS tablet, Take 1 tablet by mouth daily., Disp: , Rfl:    rosuvastatin  (CRESTOR ) 10 MG tablet, Take 1 tablet (10 mg total) by mouth daily., Disp: 90 tablet, Rfl: 3   telmisartan  (MICARDIS ) 20 MG tablet, Take 1 tablet (20 mg total) by mouth daily., Disp: 90 tablet, Rfl: 1   tirzepatide  (ZEPBOUND ) 7.5 MG/0.5ML Pen, Inject 7.5 mg into the skin once a week., Disp: 2 mL, Rfl: 0   Medications ordered in this encounter:  Meds  ordered this encounter  Medications   meloxicam  (MOBIC ) 15 MG tablet    Sig: Take 1 tablet (15 mg total) by mouth daily.    Dispense:  30 tablet    Refill:  0    Supervising Provider:   LAMPTEY, PHILIP O [[id number]]     *If you need refills on other medications prior to your next appointment, please contact your pharmacy*  Follow-Up: Call back or seek an in-person evaluation if the symptoms worsen or if the condition fails to improve as anticipated.  [facility] [phone]  Other Instructions Ankle Sprain  An ankle sprain is a stretch or tear in a ligament in the ankle. Ligaments are tissues that connect bones to each other. The two most common types of ankle sprains are: Inversion sprain. This happens when the foot turns inward and the ankle rolls outward. It affects the ligament on the outside of the foot (lateral ligament). Eversion sprain. This happens when the foot turns outward and the ankle rolls inward. It affects the ligament on the inner side of the foot (medial ligament). What are the causes? An ankle sprain is often caused by rolling or twisting the ankle by accident. What increases the risk? You are more likely to get an ankle sprain if you play sports. What are the signs or symptoms? Symptoms of an ankle sprain include: Pain in your ankle. Swelling. Bruising. Bruises may form right after you sprain your  ankle or 1-2 days later. Trouble standing or walking. This includes trouble turning or changing directions. How is this diagnosed? An ankle sprain is diagnosed with a physical exam. Your health care provider will press on parts of your foot and ankle and try to move them in certain ways. You may also have X-rays taken. These may be done to see how severe the sprain is and to check for broken bones. How is this treated? An ankle sprain may be treated with: A brace or splint. This is used to keep the ankle from moving until it heals. An elastic bandage  (dressing). This is used to support the ankle. Crutches. Pain medicine. Surgery. This may be needed if the sprain is severe. Physical therapy. This may help to improve the range of motion in the ankle. Follow these instructions at home: If you have a removable brace or a splint: Wear the brace or splint as told by your provider. Remove it only as told by your provider. Check the skin around the brace or splint every day. Tell your provider about any concerns. Loosen the brace or splint if your toes tingle, become numb, or turn cold and blue. Keep the brace or splint clean. If the brace or splint is not waterproof: Do not let it get wet. Cover it with a watertight covering when you take a bath or a shower. If you have an elastic dressing: Take the dressing off to shower or bathe. If the dressing feels too tight, adjust it to make it more comfortable. Loosen the dressing if your foot tingles, becomes numb, or turns cold and blue. Managing pain, stiffness, and swelling If told, put ice on the affected area. If you have a removable brace or splint, remove it as told by your provider. Put ice in a plastic bag. Place a towel between your skin and the bag. Leave the ice on for 20 minutes, 2-3 times a day. Remove the ice if your skin turns bright red. This is very important. If you cannot feel pain, heat, or cold, you have a greater risk of damage to the area. If your skin turns bright red, remove the ice right away to prevent skin damage. The risk of damage is higher if you cannot feel pain, heat, or cold. Move your toes often to reduce stiffness and swelling. For 2-3 days, raise (elevate) your ankle above the level of your heart while you are sitting or lying down. General instructions Take over-the-counter and prescription medicines only as told by your provider. Do not use any products that contain nicotine or tobacco. These products include cigarettes, chewing tobacco, and vaping devices,  such as e-cigarettes. If you need help quitting, ask your provider. Rest your ankle. Do not use your ankle to support your body weight until your provider says that you can. Use crutches as told by your provider. Ask your provider when it is safe to drive if you have a brace or splint on your ankle. Contact a health care provider if: You have bruising or swelling that get worse all of a sudden. Your pain does not get better with medicine. Get help right away if: Your foot or toes become numb or blue. You have severe pain that gets worse. This information is not intended to replace advice given to you by your health care provider. Make sure you discuss any questions you have with your health care provider. Document Revised: 09/24/2021 Document Reviewed: 09/24/2021 Elsevier Patient Education  2024 [employer].  If you have been instructed to have an in-person evaluation today at a local Urgent Care facility, please use the link below. It will take you to a list of all of our available [facility], including address, phone number and hours of operation. Please do not delay care.  [facility]  If you or a family member do not have a primary care provider, use the link below to schedule a visit and establish care. When you choose a [facility] primary care physician or advanced practice provider, you gain a long-term partner in health. Find a Primary Care Provider  Learn more about [facility]'s in-office and virtual care options: [facility] - Get Care Now

## 2023-05-14 MED ORDER — CYCLOBENZAPRINE HCL 10 MG PO TABS: 10.0000 mg | ORAL_TABLET | Freq: Three times a day (TID) | ORAL | 0 refills | Status: DC | PRN

## 2023-05-14 MED ORDER — MELOXICAM 15 MG PO TABS: 15.0000 mg | ORAL_TABLET | Freq: Every day | ORAL | 1 refills | Status: DC | PRN

## 2023-07-15 ENCOUNTER — Other Ambulatory Visit: Payer: Self-pay | Admitting: Family Medicine

## 2023-07-16 ENCOUNTER — Telehealth: Admitting: Physician Assistant

## 2023-07-16 DIAGNOSIS — T3695XA Adverse effect of unspecified systemic antibiotic, initial encounter: Secondary | ICD-10-CM | POA: Diagnosis not present

## 2023-07-16 DIAGNOSIS — J019 Acute sinusitis, unspecified: Secondary | ICD-10-CM

## 2023-07-16 DIAGNOSIS — B9689 Other specified bacterial agents as the cause of diseases classified elsewhere: Secondary | ICD-10-CM

## 2023-07-16 DIAGNOSIS — B379 Candidiasis, unspecified: Secondary | ICD-10-CM

## 2023-07-16 MED ORDER — FLUCONAZOLE 150 MG PO TABS
150.0000 mg | ORAL_TABLET | ORAL | 0 refills | Status: DC | PRN
Start: 2023-07-16 — End: 2023-08-09

## 2023-07-16 MED ORDER — AMOXICILLIN-POT CLAVULANATE 875-125 MG PO TABS: 1.0000 | ORAL_TABLET | Freq: Two times a day (BID) | ORAL | 0 refills | Status: DC

## 2023-07-16 NOTE — Progress Notes (Signed)
 Virtual Visit Consent   Sara Hunt, you are scheduled for a virtual visit with a [facility] provider today. Just as with appointments in the office, your consent must be obtained to participate. Your consent will be active for this visit and any virtual visit you may have with one of our providers in the next 365 days. If you have a MyChart account, a copy of this consent can be sent to you electronically.  As this is a virtual visit, video technology does not allow for your provider to perform a traditional examination. This may limit your provider's ability to fully assess your condition. If your provider identifies any concerns that need to be evaluated in person or the need to arrange testing (such as labs, EKG, etc.), we will make arrangements to do so. Although advances in technology are sophisticated, we cannot ensure that it will always work on either your end or our end. If the connection with a video visit is poor, the visit may have to be switched to a telephone visit. With either a video or telephone visit, we are not always able to ensure that we have a secure connection.  By engaging in this virtual visit, you consent to the provision of healthcare and authorize for your insurance to be billed (if applicable) for the services provided during this visit. Depending on your insurance coverage, you may receive a charge related to this service.  I need to obtain your verbal consent now. Are you willing to proceed with your visit today? Sara Hunt has provided verbal consent on 07/16/2023 for a virtual visit (video or telephone). Delon CHRISTELLA Dickinson, PA-C  Date: 07/16/2023 2:52 PM   Virtual Visit via Video Note   I, Delon CHRISTELLA Dickinson, connected with  Sara Hunt  ([id number], Aug 18, 1975) on 07/16/23 at  2:45 PM EDT by a video-enabled telemedicine application and verified that I am speaking with the correct person using two identifiers.  Location: Patient: Virtual Visit Location  Patient: Home Provider: Virtual Visit Location Provider: Home Office   I discussed the limitations of evaluation and management by telemedicine and the availability of in person appointments. The patient expressed understanding and agreed to proceed.    History of Present Illness: Sara Hunt is a 48 y.o. who identifies as a female who was assigned female at birth, and is being seen today for sinus congestion.  HPI: Sinusitis This is a new problem. The current episode started 1 to 4 weeks ago. The problem has been gradually worsening since onset. There has been no fever. The pain is moderate. Associated symptoms include congestion, coughing (with dryness and talking), ear pain, headaches, sinus pressure and a sore throat. Pertinent negatives include no chills, diaphoresis, hoarse voice, neck pain, shortness of breath or swollen glands. Treatments tried: tylenol  sinus. The treatment provided no relief.    Negative at home Covid 19 test.   Problems:  Patient Active Problem List   Diagnosis Date Noted   Allergic rhinitis due to allergen 05/30/2021   Other fatigue 01/07/2018   Essential hypertension    Neurodegenerative gait disorder 09/28/2012   PSVT (paroxysmal supraventricular tachycardia) (HCC)    Routine health maintenance 02/27/2011   Vitamin D  deficiency 09/07/2010   Morbid obesity (HCC) 10/15/2009   Hyperlipidemia 07/12/2009    Allergies:  Allergies  Allergen Reactions   Latex Rash   Medications:  Current Outpatient Medications:    amoxicillin -clavulanate (AUGMENTIN ) 875-125 MG tablet, Take 1 tablet by mouth 2 (two) times  daily., Disp: 20 tablet, Rfl: 0   fluconazole  (DIFLUCAN ) 150 MG tablet, Take 1 tablet (150 mg total) by mouth every 3 (three) days as needed., Disp: 3 tablet, Rfl: 0   amLODipine  (NORVASC ) 5 MG tablet, Take 5 mg by mouth daily., Disp: , Rfl:    cyclobenzaprine  (FLEXERIL ) 10 MG tablet, Take 1 tablet (10 mg total) by mouth 3 (three) times daily as needed.,  Disp: 15 tablet, Rfl: 0   ibuprofen  (ADVIL ,MOTRIN ) 600 MG tablet, Take 1 tablet (600 mg total) by mouth every 6 (six) hours as needed for moderate pain., Disp: 30 tablet, Rfl: 0   meloxicam  (MOBIC ) 15 MG tablet, Take 1 tablet (15 mg total) by mouth daily as needed., Disp: 30 tablet, Rfl: 1   Menthol , Topical Analgesic, (BENGAY EX), Apply 1 application topically daily as needed (muscle pain)., Disp: , Rfl:    metoprolol  succinate (TOPROL -XL) 50 MG 24 hr tablet, Take 1 tablet (50 mg total) by mouth daily. Take with or immediately following a meal., Disp: 90 tablet, Rfl: 1   Multiple Vitamin (MULTIVITAMIN WITH MINERALS) TABS tablet, Take 1 tablet by mouth daily., Disp: , Rfl:    rosuvastatin  (CRESTOR ) 10 MG tablet, Take 1 tablet (10 mg total) by mouth daily., Disp: 90 tablet, Rfl: 3   telmisartan  (MICARDIS ) 20 MG tablet, Take 1 tablet (20 mg total) by mouth daily., Disp: 90 tablet, Rfl: 1   tirzepatide  (ZEPBOUND ) 10 MG/0.5ML Pen, Inject 10 mg into the skin once a week., Disp: 2 mL, Rfl: 0  Observations/Objective: Patient is well-developed, well-nourished in no acute distress.  Resting comfortably at home.  Head is normocephalic, atraumatic.  No labored breathing.  Speech is clear and coherent with logical content.  Patient is alert and oriented at baseline.    Assessment and Plan: 1. Acute bacterial sinusitis (Primary) - amoxicillin -clavulanate (AUGMENTIN ) 875-125 MG tablet; Take 1 tablet by mouth 2 (two) times daily.  Dispense: 20 tablet; Refill: 0  2. Antibiotic-induced yeast infection - fluconazole  (DIFLUCAN ) 150 MG tablet; Take 1 tablet (150 mg total) by mouth every 3 (three) days as needed.  Dispense: 3 tablet; Refill: 0  - Worsening symptoms that have not responded to OTC medications.  - Will give Augmentin  - Continue allergy medications.  - Steam and humidifier can help - Stay well hydrated and get plenty of rest.  - Diflucan  given as prophylaxis as patient tends to get vaginal  yeast infections with antibiotic use - Seek in person evaluation if no symptom improvement or if symptoms worsen  Follow Up Instructions: I discussed the assessment and treatment plan with the patient. The patient was provided an opportunity to ask questions and all were answered. The patient agreed with the plan and demonstrated an understanding of the instructions.  A copy of instructions were sent to the patient via MyChart unless otherwise noted below.    The patient was advised to call back or seek an in-person evaluation if the symptoms worsen or if the condition fails to improve as anticipated.    Delon CHRISTELLA Dickinson, PA-C

## 2023-07-16 NOTE — Patient Instructions (Signed)
 Sara Hunt, thank you for joining Delon CHRISTELLA Dickinson, PA-C for today's virtual visit.  While this provider is not your primary care provider (PCP), if your PCP is located in our provider database this encounter information will be shared with them immediately following your visit.   A [facility] MyChart account gives you access to today's visit and all your visits, tests, and labs performed at [facility]  click here if you don't have a [facility] MyChart account or go to [email or web address]  Consent: (Patient) Sara Hunt provided verbal consent for this virtual visit at the beginning of the encounter.  Current Medications:  Current Outpatient Medications:    amoxicillin -clavulanate (AUGMENTIN ) 875-125 MG tablet, Take 1 tablet by mouth 2 (two) times daily., Disp: 20 tablet, Rfl: 0   fluconazole  (DIFLUCAN ) 150 MG tablet, Take 1 tablet (150 mg total) by mouth every 3 (three) days as needed., Disp: 3 tablet, Rfl: 0   amLODipine  (NORVASC ) 5 MG tablet, Take 5 mg by mouth daily., Disp: , Rfl:    cyclobenzaprine  (FLEXERIL ) 10 MG tablet, Take 1 tablet (10 mg total) by mouth 3 (three) times daily as needed., Disp: 15 tablet, Rfl: 0   ibuprofen  (ADVIL ,MOTRIN ) 600 MG tablet, Take 1 tablet (600 mg total) by mouth every 6 (six) hours as needed for moderate pain., Disp: 30 tablet, Rfl: 0   meloxicam  (MOBIC ) 15 MG tablet, Take 1 tablet (15 mg total) by mouth daily as needed., Disp: 30 tablet, Rfl: 1   Menthol , Topical Analgesic, (BENGAY EX), Apply 1 application topically daily as needed (muscle pain)., Disp: , Rfl:    metoprolol  succinate (TOPROL -XL) 50 MG 24 hr tablet, Take 1 tablet (50 mg total) by mouth daily. Take with or immediately following a meal., Disp: 90 tablet, Rfl: 1   Multiple Vitamin (MULTIVITAMIN WITH MINERALS) TABS tablet, Take 1 tablet by mouth daily., Disp: , Rfl:    rosuvastatin  (CRESTOR ) 10 MG tablet, Take 1 tablet (10 mg total) by mouth daily., Disp: 90 tablet,  Rfl: 3   telmisartan  (MICARDIS ) 20 MG tablet, Take 1 tablet (20 mg total) by mouth daily., Disp: 90 tablet, Rfl: 1   tirzepatide  (ZEPBOUND ) 10 MG/0.5ML Pen, Inject 10 mg into the skin once a week., Disp: 2 mL, Rfl: 0   Medications ordered in this encounter:  Meds ordered this encounter  Medications   amoxicillin -clavulanate (AUGMENTIN ) 875-125 MG tablet    Sig: Take 1 tablet by mouth 2 (two) times daily.    Dispense:  20 tablet    Refill:  0    Supervising Provider:   LAMPTEY, PHILIP O [[id number]]   fluconazole  (DIFLUCAN ) 150 MG tablet    Sig: Take 1 tablet (150 mg total) by mouth every 3 (three) days as needed.    Dispense:  3 tablet    Refill:  0    Supervising Provider:   LAMPTEY, PHILIP O [[id number]]     *If you need refills on other medications prior to your next appointment, please contact your pharmacy*  Follow-Up: Call back or seek an in-person evaluation if the symptoms worsen or if the condition fails to improve as anticipated.  [facility] [phone]  Other Instructions Sinus Infection, Adult A sinus infection, also called sinusitis, is inflammation of your sinuses. Sinuses are hollow spaces in the bones around your face. Your sinuses are located: Around your eyes. In the middle of your forehead. Behind your nose. In your cheekbones. Mucus normally drains out of your sinuses. When  your nasal tissues become inflamed or swollen, mucus can become trapped or blocked. This allows bacteria, viruses, and fungi to grow, which leads to infection. Most infections of the sinuses are caused by a virus. A sinus infection can develop quickly. It can last for up to 4 weeks (acute) or for more than 12 weeks (chronic). A sinus infection often develops after a cold. What are the causes? This condition is caused by anything that creates swelling in the sinuses or stops mucus from draining. This includes: Allergies. Asthma. Infection from bacteria or viruses. Deformities  or blockages in your nose or sinuses. Abnormal growths in the nose (nasal polyps). Pollutants, such as chemicals or irritants in the air. Infection from fungi. This is rare. What increases the risk? You are more likely to develop this condition if you: Have a weak body defense system (immune system). Do a lot of swimming or diving. Overuse nasal sprays. Smoke. What are the signs or symptoms? The main symptoms of this condition are pain and a feeling of pressure around the affected sinuses. Other symptoms include: Stuffy nose or congestion that makes it difficult to breathe through your nose. Thick yellow or greenish drainage from your nose. Tenderness, swelling, and warmth over the affected sinuses. A cough that may get worse at night. Decreased sense of smell and taste. Extra mucus that collects in the throat or the back of the nose (postnasal drip) causing a sore throat or bad breath. Tiredness (fatigue). Fever. How is this diagnosed? This condition is diagnosed based on: Your symptoms. Your medical history. A physical exam. Tests to find out if your condition is acute or chronic. This may include: Checking your nose for nasal polyps. Viewing your sinuses using a device that has a light (endoscope). Testing for allergies or bacteria. Imaging tests, such as an MRI or CT scan. In rare cases, a bone biopsy may be done to rule out more serious types of fungal sinus disease. How is this treated? Treatment for a sinus infection depends on the cause and whether your condition is chronic or acute. If caused by a virus, your symptoms should go away on their own within 10 days. You may be given medicines to relieve symptoms. They include: Medicines that shrink swollen nasal passages (decongestants). A spray that eases inflammation of the nostrils (topical intranasal corticosteroids). Rinses that help get rid of thick mucus in your nose (nasal saline washes). Medicines that treat  allergies (antihistamines). Over-the-counter pain relievers. If caused by bacteria, your health care provider may recommend waiting to see if your symptoms improve. Most bacterial infections will get better without antibiotic medicine. You may be given antibiotics if you have: A severe infection. A weak immune system. If caused by narrow nasal passages or nasal polyps, surgery may be needed. Follow these instructions at home: Medicines Take, use, or apply over-the-counter and prescription medicines only as told by your health care provider. These may include nasal sprays. If you were prescribed an antibiotic medicine, take it as told by your health care provider. Do not stop taking the antibiotic even if you start to feel better. Hydrate and humidify  Drink enough fluid to keep your urine pale yellow. Staying hydrated will help to thin your mucus. Use a cool mist humidifier to keep the humidity level in your home above 50%. Inhale steam for 10-15 minutes, 3-4 times a day, or as told by your health care provider. You can do this in the bathroom while a hot shower is running. Limit  your exposure to cool or dry air. Rest Rest as much as possible. Sleep with your head raised (elevated). Make sure you get enough sleep each night. General instructions  Apply a warm, moist washcloth to your face 3-4 times a day or as told by your health care provider. This will help with discomfort. Use nasal saline washes as often as told by your health care provider. Wash your hands often with soap and water to reduce your exposure to germs. If soap and water are not available, use hand sanitizer. Do not smoke. Avoid being around people who are smoking (secondhand smoke). Keep all follow-up visits. This is important. Contact a health care provider if: You have a fever. Your symptoms get worse. Your symptoms do not improve within 10 days. Get help right away if: You have a severe headache. You have  persistent vomiting. You have severe pain or swelling around your face or eyes. You have vision problems. You develop confusion. Your neck is stiff. You have trouble breathing. These symptoms may be an emergency. Get help right away. Call 911. Do not wait to see if the symptoms will go away. Do not drive yourself to the hospital. Summary A sinus infection is soreness and inflammation of your sinuses. Sinuses are hollow spaces in the bones around your face. This condition is caused by nasal tissues that become inflamed or swollen. The swelling traps or blocks the flow of mucus. This allows bacteria, viruses, and fungi to grow, which leads to infection. If you were prescribed an antibiotic medicine, take it as told by your health care provider. Do not stop taking the antibiotic even if you start to feel better. Keep all follow-up visits. This is important. This information is not intended to replace advice given to you by your health care provider. Make sure you discuss any questions you have with your health care provider. Document Revised: 11/26/2020 Document Reviewed: 11/26/2020 Elsevier Patient Education  2024 [employer]   If you have been instructed to have an in-person evaluation today at a local Urgent Care facility, please use the link below. It will take you to a list of all of our available [facility], including address, phone number and hours of operation. Please do not delay care.  [facility]  If you or a family member do not have a primary care provider, use the link below to schedule a visit and establish care. When you choose a [facility] primary care physician or advanced practice provider, you gain a long-term partner in health. Find a Primary Care Provider  Learn more about [facility]'s in-office and virtual care options: [facility] Get Care Now

## 2023-07-19 ENCOUNTER — Encounter: Payer: Self-pay | Admitting: Family Medicine

## 2023-07-27 MED ORDER — ZEPBOUND 12.5 MG/0.5ML ~~LOC~~ SOAJ: 12.5000 mg | SUBCUTANEOUS | 0 refills | Status: DC

## 2023-08-09 ENCOUNTER — Telehealth: Admitting: Family Medicine

## 2023-08-09 ENCOUNTER — Encounter

## 2023-08-09 ENCOUNTER — Encounter: Payer: Self-pay | Admitting: Family Medicine

## 2023-08-09 VITALS — Temp 98.8°F

## 2023-08-09 DIAGNOSIS — U071 COVID-19: Secondary | ICD-10-CM | POA: Diagnosis not present

## 2023-08-09 MED ORDER — NIRMATRELVIR/RITONAVIR (PAXLOVID)TABLET: 3.0000 | ORAL_TABLET | Freq: Two times a day (BID) | ORAL | 0 refills | Status: AC

## 2023-08-09 NOTE — Patient Instructions (Signed)
 Coricidin HBP for symptoms  Mucinex DM  Benadryl  25-50 mg at night as needed for sinus congestion
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
## 2023-08-22 ENCOUNTER — Other Ambulatory Visit: Payer: Self-pay | Admitting: Internal Medicine

## 2023-08-23 ENCOUNTER — Other Ambulatory Visit: Payer: Self-pay | Admitting: Internal Medicine

## 2023-08-24 ENCOUNTER — Telehealth: Payer: Self-pay | Admitting: *Deleted

## 2023-08-24 MED ORDER — ROSUVASTATIN CALCIUM 10 MG PO TABS: 10.0000 mg | ORAL_TABLET | Freq: Every day | ORAL | 0 refills | Status: DC

## 2023-08-24 NOTE — Telephone Encounter (Signed)
 Ok to refill

## 2023-08-24 NOTE — Telephone Encounter (Signed)
 [facility] faxed a refill request for Rosuvastatin  10mg .  Message sent to Dr Ozell  as last Rx was given by former PCP.

## 2023-08-24 NOTE — Telephone Encounter (Signed)
 Rx done.

## 2023-09-03 ENCOUNTER — Other Ambulatory Visit (HOSPITAL_COMMUNITY): Payer: Self-pay

## 2023-09-03 ENCOUNTER — Telehealth: Payer: Self-pay

## 2023-09-03 NOTE — Telephone Encounter (Signed)
 Insurance companies are becoming increasingly stricter about requiring thorough documentation of lifestyle modifications in the patient's chart at each visit. This includes detailed records of diet recommendations (caloric intake, etc), exercise plans (amount of time/wk, etc), and an emphasis on the patient's commitment to continuing these efforts while on medication.  Without this additional documentation in the chart notes, a prior authorization will most likely be denied.    Patient last weight and BMI were taken 03/29/23.  We will need a new weight and BMI within 45 days of PA request.

## 2023-09-07 NOTE — Telephone Encounter (Signed)
 I sent a message to the patient over mychart with this information. Hopefully we can get her in on time.

## 2023-09-07 NOTE — Telephone Encounter (Signed)
 Noted

## 2023-09-27 ENCOUNTER — Other Ambulatory Visit: Payer: Self-pay | Admitting: Family Medicine

## 2023-09-27 NOTE — Telephone Encounter (Signed)
 Does she want to increase to the 15 mg? Or stay on the 12.5mg ?

## 2023-10-04 ENCOUNTER — Telehealth: Payer: Self-pay | Admitting: *Deleted

## 2023-10-04 MED ORDER — METOPROLOL SUCCINATE ER 50 MG PO TB24: 50.0000 mg | ORAL_TABLET | Freq: Every day | ORAL | 0 refills | Status: DC

## 2023-10-04 NOTE — Telephone Encounter (Signed)
 Rx previously sent and received by the pharmacy today at 10:24am.

## 2023-10-04 NOTE — Telephone Encounter (Signed)
 Rx done.

## 2023-10-04 NOTE — Telephone Encounter (Signed)
 Copied from CRM #[id number]. Topic: Clinical - Prescription Issue >> Oct 04, 2023  9:57 AM Rea ORN wrote: Reason for CRM: Norlene called to advise rx request was faxed to the office 9/16, 9/19, 9/25, and 9/27 metoprolol  succinate (TOPROL -XL) 50 MG 24 hr tablet and they have not received a response. The pt was already been given an emergency supply of this med. Please advise when this will be sent back to the pharmacy as the pt is completely out.

## 2023-10-06 ENCOUNTER — Ambulatory Visit: Admitting: Family Medicine

## 2023-10-07 ENCOUNTER — Telehealth: Payer: Self-pay

## 2023-10-07 ENCOUNTER — Other Ambulatory Visit (HOSPITAL_COMMUNITY): Payer: Self-pay

## 2023-10-07 NOTE — Telephone Encounter (Signed)
 Pharmacy Patient Advocate Encounter   Received notification from Onbase that prior authorization for Zepbound  12.5 is required/requested.   Insurance verification completed.   The patient is insured through [facility]   Per test claim: Insurance companies are becoming increasingly stricter about requiring thorough documentation of lifestyle modifications in the patient's chart at each visit. This includes detailed records of diet recommendations (caloric intake, etc), exercise plans (amount of time/wk, etc), and an emphasis on the patient's commitment to continuing these efforts while on medication.  Without this additional documentation in the chart notes, a prior authorization will most likely be denied.  Still need new weight and BMI checks. Please see encounter 09/03/23

## 2023-10-08 NOTE — Telephone Encounter (Signed)
 Pt has appt on 10/8 -- she MUST give us  a weight or she will lose the medication, ok to close task.

## 2023-10-11 ENCOUNTER — Other Ambulatory Visit (HOSPITAL_COMMUNITY): Payer: Self-pay

## 2023-10-13 ENCOUNTER — Telehealth: Admitting: Physician Assistant

## 2023-10-13 ENCOUNTER — Ambulatory Visit: Admitting: Family Medicine

## 2023-10-13 ENCOUNTER — Other Ambulatory Visit (HOSPITAL_COMMUNITY): Payer: Self-pay

## 2023-10-13 DIAGNOSIS — M25521 Pain in right elbow: Secondary | ICD-10-CM | POA: Diagnosis not present

## 2023-10-13 MED ORDER — NAPROXEN 500 MG PO TABS: 500.0000 mg | ORAL_TABLET | Freq: Two times a day (BID) | ORAL | 0 refills | Status: AC

## 2023-10-13 NOTE — Progress Notes (Signed)
 Virtual Visit Consent   Sara Hunt, you are scheduled for a virtual visit with a [facility] provider today. Just as with appointments in the office, your consent must be obtained to participate. Your consent will be active for this visit and any virtual visit you may have with one of our providers in the next 365 days. If you have a MyChart account, a copy of this consent can be sent to you electronically.  As this is a virtual visit, video technology does not allow for your provider to perform a traditional examination. This may limit your provider's ability to fully assess your condition. If your provider identifies any concerns that need to be evaluated in person or the need to arrange testing (such as labs, EKG, etc.), we will make arrangements to do so. Although advances in technology are sophisticated, we cannot ensure that it will always work on either your end or our end. If the connection with a video visit is poor, the visit may have to be switched to a telephone visit. With either a video or telephone visit, we are not always able to ensure that we have a secure connection.  By engaging in this virtual visit, you consent to the provision of healthcare and authorize for your insurance to be billed (if applicable) for the services provided during this visit. Depending on your insurance coverage, you may receive a charge related to this service.  I need to obtain your verbal consent now. Are you willing to proceed with your visit today? Sara Hunt has provided verbal consent on 10/13/2023 for a virtual visit (video or telephone). Sara Hunt, [place]  Date: 10/13/2023 3:19 PM   Virtual Visit via Video Note   I, Sara Hunt, connected with  MILAGRO BELMARES  ([id number], 1975-09-14) on 10/13/23 at  3:15 PM EDT by a video-enabled telemedicine application and verified that I am speaking with the correct person using two identifiers.  Location: Patient: Virtual Visit Location  Patient: Home Provider: Virtual Visit Location Provider: Home Office   I discussed the limitations of evaluation and management by telemedicine and the availability of in person appointments. The patient expressed understanding and agreed to proceed.    History of Present Illness: Sara Hunt is a 48 y.o. who identifies as a female who was assigned female at birth, and is being seen today for R arm/elbow pain after some heavy lifting this past weekend. Notes while lifting something she bumped her elbow which caused the pain. DEnies any swelling, redness or bruising. Has history of fracture in her R elbow and ever since then occasionally will get a flare up of pain in the joint. Typically requires rest and short script of Naproxen  to alleviate which she is requesting.   HPI: HPI  Problems:  Patient Active Problem List   Diagnosis Date Noted   Allergic rhinitis due to allergen 05/30/2021   Other fatigue 01/07/2018   Essential hypertension    Neurodegenerative gait disorder 09/28/2012   PSVT (paroxysmal supraventricular tachycardia)    Routine health maintenance 02/27/2011   Vitamin D  deficiency 09/07/2010   Morbid obesity (HCC) 10/15/2009   Hyperlipidemia 07/12/2009    Allergies:  Allergies  Allergen Reactions   Latex Rash   Medications:  Current Outpatient Medications:    naproxen  (NAPROSYN ) 500 MG tablet, Take 1 tablet (500 mg total) by mouth 2 (two) times daily with a meal., Disp: 30 tablet, Rfl: 0   ibuprofen  (ADVIL ,MOTRIN ) 600 MG tablet, Take 1  tablet (600 mg total) by mouth every 6 (six) hours as needed for moderate pain., Disp: 30 tablet, Rfl: 0   Menthol , Topical Analgesic, (BENGAY EX), Apply 1 application topically daily as needed (muscle pain)., Disp: , Rfl:    metoprolol  succinate (TOPROL -XL) 50 MG 24 hr tablet, Take 1 tablet (50 mg total) by mouth daily. Take with or immediately following a meal., Disp: 30 tablet, Rfl: 0   Multiple Vitamin (MULTIVITAMIN WITH MINERALS)  TABS tablet, Take 1 tablet by mouth daily., Disp: , Rfl:    telmisartan  (MICARDIS ) 20 MG tablet, Take 1 tablet (20 mg total) by mouth daily., Disp: 90 tablet, Rfl: 1   tirzepatide  (ZEPBOUND ) 10 MG/0.5ML Pen, Inject 10 mg into the skin once a week., Disp: 2 mL, Rfl: 0  Observations/Objective: Patient is well-developed, well-nourished in no acute distress.  Resting comfortably at home.  Head is normocephalic, atraumatic.  No labored breathing.  Speech is clear and coherent with logical content.  Patient is alert and oriented at baseline.   Assessment and Plan: 1. Right elbow pain (Primary) - naproxen  (NAPROSYN ) 500 MG tablet; Take 1 tablet (500 mg total) by mouth 2 (two) times daily with a meal.  Dispense: 30 tablet; Refill: 0  Flare up over past couple of days. No alarm signs or symptoms present. ROM preserved. Will give short script for Naproxen  which she has used for this issue in the past. Elevate and ice the area. Follow-up in person for any non-resolving, new or worsening symptoms despite treatment.   Follow Up Instructions: I discussed the assessment and treatment plan with the patient. The patient was provided an opportunity to ask questions and all were answered. The patient agreed with the plan and demonstrated an understanding of the instructions.  A copy of instructions were sent to the patient via MyChart unless otherwise noted below.   The patient was advised to call back or seek an in-person evaluation if the symptoms worsen or if the condition fails to improve as anticipated.    Sara Velma Lunger, PA-C

## 2023-10-13 NOTE — Patient Instructions (Signed)
 Ronal FORBES Fonder, thank you for joining [name], PA-C for today's virtual visit.  While this provider is not your primary care provider (PCP), if your PCP is located in our provider database this encounter information will be shared with them immediately following your visit.   A [facility] MyChart account gives you access to today's visit and all your visits, tests, and labs performed at [facility]  click here if you don't have a [facility] MyChart account or go to [email or web address]  Consent: (Patient) Ronal FORBES Fonder provided verbal consent for this virtual visit at the beginning of the encounter.  Current Medications:  Current Outpatient Medications:    amoxicillin -clavulanate (AUGMENTIN ) 875-125 MG tablet, Take 1 tablet by mouth 2 (two) times daily., Disp: 20 tablet, Rfl: 0   cyclobenzaprine  (FLEXERIL ) 10 MG tablet, Take 1 tablet (10 mg total) by mouth 3 (three) times daily as needed., Disp: 15 tablet, Rfl: 0   ibuprofen  (ADVIL ,MOTRIN ) 600 MG tablet, Take 1 tablet (600 mg total) by mouth every 6 (six) hours as needed for moderate pain., Disp: 30 tablet, Rfl: 0   meloxicam  (MOBIC ) 15 MG tablet, Take 1 tablet (15 mg total) by mouth daily as needed., Disp: 30 tablet, Rfl: 1   Menthol , Topical Analgesic, (BENGAY EX), Apply 1 application topically daily as needed (muscle pain)., Disp: , Rfl:    metoprolol  succinate (TOPROL -XL) 50 MG 24 hr tablet, Take 1 tablet (50 mg total) by mouth daily. Take with or immediately following a meal., Disp: 30 tablet, Rfl: 0   Multiple Vitamin (MULTIVITAMIN WITH MINERALS) TABS tablet, Take 1 tablet by mouth daily., Disp: , Rfl:    rosuvastatin  (CRESTOR ) 10 MG tablet, Take 1 tablet (10 mg total) by mouth daily., Disp: 90 tablet, Rfl: 3   rosuvastatin  (CRESTOR ) 10 MG tablet, Take 1 tablet (10 mg total) by mouth daily., Disp: 90 tablet, Rfl: 0   telmisartan  (MICARDIS ) 20 MG tablet, Take 1 tablet (20 mg total) by mouth daily., Disp: 90  tablet, Rfl: 1   tirzepatide  (ZEPBOUND ) 10 MG/0.5ML Pen, Inject 10 mg into the skin once a week., Disp: 2 mL, Rfl: 0   tirzepatide  (ZEPBOUND ) 12.5 MG/0.5ML Pen, INJECT 12.5 MG INTO THE SKIN ONCE A WEEK **FOR THE NEXT FILL FOR PATIENT AFTER THE 10 MG IS COMPLETED**, Disp: 2 mL, Rfl: 2   Medications ordered in this encounter:  No orders of the defined types were placed in this encounter.    *If you need refills on other medications prior to your next appointment, please contact your pharmacy*  Follow-Up: Call back or seek an in-person evaluation if the symptoms worsen or if the condition fails to improve as anticipated.  [facility] [phone]  Other Instructions Ice and elevate the arm while resting. Continue use of compression sleeve. Start the Naproxen  as directed with food. If you note any non-resolving, new, or worsening symptoms despite treatment, please seek an in-person evaluation ASAP.    If you have been instructed to have an in-person evaluation today at a local Urgent Care facility, please use the link below. It will take you to a list of all of our available [facility], including address, phone number and hours of operation. Please do not delay care.  [facility]  If you or a family member do not have a primary care provider, use the link below to schedule a visit and establish care. When you choose a [facility] primary care physician  or advanced practice provider, you gain a long-term partner in health. Find a Primary Care Provider  Learn more about [facility]'s in-office and virtual care options: [facility] - Get Care Now

## 2023-10-15 NOTE — Telephone Encounter (Signed)
 Noted ok to close, we will re-authorize the medication once she comes in

## 2023-10-19 ENCOUNTER — Encounter: Payer: Self-pay | Admitting: Family Medicine

## 2023-10-19 ENCOUNTER — Ambulatory Visit: Admitting: Family Medicine

## 2023-10-19 VITALS — BP 143/83 | HR 84 | Temp 98.2°F | Wt 239.9 lb

## 2023-10-19 DIAGNOSIS — E782 Mixed hyperlipidemia: Secondary | ICD-10-CM

## 2023-10-19 DIAGNOSIS — I10 Essential (primary) hypertension: Secondary | ICD-10-CM | POA: Diagnosis not present

## 2023-10-19 DIAGNOSIS — E559 Vitamin D deficiency, unspecified: Secondary | ICD-10-CM | POA: Diagnosis not present

## 2023-10-19 LAB — COMPREHENSIVE METABOLIC PANEL WITH GFR
ALT: 19 U/L (ref 0–35)
AST: 19 U/L (ref 0–37)
Albumin: 3.8 g/dL (ref 3.5–5.2)
Alkaline Phosphatase: 66 U/L (ref 39–117)
BUN: 13 mg/dL (ref 6–23)
CO2: 27 meq/L (ref 19–32)
Calcium: 9.7 mg/dL (ref 8.4–10.5)
Chloride: 104 meq/L (ref 96–112)
Creatinine, Ser: 0.72 mg/dL (ref 0.40–1.20)
GFR: 99.47 mL/min (ref >60.00)
Glucose, Bld: 87 mg/dL (ref 70–99)
Potassium: 3.7 meq/L (ref 3.5–5.1)
Sodium: 136 meq/L (ref 135–145)
Total Bilirubin: 0.5 mg/dL (ref 0.2–1.2)
Total Protein: 7.6 g/dL (ref 6.0–8.3)

## 2023-10-19 LAB — LIPID PANEL
Cholesterol: 171 mg/dL (ref 0–200)
HDL: 36.6 mg/dL — ABNORMAL LOW (ref >39.00)
LDL Cholesterol: 114 mg/dL — ABNORMAL HIGH (ref 0–99)
NonHDL: 134.82
Total CHOL/HDL Ratio: 5
Triglycerides: 104 mg/dL (ref 0.0–149.0)
VLDL: 20.8 mg/dL (ref 0.0–40.0)

## 2023-10-19 LAB — VITAMIN D 25 HYDROXY (VIT D DEFICIENCY, FRACTURES): VITD: 14.11 ng/mL — ABNORMAL LOW (ref 30.00–100.00)

## 2023-10-19 LAB — HEMOGLOBIN A1C: Hgb A1c MFr Bld: 5.1 % (ref 4.6–6.5)

## 2023-10-19 MED ORDER — ZEPBOUND 15 MG/0.5ML ~~LOC~~ SOAJ: 15.0000 mg | SUBCUTANEOUS | 3 refills | Status: DC

## 2023-10-19 NOTE — Progress Notes (Unsigned)
 Established Patient Office Visit  Subjective   Patient ID: Sara Hunt, female    DOB: 1975-06-30  Age: 48 y.o. MRN: [id number]  Chief Complaint  Patient presents with   Medical Management of Chronic Issues    HPI Discussed the use of AI scribe software for clinical note transcription with the patient, who gave verbal consent to proceed.  History of Present Illness   Sara Hunt is a 48 year old female who presents for follow-up on weight management with tirzepatide  therapy.  She started tirzepatide  therapy in April 2025, initially at a lower dose, and has been titrated up to 12.5 mg. Appetite suppression was significant at 7.5 mg, and she currently feels full after minimal meals, typically eating one meal a day. She has lost 7 pounds since starting the medication, from 246 pounds to 239 pounds.  She experiences more frequent and looser bowel movements since the dose increase, without nausea or constipation. She has not been exercising and has increased consumption of fried foods due to recent personal stressors, including a death in the family and job-related stress.  Her current medications include tirzepatide  at 12.5 mg and Telmisartan  for blood pressure. She has not been monitoring her blood pressure at home due to anxiety but continues her medication. 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       Current Outpatient Medications  Medication Instructions   ibuprofen  (ADVIL ) 600 mg, Oral, Every 6 hours PRN   Menthol , Topical Analgesic, (BENGAY EX) 1 application , Daily PRN   metoprolol  succinate (TOPROL -XL) 50 mg, Oral, Daily, Take with or immediately following a meal.   Multiple Vitamin (MULTIVITAMIN WITH MINERALS) TABS tablet 1 tablet, Daily   naproxen  (NAPROSYN ) 500 mg, Oral, 2 times daily with meals   telmisartan  (MICARDIS ) 20 mg, Oral, Daily   Zepbound  15 mg,  Subcutaneous, Weekly    Patient Active Problem List   Diagnosis Date Noted   Allergic rhinitis due to allergen 05/30/2021   Other fatigue 01/07/2018   Essential hypertension    Neurodegenerative gait disorder 09/28/2012   PSVT (paroxysmal supraventricular tachycardia)    Routine health maintenance 02/27/2011   Vitamin D  deficiency 09/07/2010   Morbid obesity (HCC) 10/15/2009   Hyperlipidemia 07/12/2009     Review of Systems  All other systems reviewed and are negative.     Objective:     BP (!) 143/83 (BP Location: Left Arm, Patient Position: Sitting, Cuff Size: Large)   Pulse 84   Temp 98.2 F (36.8 C) (Oral)   Wt 239 lb 14.4 oz (108.8 kg)   LMP 10/20/2017 (Exact Date)   SpO2 99%   BMI 38.72 kg/m    Physical Exam Vitals reviewed.  Constitutional:      Appearance: Normal appearance. She is obese.  Cardiovascular:     Rate and Rhythm: Normal rate and regular rhythm.     Heart sounds: Normal heart sounds. No murmur heard. Pulmonary:     Effort: Pulmonary effort is normal.  Neurological:     Mental Status: She is alert and oriented to person, place, and time. Mental status is at baseline.  Psychiatric:        Mood and Affect: Mood normal.        Behavior: Behavior normal.      No results found for any visits on 10/19/23.    The 10-year ASCVD risk score (Arnett DK, et  al., 2019) is: 10.2%    Assessment & Plan:  Essential hypertension -     Comprehensive metabolic panel with GFR; Future  Morbid obesity (HCC) -     Zepbound ; Inject 15 mg into the skin once a week.  Dispense: 6 mL; Refill: 3 -     Hemoglobin A1c; Future  Vitamin D  deficiency -     VITAMIN D  25 Hydroxy (Vit-D Deficiency, Fractures); Future  Mixed hyperlipidemia -     Lipid panel; Future   Assessment and Plan    Obesity Obesity management with tirzepatide  (Zepbound ) has resulted in a weight loss of 7 pounds over approximately 4 months. She has been on a therapeutic dose of 12.5 mg,  with plans to increase to 15 mg. Appetite suppression is noted, though recent stress and dietary changes have impacted weight management. Reports more frequent bowel movements as a side effect, which is considered normal. Emphasized the importance of reaching therapeutic levels for effective weight loss and encouraged continuation of the medication despite recent challenges. Explained that early doses are loading doses and not therapeutic, and that therapeutic effects are expected at 12.5 mg and 15 mg doses. - Patient will continue to engage in the previously documented calorie and carb restriction, she will continue to exercise to the best of her ability, given her difficulties with neurodegenerative gait disorder.  - Increase tirzepatide  (Zepbound ) to 15 mg. - Order labs today. - Recommend [facility] for exercise options. - Consider referral to physical therapy for exercise guidance.  Hypertension Blood pressure reading today was 143/83 mmHg, which is higher than desired but not alarming. Stress and recent dietary habits may have contributed to this elevation. She is on Telmisartan  and has not been monitoring blood pressure at home due to anxiety about the readings. Reassured that with weight loss, blood pressure is expected to improve. - Encourage home blood pressure monitoring.        Return in about 4 months (around 02/19/2024) for weight loss.    Heron CHRISTELLA Sharper, MD

## 2023-10-20 ENCOUNTER — Telehealth: Payer: Self-pay

## 2023-10-20 ENCOUNTER — Other Ambulatory Visit (HOSPITAL_COMMUNITY): Payer: Self-pay

## 2023-10-20 NOTE — Telephone Encounter (Signed)
 Pharmacy Patient Advocate Encounter   Received notification from Onbase that prior authorization for Zepbound  15 is required/requested.   Insurance verification completed.   The patient is insured through [facility].   Per test claim: PA required; PA submitted to above mentioned insurance via Latent Key/confirmation #/EOC [id number] Status is pending

## 2023-10-20 NOTE — Telephone Encounter (Signed)
 Pharmacy Patient Advocate Encounter  Received notification from [employer] that Prior Authorization for Zepbound  15 has been APPROVED from 10/20/23 to 10/19/24. Ran test claim, Copay is $24.99. This test claim was processed through [facility]- copay amounts may vary at other pharmacies due to pharmacy/plan contracts, or as the patient moves through the different stages of their insurance plan.   PA #/Case ID/Reference #: # [id number]

## 2023-10-21 NOTE — Telephone Encounter (Signed)
 Left a detailed message with the approval information below at the pharmacy voicemail.

## 2023-10-23 ENCOUNTER — Other Ambulatory Visit: Payer: Self-pay | Admitting: Family Medicine

## 2023-10-23 DIAGNOSIS — I10 Essential (primary) hypertension: Secondary | ICD-10-CM

## 2023-10-26 ENCOUNTER — Ambulatory Visit: Payer: Self-pay | Admitting: Family Medicine

## 2023-10-26 DIAGNOSIS — E559 Vitamin D deficiency, unspecified: Secondary | ICD-10-CM

## 2023-10-26 MED ORDER — VITAMIN D (ERGOCALCIFEROL) 1.25 MG (50000 UNIT) PO CAPS: 50000.0000 [IU] | ORAL_CAPSULE | ORAL | 1 refills | Status: AC

## 2023-11-12 ENCOUNTER — Other Ambulatory Visit: Payer: Self-pay | Admitting: Family Medicine

## 2023-11-22 ENCOUNTER — Other Ambulatory Visit: Payer: Self-pay | Admitting: Family Medicine

## 2023-11-22 NOTE — Telephone Encounter (Signed)
 Message sent to PCP as Rx not on the current med list.

## 2024-01-14 ENCOUNTER — Other Ambulatory Visit: Payer: Self-pay | Admitting: Family Medicine

## 2024-01-25 ENCOUNTER — Other Ambulatory Visit: Payer: Self-pay | Admitting: Family Medicine

## 2024-01-25 DIAGNOSIS — I10 Essential (primary) hypertension: Secondary | ICD-10-CM

## 2024-02-01 ENCOUNTER — Telehealth

## 2024-02-01 DIAGNOSIS — B3731 Acute candidiasis of vulva and vagina: Secondary | ICD-10-CM | POA: Insufficient documentation

## 2024-02-01 DIAGNOSIS — B372 Candidiasis of skin and nail: Secondary | ICD-10-CM | POA: Insufficient documentation

## 2024-02-01 MED ORDER — NYSTATIN-TRIAMCINOLONE 100000-0.1 UNIT/GM-% EX OINT: 1.0000 | TOPICAL_OINTMENT | Freq: Two times a day (BID) | CUTANEOUS | 0 refills | Status: AC

## 2024-02-01 MED ORDER — FLUCONAZOLE 150 MG PO TABS: 150.0000 mg | ORAL_TABLET | Freq: Once | ORAL | 0 refills | Status: AC

## 2024-02-01 NOTE — Patient Instructions (Addendum)
 " Sara Hunt, thank you for joining [name], NP for today's virtual visit.  While this provider is not your primary care provider (PCP), if your PCP is located in our provider database this encounter information will be shared with them immediately following your visit.   A [facility] MyChart account gives you access to today's visit and all your visits, tests, and labs performed at [facility]  click here if you don't have a [facility] MyChart account or go to [email or web address]  Consent: (Patient) Sara Hunt provided verbal consent for this virtual visit at the beginning of the encounter.  Current Medications:  Current Outpatient Medications:    fluconazole  (DIFLUCAN ) 150 MG tablet, Take 1 tablet (150 mg total) by mouth once for 1 dose. May repeat in 72 hours if symptoms persist, Disp: 2 tablet, Rfl: 0   metoprolol  succinate (TOPROL -XL) 50 MG 24 hr tablet, TAKE 1 TABLET BY MOUTH ONCE DAILY. TAKE WITH OR IMMEDIATELY FOLLOWING A MEAL., Disp: 90 tablet, Rfl: 0   Multiple Vitamin (MULTIVITAMIN WITH MINERALS) TABS tablet, Take 1 tablet by mouth daily., Disp: , Rfl:    naproxen  (NAPROSYN ) 500 MG tablet, Take 1 tablet (500 mg total) by mouth 2 (two) times daily with a meal., Disp: 30 tablet, Rfl: 0   nystatin -triamcinolone  ointment (MYCOLOG), Apply 1 Application topically 2 (two) times daily. To affected areas until symptoms resolved, Disp: 60 g, Rfl: 0   rosuvastatin  (CRESTOR ) 10 MG tablet, Take 1 tablet by mouth once daily, Disp: 90 tablet, Rfl: 0   telmisartan  (MICARDIS ) 20 MG tablet, Take 1 tablet by mouth once daily, Disp: 90 tablet, Rfl: 1   Vitamin D , Ergocalciferol , (DRISDOL ) 1.25 MG (50000 UNIT) CAPS capsule, Take 1 capsule (50,000 Units total) by mouth every 7 (seven) days., Disp: 12 capsule, Rfl: 1   tirzepatide  (ZEPBOUND ) 15 MG/0.5ML Pen, Inject 15 mg into the skin once a week., Disp: 6 mL, Rfl: 3   Medications ordered in this encounter:  Meds ordered  this encounter  Medications   nystatin -triamcinolone  ointment (MYCOLOG)    Sig: Apply 1 Application topically 2 (two) times daily. To affected areas until symptoms resolved    Dispense:  60 g    Refill:  0   fluconazole  (DIFLUCAN ) 150 MG tablet    Sig: Take 1 tablet (150 mg total) by mouth once for 1 dose. May repeat in 72 hours if symptoms persist    Dispense:  2 tablet    Refill:  0     *If you need refills on other medications prior to your next appointment, please contact your pharmacy*  Follow-Up: Call back or seek an in-person evaluation if the symptoms worsen or if the condition fails to improve as anticipated.  [facility] [phone]  Other Instructions You have been prescribed a topical cream, nystatin /triamcinolone , to apply twice daily to affected skin are until symptoms improve. This should not be placed into the vagina and only used on external skin areas. You were also given diflucan  (fluconazole ) which is an antifungal that treats yeast infections. You should take 1 tablet once and can repeat in 72 hours if symptoms fail to improve. If the symptoms do not resolve or you develop fevers, open wounds or urinary symptoms, ensure you are seen in person for further evaluation. Do not use any scented soaps or lotions to the area. Cleanse daily with mild, unscented cleanser and dry well with a hair dryer on cool setting. You can use  a barrier cream over top of the prescription cream as well. Ensure you are minimizing moisture and friction to the areas as well.    If you have been instructed to have an in-person evaluation today at a local Urgent Care facility, please use the link below. It will take you to a list of all of our available [facility], including address, phone number and hours of operation. Please do not delay care.  [facility]  If you or a family member do not have a primary care provider, use the link below to schedule a  visit and establish care. When you choose a [facility] primary care physician or advanced practice provider, you gain a long-term partner in health. Find a Primary Care Provider  Learn more about [facility]'s in-office and virtual care options:  - Get Care Now  "

## 2024-02-01 NOTE — Assessment & Plan Note (Addendum)
 Empiric treatment based on symptoms. No visualization of affected area. Reviewed red flag symptoms and symptoms necessitating in person evaluation. Aware to seek further care if symptoms fail to improve or worsen. Side effect profiles and administration education provided. Verbalized understanding. Reviewed hygiene/moisture control measures.

## 2024-02-01 NOTE — Progress Notes (Signed)
 " Virtual Visit Consent   Sara Hunt, you are scheduled for a virtual visit with a [facility] provider today. Just as with appointments in the office, your consent must be obtained to participate. Your consent will be active for this visit and any virtual visit you may have with one of our providers in the next 365 days. If you have a MyChart account, a copy of this consent can be sent to you electronically.  As this is a virtual visit, video technology does not allow for your provider to perform a traditional examination. This may limit your provider's ability to fully assess your condition. If your provider identifies any concerns that need to be evaluated in person or the need to arrange testing (such as labs, EKG, etc.), we will make arrangements to do so. Although advances in technology are sophisticated, we cannot ensure that it will always work on either your end or our end. If the connection with a video visit is poor, the visit may have to be switched to a telephone visit. With either a video or telephone visit, we are not always able to ensure that we have a secure connection.  By engaging in this virtual visit, you consent to the provision of healthcare and authorize for your insurance to be billed (if applicable) for the services provided during this visit. Depending on your insurance coverage, you may receive a charge related to this service.  I need to obtain your verbal consent now. Are you willing to proceed with your visit today? Sara Hunt has provided verbal consent on 02/01/2024 for a virtual visit (video or telephone). Sara LULLA Rouleau, NP  Date: 02/01/2024 10:01 AM   Virtual Visit via Video Note   I, Sara Hunt, connected with  Sara Hunt  ([id number], Nov 10, 1975) on 02/01/24 at  9:30 AM EST by a video-enabled telemedicine application and verified that I am speaking with the correct person using two identifiers.  Location: Patient: Virtual Visit Location Patient:  Home Provider: Virtual Visit Location Provider: Home Office   I discussed the limitations of evaluation and management by telemedicine and the availability of in person appointments. The patient expressed understanding and agreed to proceed.    History of Present Illness: Sara Hunt is a 49 y.o. who identifies as a female who was assigned female at birth, and is being seen today for possible yeast infection.  Pt is wheelchair bound and has difficulties with moisture to the groin/private area from time to time. No recent infections up until now requiring topical medications, abx or antifungals. She typically sees her GYN for symptoms and does have a prior rx for nystatin /triamcinolone .  Symptoms of yeast infection - vaginal and surrounding groin area  She has burning and did have some itching which has resolved with use of the topical ointment.  No odor, fevers, pustules/blisters/open wounds, drainage/bleeding. No urinary symptoms. No significant vaginal discharge, pelvic pain. Not pregnant.    NKDA.  HPI: HPI  Problems:  Patient Active Problem List   Diagnosis Date Noted   Vaginal candidiasis 02/01/2024   Candidiasis, intertrigo 02/01/2024   Allergic rhinitis due to allergen 05/30/2021   Other fatigue 01/07/2018   Essential hypertension    Neurodegenerative gait disorder 09/28/2012   PSVT (paroxysmal supraventricular tachycardia)    Routine health maintenance 02/27/2011   Vitamin D  deficiency 09/07/2010   Morbid obesity (HCC) 10/15/2009   Hyperlipidemia 07/12/2009    Allergies: Allergies[1] Medications: Current Medications[2]  Observations/Objective: Patient is well-developed,  well-nourished in no acute distress.  Resting comfortably  at home.  Head is normocephalic, atraumatic.  No labored breathing.  Speech is clear and coherent with logical content.  Patient is alert and oriented at baseline.    Assessment and Plan: 1. Vaginal candidiasis (Primary) - fluconazole   (DIFLUCAN ) 150 MG tablet; Take 1 tablet (150 mg total) by mouth once for 1 dose. May repeat in 72 hours if symptoms persist  Dispense: 2 tablet; Refill: 0  2. Candidiasis, intertrigo - nystatin -triamcinolone  ointment (MYCOLOG); Apply 1 Application topically 2 (two) times daily. To affected areas until symptoms resolved  Dispense: 60 g; Refill: 0 - fluconazole  (DIFLUCAN ) 150 MG tablet; Take 1 tablet (150 mg total) by mouth once for 1 dose. May repeat in 72 hours if symptoms persist  Dispense: 2 tablet; Refill: 0  Vaginal candidiasis Empiric treatment based on symptoms. No visualization of affected area. Reviewed red flag symptoms and symptoms necessitating in person evaluation. Aware to seek further care if symptoms fail to improve or worsen. Side effect profiles and administration education provided. Verbalized understanding. Reviewed hygiene/moisture control measures.   Candidiasis, intertrigo See above   Follow Up Instructions: I discussed the assessment and treatment plan with the patient. The patient was provided an opportunity to ask questions and all were answered. The patient agreed with the plan and demonstrated an understanding of the instructions.  A copy of instructions were sent to the patient via MyChart unless otherwise noted below.   The patient was advised to call back or seek an in-person evaluation if the symptoms worsen or if the condition fails to improve as anticipated.    Sara LULLA Rouleau, NP     [1]  Allergies Allergen Reactions   Latex Rash  [2]  Current Outpatient Medications:    fluconazole  (DIFLUCAN ) 150 MG tablet, Take 1 tablet (150 mg total) by mouth once for 1 dose. May repeat in 72 hours if symptoms persist, Disp: 2 tablet, Rfl: 0   metoprolol  succinate (TOPROL -XL) 50 MG 24 hr tablet, TAKE 1 TABLET BY MOUTH ONCE DAILY. TAKE WITH OR IMMEDIATELY FOLLOWING A MEAL., Disp: 90 tablet, Rfl: 0   Multiple Vitamin (MULTIVITAMIN WITH MINERALS) TABS tablet, Take 1  tablet by mouth daily., Disp: , Rfl:    naproxen  (NAPROSYN ) 500 MG tablet, Take 1 tablet (500 mg total) by mouth 2 (two) times daily with a meal., Disp: 30 tablet, Rfl: 0   nystatin -triamcinolone  ointment (MYCOLOG), Apply 1 Application topically 2 (two) times daily. To affected areas until symptoms resolved, Disp: 60 g, Rfl: 0   rosuvastatin  (CRESTOR ) 10 MG tablet, Take 1 tablet by mouth once daily, Disp: 90 tablet, Rfl: 0   telmisartan  (MICARDIS ) 20 MG tablet, Take 1 tablet by mouth once daily, Disp: 90 tablet, Rfl: 1   Vitamin D , Ergocalciferol , (DRISDOL ) 1.25 MG (50000 UNIT) CAPS capsule, Take 1 capsule (50,000 Units total) by mouth every 7 (seven) days., Disp: 12 capsule, Rfl: 1   tirzepatide  (ZEPBOUND ) 15 MG/0.5ML Pen, Inject 15 mg into the skin once a week., Disp: 6 mL, Rfl: 3  "

## 2024-02-01 NOTE — Assessment & Plan Note (Signed)
 See above  See above.
